# Patient Record
Sex: Female | Born: 1985 | Hispanic: No | Marital: Married | Smoking: Former smoker
Health system: Southern US, Community
[De-identification: ages and names within clinical notes are randomized; demographics above are authoritative.]

## PROBLEM LIST (undated history)

## (undated) ENCOUNTER — Inpatient Hospital Stay (HOSPITAL_COMMUNITY): Payer: Self-pay

## (undated) DIAGNOSIS — I7300 Raynaud's syndrome without gangrene: Secondary | ICD-10-CM

## (undated) DIAGNOSIS — E042 Nontoxic multinodular goiter: Secondary | ICD-10-CM

## (undated) DIAGNOSIS — G5603 Carpal tunnel syndrome, bilateral upper limbs: Secondary | ICD-10-CM

## (undated) DIAGNOSIS — O26613 Liver and biliary tract disorders in pregnancy, third trimester: Secondary | ICD-10-CM

## (undated) DIAGNOSIS — F418 Other specified anxiety disorders: Secondary | ICD-10-CM

## (undated) DIAGNOSIS — G43909 Migraine, unspecified, not intractable, without status migrainosus: Secondary | ICD-10-CM

## (undated) DIAGNOSIS — K831 Obstruction of bile duct: Secondary | ICD-10-CM

## (undated) DIAGNOSIS — K219 Gastro-esophageal reflux disease without esophagitis: Secondary | ICD-10-CM

## (undated) DIAGNOSIS — N946 Dysmenorrhea, unspecified: Secondary | ICD-10-CM

## (undated) DIAGNOSIS — K589 Irritable bowel syndrome without diarrhea: Secondary | ICD-10-CM

## (undated) DIAGNOSIS — O021 Missed abortion: Secondary | ICD-10-CM

## (undated) HISTORY — DX: Migraine, unspecified, not intractable, without status migrainosus: G43.909

## (undated) HISTORY — DX: Gastro-esophageal reflux disease without esophagitis: K21.9

## (undated) HISTORY — DX: Irritable bowel syndrome, unspecified: K58.9

## (undated) HISTORY — DX: Dysmenorrhea, unspecified: N94.6

## (undated) HISTORY — DX: Nontoxic multinodular goiter: E04.2

## (undated) HISTORY — DX: Obstruction of bile duct: K83.1

## (undated) HISTORY — DX: Liver and biliary tract disorders in pregnancy, third trimester: O26.613

## (undated) HISTORY — DX: Raynaud's syndrome without gangrene: I73.00

## (undated) HISTORY — PX: COLONOSCOPY: SHX174

## (undated) HISTORY — DX: Other specified anxiety disorders: F41.8

## (undated) HISTORY — DX: Missed abortion: O02.1

## (undated) HISTORY — PX: DG SELECTED HSG GDC ONLY: HXRAD357

## (undated) HISTORY — DX: Carpal tunnel syndrome, bilateral upper limbs: G56.03

## (undated) HISTORY — PX: WISDOM TOOTH EXTRACTION: SHX21

---
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
## 2007-06-16 ENCOUNTER — Emergency Department: Payer: Self-pay | Admitting: Emergency Medicine

## 2007-06-29 ENCOUNTER — Emergency Department: Payer: Self-pay | Admitting: Emergency Medicine

## 2011-09-21 ENCOUNTER — Encounter: Payer: Self-pay | Admitting: Gastroenterology

## 2011-09-21 ENCOUNTER — Ambulatory Visit (INDEPENDENT_AMBULATORY_CARE_PROVIDER_SITE_OTHER): Payer: Managed Care, Other (non HMO) | Admitting: Gastroenterology

## 2011-09-21 ENCOUNTER — Other Ambulatory Visit (INDEPENDENT_AMBULATORY_CARE_PROVIDER_SITE_OTHER): Payer: Managed Care, Other (non HMO)

## 2011-09-21 VITALS — BP 92/68 | HR 72 | Ht 62.0 in | Wt 124.0 lb

## 2011-09-21 DIAGNOSIS — K5900 Constipation, unspecified: Secondary | ICD-10-CM

## 2011-09-21 DIAGNOSIS — R197 Diarrhea, unspecified: Secondary | ICD-10-CM

## 2011-09-21 DIAGNOSIS — R1013 Epigastric pain: Secondary | ICD-10-CM | POA: Insufficient documentation

## 2011-09-21 LAB — CBC WITH DIFFERENTIAL/PLATELET
Basophils Absolute: 0 10*3/uL (ref 0.0–0.1)
Basophils Relative: 0.5 % (ref 0.0–3.0)
Eosinophils Absolute: 0 10*3/uL (ref 0.0–0.7)
Lymphocytes Relative: 41.7 % (ref 12.0–46.0)
MCHC: 32.7 g/dL (ref 30.0–36.0)
MCV: 83.1 fl (ref 78.0–100.0)
Monocytes Absolute: 0.3 10*3/uL (ref 0.1–1.0)
Neutro Abs: 2 10*3/uL (ref 1.4–7.7)
Neutrophils Relative %: 49.1 % (ref 43.0–77.0)
RBC: 4.95 Mil/uL (ref 3.87–5.11)
RDW: 13.3 % (ref 11.5–14.6)

## 2011-09-21 LAB — COMPREHENSIVE METABOLIC PANEL
AST: 17 U/L (ref 0–37)
Alkaline Phosphatase: 51 U/L (ref 39–117)
BUN: 14 mg/dL (ref 6–23)
Glucose, Bld: 83 mg/dL (ref 70–99)
Potassium: 4.1 mEq/L (ref 3.5–5.1)
Total Bilirubin: 0.8 mg/dL (ref 0.3–1.2)

## 2011-09-21 MED ORDER — NA SULFATE-K SULFATE-MG SULF 17.5-3.13-1.6 GM/177ML PO SOLN: 1.0000 | Freq: Once | ORAL | Status: DC

## 2011-09-21 MED ORDER — GLYCOPYRROLATE 2 MG PO TABS: 2.0000 mg | ORAL_TABLET | Freq: Two times a day (BID) | ORAL | Status: DC

## 2011-09-21 NOTE — Patient Instructions (Addendum)
You have been scheduled for a colonoscopy Separate instructions have been given You will go to the basement for labs today

## 2011-09-21 NOTE — Assessment & Plan Note (Signed)
The patient has a 4 month history of nausea, abdominal pain and intermittent constipation. Symptoms could be do to inflammatory bowel disease. It is noteworthy that she has no antecedent GI complaints. IBS and medication effect are other considerations.  Recommendations #1 check CBC, CRP and comprehensive metabolic profile #2 colonoscopy #3 begin Robinul Forte

## 2011-09-21 NOTE — Progress Notes (Signed)
History of Present Illness: Pleasant 26 year old white female referred at the request of Dr. Jarold Motto for evaluation of abdominal pain and diarrhea. For the past 4 months she has been complaining of intermittent diarrhea and diffuse abdominal pain in both her upper and lower abdomen. Diarrhea is often accompanied by pain although she may have pain in the absence of diarrhea. In between diarrheal episodes, which may occur weekly, she claims to be constipated. She has abdominal pain with constipation as well.  There is no history of melanoma or hematochezia. She complains of frequent nausea. She's been taking oral contraceptives and Celexa for the last year. She's noted stress at her job which began in March. She has no antecedent GI complaints.    Past Medical History  Diagnosis Date  . Anxiety and depression    Past Surgical History  Procedure Date  . Wisdom tooth extraction    family history includes Diabetes in her father; Heart disease in her father; and Rheum arthritis in her mother. Current Outpatient Prescriptions  Medication Sig Dispense Refill  . citalopram (CELEXA) 20 MG tablet Take 20 mg by mouth daily.      . norethindrone-ethinyl estradiol-iron (ESTROSTEP FE,TILIA FE,TRI-LEGEST FE) 1-20/1-30/1-35 MG-MCG tablet Take 1 tablet by mouth daily.       Allergies as of 09/21/2011  . (No Known Allergies)    reports that she has quit smoking. She has never used smokeless tobacco. She reports that she drinks alcohol. She reports that she does not use illicit drugs.     Review of Systems: Pertinent positive and negative review of systems were noted in the above HPI section. All other review of systems were otherwise negative.  Vital signs were reviewed in today's medical record Physical Exam: General: Well developed , well nourished, no acute distress Head: Normocephalic and atraumatic Eyes:  sclerae anicteric, EOMI Ears: Normal auditory acuity Mouth: No deformity or lesions Neck:  Supple, no masses or thyromegaly Lungs: Clear throughout to auscultation Heart: Regular rate and rhythm; no murmurs, rubs or bruits Abdomen: Soft, non tender and non distended. No masses, hepatosplenomegaly or hernias noted. Normal Bowel sounds Rectal:deferred Musculoskeletal: Symmetrical with no gross deformities  Skin: No lesions on visible extremities Pulses:  Normal pulses noted Extremities: No clubbing, cyanosis, edema or deformities noted Neurological: Alert oriented x 4, grossly nonfocal Cervical Nodes:  No significant cervical adenopathy Inguinal Nodes: No significant inguinal adenopathy Psychological:  Alert and cooperative. Normal mood and affect

## 2011-09-23 ENCOUNTER — Telehealth: Payer: Self-pay | Admitting: Gastroenterology

## 2011-09-23 NOTE — Telephone Encounter (Signed)
Spoke with pt and let her know her labs were normal.

## 2011-09-28 ENCOUNTER — Telehealth: Payer: Self-pay | Admitting: Gastroenterology

## 2011-09-28 DIAGNOSIS — R197 Diarrhea, unspecified: Secondary | ICD-10-CM

## 2011-09-28 NOTE — Telephone Encounter (Signed)
Labs in EPIC. Left a message for patient to call me. 

## 2011-09-28 NOTE — Telephone Encounter (Signed)
Let's get stool studies for Clostridium difficile by PCR, routine stool culture, ova, parasites, fecal leukocytes. She should be okay to take at least one or 2 Imodium a day as needed. She needs to call here symptoms worsen. We will get in touch with her when the stool tests are back.

## 2011-09-28 NOTE — Telephone Encounter (Signed)
Spoke with patient and gave her Dr. Christella Hartigan recommendations. She will pick up stool containers.

## 2011-09-28 NOTE — Telephone Encounter (Signed)
Patient saw Dr. [name] on 09/21/11 for 4 months episodes of diarrhea and lower abdominal pain. Patient given Robinul and colonoscopy scheduled on 11/03/11 ?IBS vis IBD. Last night, diarrhea x7, sharp knife like pain in lower abdomen. After having diarrhea, pain stopped. She states"I almost went to the ER because the pain was so bad." Patient concerned about waiting until 9/19 to be evaluated and wants to know if there is anything else she can take. Dr. [name] out of office. DOD- Dr. Christella Hartigan. Please, advise.

## 2011-10-04 ENCOUNTER — Telehealth: Payer: Self-pay | Admitting: Gastroenterology

## 2011-10-04 NOTE — Telephone Encounter (Signed)
Pt wanted to know what she could take for constipation. Pt instructed to try Miralax OTC up to 3 doses in one day to have a BM. Pt instructed to call us back if she had an further problems. Pt verbalized understanding.

## 2011-10-05 ENCOUNTER — Telehealth: Payer: Self-pay

## 2011-10-05 NOTE — Telephone Encounter (Signed)
[name] pt, last OV 09/21/11. Pt has been having problems with diarrhea and constipation. Spoke with pt yesterday and instructed her to take up to 3 doses of miralax in one day to have BM. Pt has called back and states she has not had a BM since Saturday. Pt states that her stomach is extremely bloated and she does not look normal. Dr. Marina Goodell as doc of the day please advise.

## 2011-10-05 NOTE — Telephone Encounter (Signed)
Pt aware.

## 2011-10-05 NOTE — Telephone Encounter (Signed)
She saw Dr. Ranee Gosselin 13. Reviewed. Colonoscopy planned. If she is having abdominal pain and vomiting, she needs to go to the ER. Otherwise, take one dose of MiraLax every 20 minutes until she has a bowel movement. For any further issues, contact the office.

## 2011-10-07 ENCOUNTER — Other Ambulatory Visit: Payer: Managed Care, Other (non HMO)

## 2011-10-07 ENCOUNTER — Other Ambulatory Visit: Payer: Self-pay | Admitting: *Deleted

## 2011-10-07 ENCOUNTER — Other Ambulatory Visit: Payer: Self-pay | Admitting: Gastroenterology

## 2011-10-07 DIAGNOSIS — R197 Diarrhea, unspecified: Secondary | ICD-10-CM

## 2011-10-07 DIAGNOSIS — R195 Other fecal abnormalities: Secondary | ICD-10-CM

## 2011-10-08 LAB — FECAL LACTOFERRIN, QUANT: Lactoferrin: NEGATIVE

## 2011-10-09 ENCOUNTER — Ambulatory Visit: Payer: Self-pay | Admitting: Medical

## 2011-10-09 LAB — CBC WITH DIFFERENTIAL/PLATELET
Basophil #: 0 10*3/uL (ref 0.0–0.1)
Basophil %: 0.6 %
HGB: 13 g/dL (ref 12.0–16.0)
MCH: 27.2 pg (ref 26.0–34.0)
MCV: 84 fL (ref 80–100)
Monocyte %: 10 %
Neutrophil #: 1.9 10*3/uL (ref 1.4–6.5)
Neutrophil %: 41.9 %
Platelet: 171 10*3/uL (ref 150–440)
RBC: 4.79 10*6/uL (ref 3.80–5.20)
WBC: 4.5 10*3/uL (ref 3.6–11.0)

## 2011-10-09 LAB — URINALYSIS, COMPLETE
Bilirubin,UR: NEGATIVE
Glucose,UR: NEGATIVE mg/dL (ref 0–75)
Ph: 7.5 (ref 4.5–8.0)
RBC,UR: >30 /HPF (ref 0–5)

## 2011-10-09 LAB — COMPREHENSIVE METABOLIC PANEL
Alkaline Phosphatase: 64 U/L (ref 50–136)
BUN: 12 mg/dL (ref 7–18)
Bilirubin,Total: 0.3 mg/dL (ref 0.2–1.0)
Calcium, Total: 8.4 mg/dL — ABNORMAL LOW (ref 8.5–10.1)
Chloride: 106 mmol/L (ref 98–107)
Co2: 29 mmol/L (ref 21–32)
Creatinine: 0.59 mg/dL — ABNORMAL LOW (ref 0.60–1.30)
EGFR (African American): >60
EGFR (Non-African Amer.): >60
Osmolality: 279 (ref 275–301)
Sodium: 140 mmol/L (ref 136–145)
Total Protein: 6.9 g/dL (ref 6.4–8.2)

## 2011-10-09 LAB — PREGNANCY, URINE: Pregnancy Test, Urine: NEGATIVE m[IU]/mL

## 2011-10-09 LAB — HCG, QUANTITATIVE, PREGNANCY: Beta Hcg, Quant.: <1 m[IU]/mL — ABNORMAL LOW

## 2011-10-09 LAB — LIPASE, BLOOD: Lipase: 103 U/L (ref 73–393)

## 2011-10-10 LAB — CLOSTRIDIUM DIFFICILE BY PCR: Toxigenic C. Difficile by PCR: NOT DETECTED

## 2011-10-10 LAB — STOOL CULTURE

## 2011-10-11 LAB — URINE CULTURE

## 2011-10-14 ENCOUNTER — Encounter: Payer: Managed Care, Other (non HMO) | Admitting: Gastroenterology

## 2011-11-03 ENCOUNTER — Encounter: Payer: Managed Care, Other (non HMO) | Admitting: Gastroenterology

## 2011-11-03 ENCOUNTER — Encounter: Payer: Self-pay | Admitting: Gastroenterology

## 2011-11-03 ENCOUNTER — Ambulatory Visit (AMBULATORY_SURGERY_CENTER): Payer: Managed Care, Other (non HMO) | Admitting: Gastroenterology

## 2011-11-03 VITALS — BP 104/53 | HR 84 | Temp 98.3°F | Resp 15 | Ht 62.0 in | Wt 124.0 lb

## 2011-11-03 DIAGNOSIS — K5900 Constipation, unspecified: Secondary | ICD-10-CM

## 2011-11-03 DIAGNOSIS — R1013 Epigastric pain: Secondary | ICD-10-CM

## 2011-11-03 DIAGNOSIS — R197 Diarrhea, unspecified: Secondary | ICD-10-CM

## 2011-11-03 HISTORY — PX: COLONOSCOPY: SHX174

## 2011-11-03 MED ORDER — HYOSCYAMINE SULFATE 0.125 MG SL SUBL: 0.2500 mg | SUBLINGUAL_TABLET | SUBLINGUAL | Status: DC | PRN

## 2011-11-03 MED ORDER — SODIUM CHLORIDE 0.9 % IV SOLN: 500.0000 mL | INTRAVENOUS | Status: DC

## 2011-11-03 NOTE — Op Note (Signed)
[facility] [address] [place], [address]   COLONOSCOPY PROCEDURE REPORT  PATIENT: Angela, [name]  MR#: [id number] BIRTHDATE: 1985/10/06 , 25  yrs. old GENDER: Female ENDOSCOPIST: Louis Meckel, MD REFERRED XB:JYNWGN Eloise Harman, M.D. PROCEDURE DATE:  11/03/2011 PROCEDURE:   Colonoscopy, diagnostic ASA CLASS:   Class I INDICATIONS: MEDICATIONS: MAC sedation, administered by CRNA, Fentanyl-Quick Pick, and Propofol (Diprivan) 300 mg IV  DESCRIPTION OF PROCEDURE:   After the risks benefits and alternatives of the procedure were thoroughly explained, informed consent was obtained.  A digital rectal exam revealed no abnormalities of the rectum.   The LB [id number]  endoscope was introduced through the anus and advanced to the terminal ileum which was intubated for a short distance. No adverse events experienced.   The quality of the prep was Suprep excellent  The instrument was then slowly withdrawn as the colon was fully examined.      COLON FINDINGS: The colonic mucosa appeared normal.  Retroflexed views revealed no abnormalities. The time to cecum=5 minutes 43 seconds.  Withdrawal time=5 minutes 36 seconds.  The scope was withdrawn and the procedure completed. COMPLICATIONS: There were no complications.  ENDOSCOPIC IMPRESSION: The colonic mucosa appeared normal  RECOMMENDATIONS: 1.  High fiber diet 2.  hyomax s.l.  as needed Office visit 1 month 3.  hyomax s.l.  as needed Office visit 1 month   eSigned:  Louis Meckel, MD 11/03/2011 1:58 PM   cc:

## 2011-11-03 NOTE — Progress Notes (Signed)
Patient did not experience any of the following events: a burn prior to discharge; a fall within the facility; wrong site/side/patient/procedure/implant event; or a hospital transfer or hospital admission upon discharge from the facility. (G8907) Patient did not have preoperative order for IV antibiotic SSI prophylaxis. (G8918)  

## 2011-11-03 NOTE — Patient Instructions (Addendum)
YOU HAD AN ENDOSCOPIC PROCEDURE TODAY AT THE [facility]: Refer to the procedure report that was given to you for any specific questions about what was found during the examination.  If the procedure report does not answer your questions, please call your gastroenterologist to clarify.  If you requested that your care partner not be given the details of your procedure findings, then the procedure report has been included in a sealed envelope for you to review at your convenience later.  YOU SHOULD EXPECT: Some feelings of bloating in the abdomen. Passage of more gas than usual.  Walking can help get rid of the air that was put into your GI tract during the procedure and reduce the bloating. If you had a lower endoscopy (such as a colonoscopy or flexible sigmoidoscopy) you may notice spotting of blood in your stool or on the toilet paper. If you underwent a bowel prep for your procedure, then you may not have a normal bowel movement for a few days.  DIET: Your first meal following the procedure should be a light meal and then it is ok to progress to your normal diet.  A half-sandwich or bowl of soup is an example of a good first meal.  Heavy or fried foods are harder to digest and may make you feel nauseous or bloated.  Likewise meals heavy in dairy and vegetables can cause extra gas to form and this can also increase the bloating.  Drink plenty of fluids but you should avoid alcoholic beverages for 24 hours.  ACTIVITY: Your care partner should take you home directly after the procedure.  You should plan to take it easy, moving slowly for the rest of the day.  You can resume normal activity the day after the procedure however you should NOT DRIVE or use heavy machinery for 24 hours (because of the sedation medicines used during the test).    SYMPTOMS TO REPORT IMMEDIATELY: A gastroenterologist can be reached at any hour.  During normal business hours, 8:30 AM to 5:00 PM Monday through Friday,  call [phone].  After hours and on weekends, please call the GI answering service at [phone] who will take a message and have the physician on call contact you.   Following lower endoscopy (colonoscopy or flexible sigmoidoscopy):  Excessive amounts of blood in the stool  Significant tenderness or worsening of abdominal pains  Swelling of the abdomen that is new, acute  Fever of 100F or higher  Following upper endoscopy (EGD)  Vomiting of blood or coffee ground material  New chest pain or pain under the shoulder blades  Painful or persistently difficult swallowing  New shortness of breath  Fever of 100F or higher  Black, tarry-looking stools  FOLLOW UP: If any biopsies were taken you will be contacted by phone or by letter within the next 1-3 weeks.  Call your gastroenterologist if you have not heard about the biopsies in 3 weeks.  Our staff will call the home number listed on your records the next business day following your procedure to check on you and address any questions or concerns that you may have at that time regarding the information given to you following your procedure. This is a courtesy call and so if there is no answer at the home number and we have not heard from you through the emergency physician on call, we will assume that you have returned to your regular daily activities without incident.  SIGNATURES/CONFIDENTIALITY: You and/or your care   partner have signed paperwork which will be entered into your electronic medical record.  These signatures attest to the fact that that the information above on your After Visit Summary has been reviewed and is understood.  Full responsibility of the confidentiality of this discharge information lies with you and/or your care-partner.  

## 2011-11-04 ENCOUNTER — Telehealth: Payer: Self-pay | Admitting: *Deleted

## 2011-11-04 NOTE — Telephone Encounter (Signed)
  Follow up Call-  Call back number 11/03/2011  Post procedure Call Back phone  # cell [phone]  Permission to leave phone message Yes     Patient questions:  Do you have a fever, pain , or abdominal swelling? no Pain Score  0 *  Have you tolerated food without any problems? yes  Have you been able to return to your normal activities? yes  Do you have any questions about your discharge instructions: Diet   no Medications  no Follow up visit  no  Do you have questions or concerns about your Care? no  Actions: * If pain score is 4 or above: No action needed, pain <4.

## 2011-12-05 ENCOUNTER — Ambulatory Visit: Payer: Managed Care, Other (non HMO) | Admitting: Gastroenterology

## 2012-01-04 ENCOUNTER — Ambulatory Visit: Payer: Managed Care, Other (non HMO) | Admitting: Gastroenterology

## 2012-04-27 ENCOUNTER — Ambulatory Visit: Payer: Self-pay | Admitting: Otolaryngology

## 2014-01-23 ENCOUNTER — Telehealth: Payer: Self-pay | Admitting: Gastroenterology

## 2014-01-23 ENCOUNTER — Ambulatory Visit: Payer: Managed Care, Other (non HMO) | Admitting: Gastroenterology

## 2014-01-23 ENCOUNTER — Encounter: Payer: Self-pay | Admitting: Gastroenterology

## 2014-01-23 NOTE — Telephone Encounter (Signed)
PATIENT WAS A NO SHOW 01/23/14 AND LETTER SENT

## 2014-02-26 ENCOUNTER — Encounter: Payer: Self-pay | Admitting: Adult Health

## 2014-02-26 ENCOUNTER — Ambulatory Visit (INDEPENDENT_AMBULATORY_CARE_PROVIDER_SITE_OTHER): Payer: BLUE CROSS/BLUE SHIELD | Admitting: Adult Health

## 2014-02-26 VITALS — BP 100/58 | Ht 62.0 in | Wt 121.5 lb

## 2014-02-26 DIAGNOSIS — Z3490 Encounter for supervision of normal pregnancy, unspecified, unspecified trimester: Secondary | ICD-10-CM

## 2014-02-26 DIAGNOSIS — Z3201 Encounter for pregnancy test, result positive: Secondary | ICD-10-CM

## 2014-02-26 LAB — POCT URINE PREGNANCY: PREG TEST UR: POSITIVE

## 2014-02-26 MED ORDER — PRENATAL PLUS 27-1 MG PO TABS: 1.0000 | ORAL_TABLET | Freq: Every day | ORAL | Status: DC

## 2014-02-26 NOTE — Progress Notes (Signed)
Subjective:     Patient ID: Angela [name], female   DOB: 08/08/1985, 29 y.o.   MRN: [id number]  HPI Angela [name] is a 29 year old white female, married in for UPT,spotted last week and has some cramps.  Review of Systems See HPI Reviewed past medical,surgical, social and family history. Reviewed medications and allergies.     Objective:   Physical Exam BP 100/58 mmHg  Ht 5\' 2"  (1.575 m)  Wt 121 lb 8 oz (55.112 kg)  BMI 22.22 kg/m2  LMP 01/23/2014   UPT+, about 4+6 weeks by LMP EDD 11/01/14, medicaid form given,probably won't apply for,she works at [employer] [place]Discussed early pregnancy symptoms.  Assessment:     Pregnant +UPT    Plan:     Rx prenatal plus #30 1 daily with 11 refills Return in 2 weeks for dating US Review handout on first trimester and given OB packet

## 2014-02-26 NOTE — Patient Instructions (Signed)
First Trimester of Pregnancy The first trimester of pregnancy is from week 1 until the end of week 12 (months 1 through 3). A week after a sperm fertilizes an egg, the egg will implant on the wall of the uterus. This embryo will begin to develop into a baby. Genes from you and your partner are forming the baby. The female genes determine whether the baby is a boy or a girl. At 6-8 weeks, the eyes and face are formed, and the heartbeat can be seen on ultrasound. At the end of 12 weeks, all the baby's organs are formed.  Now that you are pregnant, you will want to do everything you can to have a healthy baby. Two of the most important things are to get good prenatal care and to follow your health care provider's instructions. Prenatal care is all the medical care you receive before the baby's birth. This care will help prevent, find, and treat any problems during the pregnancy and childbirth. BODY CHANGES Your body goes through many changes during pregnancy. The changes vary from woman to woman.   You may gain or lose a couple of pounds at first.  You may feel sick to your stomach (nauseous) and throw up (vomit). If the vomiting is uncontrollable, call your health care provider.  You may tire easily.  You may develop headaches that can be relieved by medicines approved by your health care provider.  You may urinate more often. Painful urination may mean you have a bladder infection.  You may develop heartburn as a result of your pregnancy.  You may develop constipation because certain hormones are causing the muscles that push waste through your intestines to slow down.  You may develop hemorrhoids or swollen, bulging veins (varicose veins).  Your breasts may begin to grow larger and become tender. Your nipples may stick out more, and the tissue that surrounds them (areola) may become darker.  Your gums may bleed and may be sensitive to brushing and flossing.  Dark spots or blotches (chloasma,  mask of pregnancy) may develop on your face. This will likely fade after the baby is born.  Your menstrual periods will stop.  You may have a loss of appetite.  You may develop cravings for certain kinds of food.  You may have changes in your emotions from day to day, such as being excited to be pregnant or being concerned that something may go wrong with the pregnancy and baby.  You may have more vivid and strange dreams.  You may have changes in your hair. These can include thickening of your hair, rapid growth, and changes in texture. Some women also have hair loss during or after pregnancy, or hair that feels dry or thin. Your hair will most likely return to normal after your baby is born. WHAT TO EXPECT AT YOUR PRENATAL VISITS During a routine prenatal visit:  You will be weighed to make sure you and the baby are growing normally.  Your blood pressure will be taken.  Your abdomen will be measured to track your baby's growth.  The fetal heartbeat will be listened to starting around week 10 or 12 of your pregnancy.  Test results from any previous visits will be discussed. Your health care provider may ask you:  How you are feeling.  If you are feeling the baby move.  If you have had any abnormal symptoms, such as leaking fluid, bleeding, severe headaches, or abdominal cramping.  If you have any questions. Other tests   that may be performed during your first trimester include:  Blood tests to find your blood type and to check for the presence of any previous infections. They will also be used to check for low iron levels (anemia) and Rh antibodies. Later in the pregnancy, blood tests for diabetes will be done along with other tests if problems develop.  Urine tests to check for infections, diabetes, or protein in the urine.  An ultrasound to confirm the proper growth and development of the baby.  An amniocentesis to check for possible genetic problems.  Fetal screens for  spina bifida and Down syndrome.  You may need other tests to make sure you and the baby are doing well. HOME CARE INSTRUCTIONS  Medicines  Follow your health care provider's instructions regarding medicine use. Specific medicines may be either safe or unsafe to take during pregnancy.  Take your prenatal vitamins as directed.  If you develop constipation, try taking a stool softener if your health care provider approves. Diet  Eat regular, well-balanced meals. Choose a variety of foods, such as meat or vegetable-based protein, fish, milk and low-fat dairy products, vegetables, fruits, and whole grain breads and cereals. Your health care provider will help you determine the amount of weight gain that is right for you.  Avoid raw meat and uncooked cheese. These carry germs that can cause birth defects in the baby.  Eating four or five small meals rather than three large meals a day may help relieve nausea and vomiting. If you start to feel nauseous, eating a few soda crackers can be helpful. Drinking liquids between meals instead of during meals also seems to help nausea and vomiting.  If you develop constipation, eat more high-fiber foods, such as fresh vegetables or fruit and whole grains. Drink enough fluids to keep your urine clear or pale yellow. Activity and Exercise  Exercise only as directed by your health care provider. Exercising will help you:  Control your weight.  Stay in shape.  Be prepared for labor and delivery.  Experiencing pain or cramping in the lower abdomen or low back is a good sign that you should stop exercising. Check with your health care provider before continuing normal exercises.  Try to avoid standing for long periods of time. Move your legs often if you must stand in one place for a long time.  Avoid heavy lifting.  Wear low-heeled shoes, and practice good posture.  You may continue to have sex unless your health care provider directs you  otherwise. Relief of Pain or Discomfort  Wear a good support bra for breast tenderness.   Take warm sitz baths to soothe any pain or discomfort caused by hemorrhoids. Use hemorrhoid cream if your health care provider approves.   Rest with your legs elevated if you have leg cramps or low back pain.  If you develop varicose veins in your legs, wear support hose. Elevate your feet for 15 minutes, 3-4 times a day. Limit salt in your diet. Prenatal Care  Schedule your prenatal visits by the twelfth week of pregnancy. They are usually scheduled monthly at first, then more often in the last 2 months before delivery.  Write down your questions. Take them to your prenatal visits.  Keep all your prenatal visits as directed by your health care provider. Safety  Wear your seat belt at all times when driving.  Make a list of emergency phone numbers, including numbers for family, friends, the hospital, and police and fire departments. General Tips    Ask your health care provider for a referral to a local prenatal education class. Begin classes no later than at the beginning of month 6 of your pregnancy.  Ask for help if you have counseling or nutritional needs during pregnancy. Your health care provider can offer advice or refer you to specialists for help with various needs.  Do not use hot tubs, steam rooms, or saunas.  Do not douche or use tampons or scented sanitary pads.  Do not cross your legs for long periods of time.  Avoid cat litter boxes and soil used by cats. These carry germs that can cause birth defects in the baby and possibly loss of the fetus by miscarriage or stillbirth.  Avoid all smoking, herbs, alcohol, and medicines not prescribed by your health care provider. Chemicals in these affect the formation and growth of the baby.  Schedule a dentist appointment. At home, brush your teeth with a soft toothbrush and be gentle when you floss. SEEK MEDICAL CARE IF:   You have  dizziness.  You have mild pelvic cramps, pelvic pressure, or nagging pain in the abdominal area.  You have persistent nausea, vomiting, or diarrhea.  You have a bad smelling vaginal discharge.  You have pain with urination.  You notice increased swelling in your face, hands, legs, or ankles. SEEK IMMEDIATE MEDICAL CARE IF:   You have a fever.  You are leaking fluid from your vagina.  You have spotting or bleeding from your vagina.  You have severe abdominal cramping or pain.  You have rapid weight gain or loss.  You vomit blood or material that looks like coffee grounds.  You are exposed to [place] measles and have never had them.  You are exposed to fifth disease or chickenpox.  You develop a severe headache.  You have shortness of breath.  You have any kind of trauma, such as from a fall or a car accident. Document Released: 01/25/2001 Document Revised: 06/17/2013 Document Reviewed: 12/11/2012 [facility] Patient Information 2015 [employer] [place]. This information is not intended to replace advice given to you by your health care provider. Make sure you discuss any questions you have with your health care provider. Return in 2 weeks for Dating [place]

## 2014-03-10 ENCOUNTER — Other Ambulatory Visit: Payer: Self-pay | Admitting: Obstetrics & Gynecology

## 2014-03-10 DIAGNOSIS — O3680X0 Pregnancy with inconclusive fetal viability, not applicable or unspecified: Secondary | ICD-10-CM

## 2014-03-11 DIAGNOSIS — Z029 Encounter for administrative examinations, unspecified: Secondary | ICD-10-CM

## 2014-03-12 ENCOUNTER — Ambulatory Visit (INDEPENDENT_AMBULATORY_CARE_PROVIDER_SITE_OTHER): Payer: BLUE CROSS/BLUE SHIELD

## 2014-03-12 ENCOUNTER — Other Ambulatory Visit: Payer: Self-pay | Admitting: Obstetrics & Gynecology

## 2014-03-12 DIAGNOSIS — O3680X0 Pregnancy with inconclusive fetal viability, not applicable or unspecified: Secondary | ICD-10-CM

## 2014-03-12 NOTE — Progress Notes (Signed)
U/S-single IUP with +FCA noted, FHR- 77 & 68 bpm, CRL c/w 6+1wks EDD 11/04/2014, cx appears closed, bilateral adnexa appears WNL, +YS noted= 2.536mm, will reck FHR at new ob appt

## 2014-03-17 ENCOUNTER — Telehealth: Payer: Self-pay | Admitting: Women's Health

## 2014-03-17 NOTE — Telephone Encounter (Signed)
Pt states on Saturday she had some very light brown spotting x 2 but has not having since and no cramping. Pt informed can be normal to have brownish discharge early pregnancy continue to monitor if reoccurs call office back. Pt verbalized understanding.

## 2014-03-18 ENCOUNTER — Other Ambulatory Visit: Payer: Self-pay | Admitting: Obstetrics & Gynecology

## 2014-03-18 DIAGNOSIS — O3680X1 Pregnancy with inconclusive fetal viability, fetus 1: Secondary | ICD-10-CM

## 2014-03-20 ENCOUNTER — Other Ambulatory Visit: Payer: BLUE CROSS/BLUE SHIELD

## 2014-03-26 ENCOUNTER — Ambulatory Visit (INDEPENDENT_AMBULATORY_CARE_PROVIDER_SITE_OTHER): Payer: BLUE CROSS/BLUE SHIELD | Admitting: Women's Health

## 2014-03-26 ENCOUNTER — Encounter: Payer: Self-pay | Admitting: Women's Health

## 2014-03-26 ENCOUNTER — Ambulatory Visit (INDEPENDENT_AMBULATORY_CARE_PROVIDER_SITE_OTHER): Payer: BLUE CROSS/BLUE SHIELD

## 2014-03-26 ENCOUNTER — Telehealth: Payer: Self-pay | Admitting: Women's Health

## 2014-03-26 ENCOUNTER — Other Ambulatory Visit: Payer: Self-pay | Admitting: Obstetrics & Gynecology

## 2014-03-26 DIAGNOSIS — O3680X1 Pregnancy with inconclusive fetal viability, fetus 1: Secondary | ICD-10-CM

## 2014-03-26 DIAGNOSIS — O021 Missed abortion: Secondary | ICD-10-CM

## 2014-03-26 HISTORY — DX: Missed abortion: O02.1

## 2014-03-26 MED ORDER — HYDROCODONE-ACETAMINOPHEN 5-325 MG PO TABS: 1.0000 | ORAL_TABLET | ORAL | Status: DC | PRN

## 2014-03-26 MED ORDER — MISOPROSTOL 200 MCG PO TABS: 800.0000 ug | ORAL_TABLET | Freq: Once | ORAL | Status: DC

## 2014-03-26 NOTE — Telephone Encounter (Signed)
Pt called back, wants to go ahead and do cytotec. Cytotec 800mcg po x 1 w/ 1RF to repeat in 48hrs if needed. Rx vicodin 1-2 q 4hr prn pain. Switched to front to schedule f/u in 1wk. Reviewed reasons to seek care sooner.  Cheral MarkerKimberly R. Ryatt Corsino, CNM, WHNP-BC 03/26/2014 1:28 PM

## 2014-03-26 NOTE — Patient Instructions (Signed)
FACTS YOU SHOULD KNOW  About Early Pregnancy Loss  WHAT IS AN EARLY PREGNANCY LOSS? Once the egg is fertilized with the sperm and begins to develop, it attaches to the lining of the uterus. This early pregnancy tissue may not develop into an embryo (the beginning stage of a baby). Sometimes an embryo does develop but does not continue to grow. These problems can be seen on ultrasound.   MANAGEMNT OF EARLY PREGNANCY LOSS: About 4 out of 100 (0.25%) women will have a pregnancy loss in her lifetime.  One in five pregnancies is found to be an early pregnancy loss.  There are 3 ways to care for an early pregnancy loss:   (1) Surgery, (2) Medicine, (3) Waiting for you to pass the pregnancy on your own. The decision as to how to proceed after being diagnosed with and early pregnancy loss is an individual one.  The decision can be made only after appropriate counseling.  You need to weigh the pros and cons of the 3 choices. Then you can make the choice that works for you.  SURGERY (D&E) . Procedure over in 1 day . Requires being put to sleep . Bleeding may be light . Possible problems during surgery, including injury to womb(uterus) . Care provider has more control Medicine (CYTOTEC) . The complete procedure may take days to weeks . No Surgery . Bleeding may be heavy at times . There may be drug side effects . Patient has more control Waiting . You may choose to wait, in which case your own body may complete the passing of the abnormal early pregnancy on its own in about 2-4 weeks . Your bleeding may be heavy at times . There is a small possibility that you may need surgery if the bleeding is too much or not all of the pregnancy has passed.  CYTOTEC MANAGEMENT Prostaglandins (cytotec) are the most widely used drug for this purpose. They cause the uterus to cramp and contract. You will place the medicine yourself inside your vagina in the privacy of your home. Empting of the uterus should occur  within 3 days but the process may continue for several weeks. The bleeding may seem heavy at times.  INSTRUCTIONS: Take all 4 tablets of cytotec (800mcg total) at one time. This will cause a lot of cramping, you may have bleeding, and pass tissue, then the cramping and bleeding should get better. If you do not pass the tissue, then you can take 4 more tablets of cytotec (800mcg total) 48 hours after your first dose.  You will come back to have your blood drawn to make sure the pregnancy hormones are dropping in 1 week. Please call us if you have any questions.   POSSIBLE SIDE EFFECTS FROM CYTOTEC . Nausea  Vomiting . Diarrhea Fever . Chills  Hot Flashes Side effects  from the process of the early pregnancy loss include: . Cramping  Bleeding . Headaches  Dizziness RISKS: This is a low risk procedure. Less than 1 in 100 women has a complication. An incomplete passage of the early pregnancy may occur. Also, hemorrhage (heavy bleeding) could happen.  Rarely the pregnancy will not be passed completely. Excessively heavy bleeding may occur.  Your doctor may need to perform surgery to empty the uterus (D&E). Afterwards: Everybody will feel differently after the early pregnancy loss completion. You may have soreness or cramps for a day or two. You may have soreness or cramps for day or two.  You may have light   bleeding for up to 2 weeks. You may be as active as you feel like being. If you have any of the following problems you may call [facility] at [phone] or [facility]missions [facility] at [phone] if it is after hours. . If you have pain that does not get better with pain medication . Bleeding that soaks through 2 thick full-sized sanitary pads in an hour . Cramps that last longer than 2 days . Foul smelling discharge . Fever above 100.4 degrees F Even if you do not have any of these symptoms, you should have a follow-up exam to make sure you are healing properly. Your next normal period will  usually start again in 4-6 week after the loss. You can get pregnant soon after the loss, so use birth control right away. Finally: Make sure all your questions are answered before during and after any procedure. Follow up with medical care and family planning methods.      

## 2014-03-26 NOTE — Progress Notes (Signed)
U/S-single IUP NO FCA noted, CRL c/w 5+6 wks cx appears closed, bilateral adnexa appears WNL

## 2014-03-26 NOTE — Progress Notes (Signed)
   [facility] Visit  Patient name: Angela [name] Pro MRN [id number]  Date of birth: 12-21-1985  CC & HPI:  Angela [name] Maxon is a 29 y.o. G1P0 Caucasian female at 841w1d by early u/s presenting today for f/u u/s d/t FHR 77 & 68 @ 1566w1d. She was also to have new ob appt.  US revealed CRL 8956w6d and no FCA. Pt states she has felt very hormonal and nauseated during pregnancy and then everything stopped last week. Some slight cramping and had 1 spot of brownish blood last week, but nothing further. Thinks she may be O-.   Pertinent History Reviewed:  Medical & Surgical Hx:   Past Medical History  Diagnosis Date  . Anxiety and depression   . Allergy   . Anxiety   . Depression   . GERD (gastroesophageal reflux disease)   . Pregnant 02/26/2014   Past Surgical History  Procedure Laterality Date  . Wisdom tooth extraction    . Colonoscopy  11/03/11    [place]:colonic mucosa appeared normal   Medications: Reviewed & Updated - see associated section Social History: Reviewed -  reports that she has quit smoking. Her smoking use included Cigarettes. She smoked 0.25 packs per day. She has never used smokeless tobacco.  Objective Findings:  Vitals: LMP 01/23/2014  Physical Examination: General appearance - alert, crying   Today's u/s:  U/S-single IUP NO FCA noted, CRL [name]/w 5+6 wks cx appears closed, bilateral adnexa appears WNL  Assessment & Plan:  A:   3356w6d CRL Missed Ab  Unknown ABO, thinks she may be O- P:  Discussed options of expectant management vs. Cytotec, pt & partner want to think about it and will call back to let me know  BHCG, ABO today  Will discuss f/u when she calls back   Marge DuncansBooker, Tai Skelly Randall CNM, [facility]NP[facility]BC 03/26/2014 10:48 AM

## 2014-03-27 ENCOUNTER — Telehealth: Payer: Self-pay | Admitting: Adult Health

## 2014-03-27 LAB — ABO/RH: Rh Factor: POSITIVE

## 2014-03-27 LAB — HCG, QUANTITATIVE, PREGNANCY: hCG Quant: 16092 m[IU]/mL

## 2014-03-27 NOTE — Telephone Encounter (Signed)
Pt aware that she is O+ and [facility] and she took cytotec last night had bad cramps and diarrhea and is still bleeding. Rest, push fluids can take 1.5 norco if needed and alternate with advil, has appt next Wednesday for labs, call with any questions or concerns.

## 2014-04-02 ENCOUNTER — Encounter: Payer: Self-pay | Admitting: Women's Health

## 2014-04-02 ENCOUNTER — Ambulatory Visit (INDEPENDENT_AMBULATORY_CARE_PROVIDER_SITE_OTHER): Payer: BLUE CROSS/BLUE SHIELD | Admitting: Women's Health

## 2014-04-02 VITALS — BP 104/60 | Ht 62.0 in | Wt 123.0 lb

## 2014-04-02 DIAGNOSIS — O039 Complete or unspecified spontaneous abortion without complication: Secondary | ICD-10-CM

## 2014-04-02 NOTE — Patient Instructions (Signed)
No sex until bleeding stops Condoms x 3 months, then can try for pregnancy again Continue taking prenatal vitamins

## 2014-04-02 NOTE — Progress Notes (Signed)
Patient ID: Angela [name] Behney, female   DOB: 07-04-1985, 29 y.o.   MRN: [id number]   [facility]  Patient name: Angela [name] Munford MRN [id number]  Date of birth: 07-04-1985  CC & HPI:  Angela [name] Grewe is a 29 y.o. 521P0010 Caucasian female presenting today for f/u after being dx w/ missed ab w/ CRL 6613w1d last week. She took 800mcg cytotec on wed and had lots of cramps and few hours later bleeding started and lasted for a few days, on Sat she passed a large clot ~7cm, that she believes may have been the fetus/POC. Cramping has improved, bleeding is lessening.  HCG 2/10: 16,092 Does desire pregnancy in near future. Is still taking pnv. Does not smoke/drink. Last pap >3130yrs ago.   Pertinent History Reviewed:  Medical & Surgical Hx:   Past Medical History  Diagnosis Date  . Anxiety and depression   . Allergy   . Anxiety   . Depression   . GERD (gastroesophageal reflux disease)   . Pregnant 02/26/2014   Past Surgical History  Procedure Laterality Date  . Wisdom tooth extraction    . Colonoscopy  11/03/11    [place]:colonic mucosa appeared normal   Medications: Reviewed & Updated - see associated section Social History: Reviewed -  reports that she has quit smoking. Her smoking use included Cigarettes. She smoked 0.25 packs per day. She has never used smokeless tobacco.  Objective Findings:  Vitals: BP 104/60 mmHg  Ht 5\' 2"  (1.575 m)  Wt 123 lb (55.792 kg)  BMI 22.49 kg/m2  LMP 01/23/2014  Physical Examination: General appearance - alert, well appearing, and in no distress  No results found for this or any previous visit (from the past 24 hour(s)).   Assessment & Plan:  A:   Probable completed ab  O+  Desires future pregnancy  Needs pap smear P:  Check HCG today, if dropping appropriately, no further f/u needed  Continue pnv  No sex until bleeding completely stops, condoms x 3 months, then ok to start trying again for pregnancy   F/U 1 month for pap &  physical   Marge DuncansBooker, Brayam Boeke Randall CNM, [facility]NP[facility] 04/02/2014 9:55 AM

## 2014-04-03 ENCOUNTER — Telehealth: Payer: Self-pay | Admitting: Adult Health

## 2014-04-03 LAB — HCG, QUANTITATIVE, PREGNANCY: HCG QUANT: 485 m[IU]/mL

## 2014-04-03 NOTE — Telephone Encounter (Addendum)
[facility] 485 recheck in 2 weeks

## 2014-04-10 ENCOUNTER — Telehealth: Payer: Self-pay | Admitting: *Deleted

## 2014-04-10 NOTE — Telephone Encounter (Signed)
Per Cyril MourningJennifer Griffin, NP, pt will need an appt. Call transferred to front staff for an appt.

## 2014-04-14 ENCOUNTER — Telehealth: Payer: Self-pay | Admitting: *Deleted

## 2014-04-14 NOTE — Telephone Encounter (Signed)
Pt states had miscarriage 03/26/14 light bleeding x 2 weeks now having heavy bleeding with cramps, changing  pad every 3-4 hours. Per Cyril MourningJennifer Griffin, NP could be pt period, push fluids, OTC Motrin if not better tomorrow call our office back to be evaluated. Pt verbalized understanding.

## 2014-04-16 ENCOUNTER — Other Ambulatory Visit: Payer: Self-pay | Admitting: *Deleted

## 2014-04-16 ENCOUNTER — Ambulatory Visit: Payer: BLUE CROSS/BLUE SHIELD | Admitting: Adult Health

## 2014-04-16 DIAGNOSIS — O021 Missed abortion: Secondary | ICD-10-CM

## 2014-04-17 ENCOUNTER — Telehealth: Payer: Self-pay | Admitting: Adult Health

## 2014-04-17 LAB — SPECIMEN STATUS REPORT

## 2014-04-17 LAB — HCG, SERUM, QUALITATIVE: HCG, BETA SUBUNIT, QUAL, SERUM: POSITIVE m[IU]/mL — AB (ref <6)

## 2014-04-17 LAB — HCG, QUANTITATIVE, PREGNANCY: hCG Quant: 8 m[IU]/mL

## 2014-04-17 NOTE — Telephone Encounter (Signed)
Left message QHCG 8 recheck at F/U appt

## 2014-04-18 ENCOUNTER — Telehealth: Payer: Self-pay | Admitting: Adult Health

## 2014-04-18 NOTE — Telephone Encounter (Signed)
Pt states continues to have the vaginal bleeding from miscarriage 03/26/2014 changing pad q 3-4 hours, increase when up on her feet. Informed pt could be her period and offered her an appt to be seen 1 st of next week since she is concerned. Pt states will continue to monitor if no improvement will call our office back next week. Pt informed to take PNV and foods rich in iron. Pt verbalized understanding.

## 2014-04-22 LAB — BETA HCG QUANT (REF LAB): HCG QUANT: 8 m[IU]/mL

## 2014-04-22 LAB — SPECIMEN STATUS REPORT

## 2014-05-07 ENCOUNTER — Other Ambulatory Visit (HOSPITAL_COMMUNITY)
Admission: RE | Admit: 2014-05-07 | Discharge: 2014-05-07 | Disposition: A | Discharge status: Home / Self Care | Payer: BLUE CROSS/BLUE SHIELD | Source: Ambulatory Visit | Attending: Obstetrics & Gynecology | Admitting: Obstetrics & Gynecology

## 2014-05-07 ENCOUNTER — Encounter: Payer: Self-pay | Admitting: Women's Health

## 2014-05-07 ENCOUNTER — Ambulatory Visit (INDEPENDENT_AMBULATORY_CARE_PROVIDER_SITE_OTHER): Payer: BLUE CROSS/BLUE SHIELD | Admitting: Women's Health

## 2014-05-07 VITALS — BP 110/58 | HR 83 | Ht 62.0 in | Wt 117.0 lb

## 2014-05-07 DIAGNOSIS — K589 Irritable bowel syndrome without diarrhea: Secondary | ICD-10-CM | POA: Insufficient documentation

## 2014-05-07 DIAGNOSIS — Z01419 Encounter for gynecological examination (general) (routine) without abnormal findings: Secondary | ICD-10-CM | POA: Insufficient documentation

## 2014-05-07 DIAGNOSIS — F418 Other specified anxiety disorders: Secondary | ICD-10-CM | POA: Insufficient documentation

## 2014-05-07 DIAGNOSIS — O039 Complete or unspecified spontaneous abortion without complication: Secondary | ICD-10-CM

## 2014-05-07 DIAGNOSIS — I7300 Raynaud's syndrome without gangrene: Secondary | ICD-10-CM | POA: Insufficient documentation

## 2014-05-07 DIAGNOSIS — F17200 Nicotine dependence, unspecified, uncomplicated: Secondary | ICD-10-CM | POA: Insufficient documentation

## 2014-05-07 DIAGNOSIS — Z124 Encounter for screening for malignant neoplasm of cervix: Secondary | ICD-10-CM

## 2014-05-07 HISTORY — DX: Other specified anxiety disorders: F41.8

## 2014-05-07 NOTE — Progress Notes (Signed)
Patient ID: Angela [name], female   DOB: 08-08-1985, 29 y.o.   MRN: [id number] Subjective:   Angela [name] is a 29 y.o. G8P0010 Caucasian female here for a routine well-woman exam.  Patient's last menstrual period was 01/23/2014.  She just recently had a missed SAB in Feb, took cytotec, and stopped bleeding on 04/22/14.  Does want to try again for another pregnancy. Started having numbness in fingers and they were turning white- so initiated care w/ new PCP in [place], had labs, dx w/ Raynaud's. States her ana and rheumatoid factor were increased and vit d decreased. She has appt w/ rheumatologist on 4/27. Has been doing some research and thinks that may have been cause of SAB. Also has had some dep/anxiety- PCP started her on citalopram  daily, and is helping.  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  Past Medical History Past Medical History  Diagnosis Date  . Anxiety and depression   . Allergy   . Anxiety   . Depression   . GERD (gastroesophageal reflux disease)   . Pregnant 02/26/2014  . Raynaud disease     Past Surgical History Past Surgical History  Procedure Laterality Date  . Wisdom tooth extraction    . Colonoscopy  11/03/11    [place]:colonic mucosa appeared normal    Gynecologic History G1P0010  Patient's last menstrual period was 01/23/2014. Contraception: condoms Last Pap: 'few years'. Results were: normal Last  mammogram: never. Results were: n/a Last TCS: 2-30yrs ago, normal, dx w/ IBS  Obstetric History OB History  Gravida Para Term Preterm AB SAB TAB Ectopic Multiple Living  # Outcome Date GA Lbr Len/2nd Weight Sex Delivery Anes PTL Lv  1 SAB 03/26/14 [redacted]w[redacted]d             Current Medications Current Outpatient Prescriptions on File Prior to Visit  Medication Sig Dispense Refill  . ibuprofen (ADVIL,MOTRIN) 200 MG tablet Take 200 mg by mouth every 6 (six) hours as needed.    . prenatal vitamin w/FE, FA (PRENATAL 1 + 1) 27-1 MG TABS tablet Take 1 tablet by mouth daily at 12 noon. 30 each 11  . HYDROcodone-acetaminophen (NORCO/VICODIN) 5-325 MG per tablet Take 1-2 tablets by mouth every 4 (four) hours as needed for moderate pain or severe pain. (Patient not taking: Reported on 05/07/2014) 15 tablet 0  . misoprostol (CYTOTEC) 200 MCG tablet Take 4 tablets (800 mcg total) by mouth once. (Patient not taking: Reported on 04/02/2014) 4 tablet 1   No current facility-administered medications on file prior to visit.    Review of Systems Patient denies any headaches, blurred vision, shortness of breath, chest pain, abdominal pain, problems with bowel movements, urination, or intercourse.  Objective:  BP 110/58 mmHg  Pulse 83  Ht  (1.575 m)  Wt 117 lb (53.071 kg)  BMI 21.39 kg/m2  LMP 01/23/2014 Physical Exam  General:  Well developed, well nourished,  no acute distress. She is alert and oriented x3. Skin:  Warm and dry Neck:  Midline trachea, no thyromegaly or nodules Cardiovascular: Regular rate and rhythm, no murmur heard Lungs:  Effort normal, all lung fields clear to auscultation bilaterally Breasts:  No dominant palpable mass, retraction, or nipple discharge Abdomen:  Soft, non tender, no hepatosplenomegaly or masses Pelvic:  External genitalia is normal in appearance.  The vagina is normal in appearance. The cervix is bulbous, no CMT.  Thin prep pap is done w/ reflex HR  HPV cotesting. Uterus is felt to be normal size, shape, and contour.  No adnexal masses or tenderness noted. Extremities:  No swelling or varicosities noted Psych:  She has a normal mood and affect  Assessment:   Healthy well-woman exam Recent SAB Recently dx w/ Raynaud's H/O IBS Depression/anxiety managed by PCP Smoker  Plan:  Condoms for now, would wait until after sees rheumatologist to see if can regulate labs/sx before trying again for pregnancy Continue pnv Stop smoking HCG today to make sure levels are back to normal F/U [age]yr for physical, or sooner if needed Mammogram @29yo  or sooner if problems Colonoscopy @29yo  or sooner if problems  Angela [name], Angela [name] CNM, [facility]NP[facility]BC 05/07/2014 2:13 PM

## 2014-05-08 LAB — BETA HCG QUANT (REF LAB): hCG Quant: <1 m[IU]/mL

## 2014-05-12 LAB — CYTOLOGY - PAP

## 2014-05-13 ENCOUNTER — Telehealth: Payer: Self-pay | Admitting: *Deleted

## 2014-05-13 NOTE — Telephone Encounter (Signed)
Pt aware of results 

## 2014-05-26 DIAGNOSIS — G43909 Migraine, unspecified, not intractable, without status migrainosus: Secondary | ICD-10-CM | POA: Insufficient documentation

## 2014-11-12 ENCOUNTER — Telehealth: Payer: Self-pay | Admitting: Women's Health

## 2014-11-12 NOTE — Telephone Encounter (Signed)
Pt c/o irregular periods, cramping and light spotting 1 week prior to actual period. Pt states also has IBS had a BM and had bleeding from vaginal. Pt states these are just recent symptoms x 3 months. Pt states had MAB 9 months ago. Pt given an appt for evaluation on 11/13/2014.

## 2014-11-13 ENCOUNTER — Ambulatory Visit (INDEPENDENT_AMBULATORY_CARE_PROVIDER_SITE_OTHER): Payer: BLUE CROSS/BLUE SHIELD | Admitting: Adult Health

## 2014-11-13 ENCOUNTER — Encounter: Payer: Self-pay | Admitting: Adult Health

## 2014-11-13 VITALS — BP 100/60 | HR 72 | Ht 62.0 in | Wt 116.5 lb

## 2014-11-13 DIAGNOSIS — N940 Mittelschmerz: Secondary | ICD-10-CM

## 2014-11-13 DIAGNOSIS — Z3202 Encounter for pregnancy test, result negative: Secondary | ICD-10-CM

## 2014-11-13 DIAGNOSIS — N946 Dysmenorrhea, unspecified: Secondary | ICD-10-CM | POA: Diagnosis not present

## 2014-11-13 HISTORY — DX: Dysmenorrhea, unspecified: N94.6

## 2014-11-13 LAB — POCT URINE PREGNANCY: Preg Test, Ur: NEGATIVE

## 2014-11-13 NOTE — Patient Instructions (Signed)
Keep period calendar Try different positions Try motrin  Follow up prn

## 2014-11-13 NOTE — Progress Notes (Signed)
Subjective:     Patient ID: Angela [name], female   DOB: 1985-07-04, 29 y.o.   MRN: [id number]  HPI Angela [name] is a 29 year old white female, G1PO, in complaining of cramping before period and some spotting before period for last 3 months and in August had IBS/D and had vaginal bleeding twice then started period later that day.She says she has painful ovulation and it hurts to pee and have BM and sex can hurt too, and usually it is more to the right.She wonders if she has endometriosis.Periods are more irregular by 2-3 days of starting time.She is on celexa for anxiety and depression.  Review of Systems Patient denies any headaches, hearing loss, fatigue, blurred vision, shortness of breath, chest pain, problems with bowel movements, urination, or intercourse. No joint pain or mood swings.See HPI for positives.  Reviewed past medical,surgical, social and family history. Reviewed medications and allergies.     Objective:   Physical Exam BP 100/60 mmHg  Pulse 72  Ht  (1.575 m)  Wt 116 lb 8 oz (52.844 kg)  BMI 21.30 kg/m2  LMP 11/12/2014  Breastfeeding? No UPT negative, Skin warm and dry.Pelvic: external genitalia is normal in appearance no lesions, vagina: period blood,urethra has no lesions or masses noted, cervix:smooth, negative CMT, uterus: normal size, shape and contour, non tender, no masses felt, adnexa: no masses or tenderness noted. Bladder is non tender and no masses felt.Discussed that only way to diagnosis endometriosis is with laparoscopic surgery, but could try OCs, to see if helps.  She declines OCs, and I discussed with Dr Despina Hidden and he said could try Toradol and she declines that too.She is Ok if gets pregnant, she is trying to stop smoking.    Assessment:    Ovulation pain  Painful periods    Plan:    Keep period calendar of spotting, pain, period Use motrin prn pain Change positions with sex Follow up prn

## 2015-01-27 ENCOUNTER — Ambulatory Visit (INDEPENDENT_AMBULATORY_CARE_PROVIDER_SITE_OTHER): Payer: BLUE CROSS/BLUE SHIELD | Admitting: Obstetrics and Gynecology

## 2015-01-27 ENCOUNTER — Encounter: Payer: Self-pay | Admitting: Obstetrics and Gynecology

## 2015-01-27 VITALS — BP 120/74 | Ht 62.0 in | Wt 118.0 lb

## 2015-01-27 DIAGNOSIS — L292 Pruritus vulvae: Secondary | ICD-10-CM | POA: Insufficient documentation

## 2015-01-27 MED ORDER — FLUCONAZOLE 150 MG PO TABS: 150.0000 mg | ORAL_TABLET | Freq: Once | ORAL | Status: DC

## 2015-01-27 MED ORDER — TERCONAZOLE 0.4 % VA CREA: 1.0000 | TOPICAL_CREAM | Freq: Every day | VAGINAL | Status: DC

## 2015-01-27 NOTE — Progress Notes (Signed)
Patient ID: Angela [name], female   DOB: [birth year], 29 y.o.   MRN: [id number] Pt here today for possible yeast infection. Pt states that she has had burning and itching for the past couple days. Pt states that she has used monistat but wanted to make sure everything was ok.

## 2015-01-27 NOTE — Progress Notes (Signed)
Patient ID: Angela [name] Desilets, female   DOB: 08/22/1985, 29 y.o.   MRN: [id number]   [facility]  Patient name: Angela [name] MRN [id number]  Date of birth: 08/22/1985  CC & HPI:  Angela [name] [name] is [name] 29 y.o. female presenting today for vulvar itching, and   ROS:  Husband also having red dots on penis after recent sex,   Pertinent History Reviewed:   Reviewed: Significant for vaginal d/c preceding period, now on menses day 3 Medical         Past Medical History  Diagnosis Date  . Anxiety and depression   . Allergy   . GERD (gastroesophageal reflux disease)   . Pregnant 02/26/2014  . Raynaud disease   . IBS (irritable bowel syndrome)   . Miscarriage within last 12 months 03/2014  . Migraines   . Depression   . Anxiety   . Multiple thyroid nodules   . Painful menstrual periods 11/13/2014                              Surgical Hx:    Past Surgical History  Procedure Laterality Date  . Wisdom tooth extraction    . Colonoscopy  11/03/11    [place]:colonic mucosa appeared normal   Medications: Reviewed & Updated - see associated section                       Current outpatient prescriptions:  .  citalopram (CELEXA) 20 MG tablet, Take 20 mg by mouth daily., Disp: , Rfl:  .  Prenatal Vit-Fe Fumarate-FA (PRENATAL MULTIVITAMIN) TABS tablet, Take 1 tablet by mouth daily at 12 noon., Disp: , Rfl:    Social History: Reviewed -  reports that she has been smoking Cigarettes.  She has [name] 2.5 pack-year smoking history. She has never used smokeless tobacco.  Objective Findings:  Vitals: Blood pressure 120/74, height 5\' 2"  (1.575 m), weight 118 lb (53.524 kg), last menstrual period 01/24/2015.  Physical Examination: General appearance - alert, well appearing, and in no distress, oriented to person, place, and time and normal appearing weight Abdomen - soft, nontender, nondistended, no masses or organomegaly no rebound tenderness noted Pelvic - normal external  genitalia, vulva, vagina, cervix, uterus and adnexa, VULVA: normal appearing vulva with no masses, tenderness or lesions, VAGINA: normal appearing vagina with normal color and discharge, no lesions, on MENSES , CERVIX: normal appearing cervix without discharge or lesions, UTERUS: uterus is normal size, shape, consistency and nontender, ADNEXA: normal adnexa in size, nontender and no masses, RECTAL: rectal exam not indicated, no palpable internal organs, WET MOUNT done - results: RBC preclude adeq exam. No definite yeast seen   Assessment & Plan:   [name]:  1. Yeast by hx 2.  On menses, suboptimal eval  P:  1. 1. Empiric tx with Monistat 7  Pt and partner

## 2015-02-23 ENCOUNTER — Encounter: Payer: Self-pay | Admitting: Adult Health

## 2015-02-23 ENCOUNTER — Ambulatory Visit (INDEPENDENT_AMBULATORY_CARE_PROVIDER_SITE_OTHER): Payer: BLUE CROSS/BLUE SHIELD | Admitting: Adult Health

## 2015-02-23 VITALS — BP 110/72 | HR 78 | Ht 62.0 in | Wt 119.0 lb

## 2015-02-23 DIAGNOSIS — N949 Unspecified condition associated with female genital organs and menstrual cycle: Secondary | ICD-10-CM | POA: Insufficient documentation

## 2015-02-23 LAB — POCT WET PREP (WET MOUNT)

## 2015-02-23 NOTE — Progress Notes (Signed)
Subjective:     Patient ID: Angela [name], female   DOB: 11-28-1985, 30 y.o.   MRN: [id number]  HPI Angela [name] is a 30 year old white female,married, in complaining of vaginal burning during and after sex and may swell, was treated in December for yeast. They have used different soaps, and husband has changed jobs, he is sheet rocking now.   Review of Systems Patient denies any headaches, hearing loss, fatigue, blurred vision, shortness of breath, chest pain, abdominal pain, problems with bowel movements, urination. No joint pain or mood swings.See HPI for positives.  Reviewed past medical,surgical, social and family history. Reviewed medications and allergies.     Objective:   Physical Exam BP 110/72 mmHg  Pulse 78  Ht 5\' 2"  (1.575 m)  Wt 119 lb (53.978 kg)  BMI 21.76 kg/m2  LMP 02/15/2015 Skin warm and dry.Pelvic: external genitalia is normal in appearance no lesions, vagina: white discharge without odor,urethra has no lesions or masses noted, cervix:smooth and bulbous, uterus: normal size, shape and contour, non tender, no masses felt, adnexa: no masses or tenderness noted. Bladder is non tender and no masses felt. Wet prep: +WBCs.   Face time 15 minutes with 50% counseling on using same soap and detergent and have husband shower before sex.No sex for 1 week and try luvena or rephresh to get PH balanced.   Assessment:     Vaginal burning    Plan:   Try luvena Get husband to use same soap and detergent as you and to shower before sex Follow up prn

## 2015-02-23 NOTE — Patient Instructions (Signed)
Get husband to use same soap and detergent as you and to shower before sex, try luvena  Follow up prn

## 2015-03-16 ENCOUNTER — Ambulatory Visit: Payer: BLUE CROSS/BLUE SHIELD | Admitting: Adult Health

## 2015-05-08 ENCOUNTER — Telehealth: Payer: Self-pay

## 2015-05-08 NOTE — Telephone Encounter (Signed)
Got a refill request for patient's citalopram but patient has not been seen since May 2016. Tried to call and schedule patient an appointment with Dr. Laural BenesJohnson before we send in a refill. I left a voicemail asking for the patient to please return my call. I will route to Dr. Laural BenesJohnson so she knows what is going on.

## 2015-05-11 NOTE — Telephone Encounter (Signed)
Called and left patient a voicemail asking for her to please return my call to schedule a f/u with Dr. Laural BenesJohnson.

## 2015-05-12 NOTE — Telephone Encounter (Signed)
Called and left patient a voicemail asking for her to please return my call. This is the 3rd attempt at reaching the patient so I will send a letter.  

## 2015-05-14 ENCOUNTER — Ambulatory Visit (INDEPENDENT_AMBULATORY_CARE_PROVIDER_SITE_OTHER): Payer: BLUE CROSS/BLUE SHIELD | Admitting: Family Medicine

## 2015-05-14 ENCOUNTER — Encounter: Payer: Self-pay | Admitting: Family Medicine

## 2015-05-14 VITALS — BP 106/72 | HR 79 | Temp 98.2°F | Ht 62.3 in | Wt 121.0 lb

## 2015-05-14 DIAGNOSIS — F418 Other specified anxiety disorders: Secondary | ICD-10-CM | POA: Diagnosis not present

## 2015-05-14 DIAGNOSIS — R42 Dizziness and giddiness: Secondary | ICD-10-CM

## 2015-05-14 MED ORDER — CITALOPRAM HYDROBROMIDE 20 MG PO TABS: 20.0000 mg | ORAL_TABLET | Freq: Every day | ORAL | Status: DC

## 2015-05-14 NOTE — Progress Notes (Signed)
BP 106/72 mmHg  Pulse 79  Temp(Src) 98.2 F (36.8 C)  Ht 5' 2.3" (1.582 m)  Wt 121 lb (54.885 kg)  BMI 21.93 kg/m2  SpO2 99%  LMP 05/01/2015 (Exact Date)   Subjective:    Patient ID: Angela [name], female    DOB: 1985/08/22, 30 y.o.   MRN: [id number]  HPI: Angela [name] is a 30 y.o. female  Chief Complaint  Patient presents with  . Depression  . Dizziness    Patient states that she will be so dizzy that she will become naseus.    DEPRESSION- missed 2 doses of her citalopram for 2 days and felt really dizzy and nauseous Mood status: stable Satisfied with current treatment?: yes Symptom severity: mild  Duration of current treatment : chronic Side effects: no Medication compliance: good compliance Psychotherapy/counseling: no  Depressed mood: yes Anxious mood: no Anhedonia: no Significant weight loss or gain: no Insomnia: no  Fatigue: yes Feelings of worthlessness or guilt: no Impaired concentration/indecisiveness: no Suicidal ideations: no Hopelessness: no Crying spells: no Depression screen PHQ 2/9 05/14/2015  Decreased Interest 0  Down, Depressed, Hopeless 2  PHQ - 2 Score 2  Altered sleeping 1  Tired, decreased energy 1  Change in appetite 0  Feeling bad or failure about yourself  0  Trouble concentrating 1  Moving slowly or fidgety/restless 0  Suicidal thoughts 0  PHQ-9 Score 5  Difficult doing work/chores Not difficult at all   GAD 7 : Generalized Anxiety Score 05/14/2015  Nervous, Anxious, on Edge 0  Control/stop worrying 0  Worry too much - different things 0  Trouble relaxing 0  Restless 0  Easily annoyed or irritable 1  Afraid - awful might happen 0  Total GAD 7 Score 1  Anxiety Difficulty Not difficult at all   DIZZINESS Duration: 2 days, but off and on Description of symptoms: room spinning Duration of episode: hours Dizziness frequency: recurrent Provoking factors: head movements Aggravating factors:  Head movements Triggered by  rolling over in bed: yes Triggered by bending over: yes Aggravated by head movement: yes Aggravated by exertion, coughing, loud noises: no Recent head injury: no Recent or current viral symptoms: no History of vasovagal episodes: no Nausea: yes Vomiting: no Tinnitus: no Hearing loss: no Aural fullness: no Headache: no Photophobia/phonophobia: no Unsteady gait: no Postural instability: no Diplopia, dysarthria, dysphagia or weakness: no Related to exertion: no Pallor: no Diaphoresis: no Dyspnea: no Chest pain: no  Relevant past medical, surgical, family and social history reviewed and updated as indicated. Interim medical history since our last visit reviewed. Allergies and medications reviewed and updated.  Review of Systems  Constitutional: Negative.   HENT: Negative.   Respiratory: Negative.   Cardiovascular: Negative.   Neurological: Positive for dizziness. Negative for tremors, seizures, syncope, facial asymmetry, speech difficulty, weakness, light-headedness, numbness and headaches.    Per HPI unless specifically indicated above     Objective:    BP 106/72 mmHg  Pulse 79  Temp(Src) 98.2 F (36.8 C)  Ht 5' 2.3" (1.582 m)  Wt 121 lb (54.885 kg)  BMI 21.93 kg/m2  SpO2 99%  LMP 05/01/2015 (Exact Date)  Wt Readings from Last 3 Encounters:  05/14/15 121 lb (54.885 kg)  02/23/15 119 lb (53.978 kg)  01/27/15 118 lb (53.524 kg)    Physical Exam  Constitutional: She is oriented to person, place, and time. She appears well-developed and well-nourished. No distress.  HENT:  Head: Normocephalic and atraumatic.  Right Ear: Hearing  and external ear normal.  Left Ear: Hearing and external ear normal.  Nose: Nose normal.  Mouth/Throat: Oropharynx is clear and moist. No oropharyngeal exudate.  Eyes: Conjunctivae, EOM and lids are normal. Pupils are equal, round, and reactive to light. Right eye exhibits no discharge. Left eye exhibits no discharge. No scleral icterus.   Neck: Normal range of motion. Neck supple. No JVD present. No tracheal deviation present. No thyromegaly present.  Cardiovascular: Normal rate, regular rhythm, normal heart sounds and intact distal pulses.  Exam reveals no gallop and no friction rub.   No murmur heard. Pulmonary/Chest: Effort normal and breath sounds normal. No stridor. No respiratory distress. She has no wheezes. She has no rales. She exhibits no tenderness.  Musculoskeletal: Normal range of motion.  Lymphadenopathy:    She has cervical adenopathy.  Neurological: She is alert and oriented to person, place, and time. She has normal reflexes. She displays normal reflexes. No cranial nerve deficit. She exhibits normal muscle tone. Coordination normal.  Skin: Skin is warm, dry and intact. No rash noted. She is not diaphoretic. No erythema. No pallor.  Psychiatric: She has a normal mood and affect. Her speech is normal and behavior is normal. Judgment and thought content normal. Cognition and memory are normal.  Nursing note and vitals reviewed.      Assessment & Plan:   Problem List Items Addressed This Visit      Other   Depression with anxiety - Primary    Under good control. Continue current regimen. Continue to monitor. Refill given today.       Other Visit Diagnoses    Vertigo        No nystagumus. Has resolved now. Conitnue to monitor. Call if getting worse. Epley's manuver given to patient today.        Follow up plan: Return in about 6 months (around 11/14/2015) for physical.

## 2015-05-14 NOTE — Assessment & Plan Note (Signed)
Under good control. Continue current regimen. Continue to monitor. Refill given today. 

## 2015-06-05 DIAGNOSIS — N979 Female infertility, unspecified: Secondary | ICD-10-CM | POA: Diagnosis not present

## 2015-06-05 DIAGNOSIS — Z01419 Encounter for gynecological examination (general) (routine) without abnormal findings: Secondary | ICD-10-CM | POA: Diagnosis not present

## 2015-06-05 DIAGNOSIS — Z720 Tobacco use: Secondary | ICD-10-CM | POA: Diagnosis not present

## 2015-06-10 ENCOUNTER — Telehealth: Payer: Self-pay | Admitting: Family Medicine

## 2015-06-10 NOTE — Telephone Encounter (Signed)
Pt called is leaving to go on a cruise soon. Would like to know if something can be sent to the pharmacy for her for motion sickness. Pharm is [facility] in [place], [place]. Thanks.

## 2015-06-11 MED ORDER — SCOPOLAMINE 1 MG/3DAYS TD PT72: 1.0000 | MEDICATED_PATCH | TRANSDERMAL | Status: DC

## 2015-06-11 NOTE — Telephone Encounter (Signed)
Rx sent to her pharmacy 

## 2015-07-05 DIAGNOSIS — R21 Rash and other nonspecific skin eruption: Secondary | ICD-10-CM | POA: Diagnosis not present

## 2015-09-07 DIAGNOSIS — N979 Female infertility, unspecified: Secondary | ICD-10-CM | POA: Diagnosis not present

## 2015-10-02 DIAGNOSIS — N979 Female infertility, unspecified: Secondary | ICD-10-CM | POA: Diagnosis not present

## 2015-10-26 DIAGNOSIS — N979 Female infertility, unspecified: Secondary | ICD-10-CM | POA: Diagnosis not present

## 2015-11-09 ENCOUNTER — Encounter (INDEPENDENT_AMBULATORY_CARE_PROVIDER_SITE_OTHER): Payer: Self-pay

## 2015-11-18 ENCOUNTER — Other Ambulatory Visit: Payer: Self-pay | Admitting: Family Medicine

## 2015-11-18 ENCOUNTER — Ambulatory Visit (INDEPENDENT_AMBULATORY_CARE_PROVIDER_SITE_OTHER): Payer: BLUE CROSS/BLUE SHIELD | Admitting: Family Medicine

## 2015-11-18 ENCOUNTER — Encounter: Payer: Self-pay | Admitting: Family Medicine

## 2015-11-18 VITALS — BP 106/67 | HR 68 | Temp 99.2°F | Ht 63.0 in | Wt 126.4 lb

## 2015-11-18 DIAGNOSIS — Z0000 Encounter for general adult medical examination without abnormal findings: Secondary | ICD-10-CM

## 2015-11-18 DIAGNOSIS — Z13220 Encounter for screening for lipoid disorders: Secondary | ICD-10-CM | POA: Diagnosis not present

## 2015-11-18 DIAGNOSIS — Z23 Encounter for immunization: Secondary | ICD-10-CM | POA: Diagnosis not present

## 2015-11-18 DIAGNOSIS — G5603 Carpal tunnel syndrome, bilateral upper limbs: Secondary | ICD-10-CM

## 2015-11-18 DIAGNOSIS — F418 Other specified anxiety disorders: Secondary | ICD-10-CM | POA: Diagnosis not present

## 2015-11-18 HISTORY — DX: Carpal tunnel syndrome, bilateral upper limbs: G56.03

## 2015-11-18 LAB — UA/M W/RFLX CULTURE, ROUTINE
BILIRUBIN UA: NEGATIVE
GLUCOSE, UA: NEGATIVE
KETONES UA: NEGATIVE
Leukocytes, UA: NEGATIVE
Nitrite, UA: NEGATIVE
Protein, UA: NEGATIVE
RBC UA: NEGATIVE
SPEC GRAV UA: 1.02 (ref 1.005–1.030)
UUROB: 0.2 mg/dL (ref 0.2–1.0)
pH, UA: 6.5 (ref 5.0–7.5)

## 2015-11-18 MED ORDER — CITALOPRAM HYDROBROMIDE 20 MG PO TABS: 20.0000 mg | ORAL_TABLET | Freq: Every day | ORAL | 1 refills | Status: DC

## 2015-11-18 NOTE — Progress Notes (Signed)
BP 106/67 (BP Location: Left Arm, Patient Position: Sitting, Cuff Size: Normal)   Pulse 68   Temp 99.2 F (37.3 C)   Ht 5\' 3"  (1.6 m)   Wt 126 lb 6.4 oz (57.3 kg)   LMP 10/30/2015 (Exact Date)   SpO2 99%   BMI 22.39 kg/m    Subjective:    Patient ID: Angela [name], female    DOB: [birth year], 30 y.o.   MRN: [id number]  HPI: Angela [name] is a 30 y.o. female presenting on 11/18/2015 for comprehensive medical examination. Current medical complaints include:  DEPRESSION Mood status: better Satisfied with current treatment?: yes Symptom severity: mild  Duration of current treatment : months Side effects: yes- sweats Medication compliance: excellent compliance Psychotherapy/counseling: no  Previous psychiatric medications: celexa Depressed mood: yes Anxious mood: no Anhedonia: no Significant weight loss or gain: no Insomnia: no  Fatigue: yes Feelings of worthlessness or guilt: no Impaired concentration/indecisiveness: yes Suicidal ideations: no Hopelessness: no Crying spells: no Depression screen [facility] 2/9 11/18/2015 05/14/2015  Decreased Interest 1 0  Down, Depressed, Hopeless 0 2  PHQ - 2 Score 1 2  Altered sleeping 0 1  Tired, decreased energy 1 1  Change in appetite 0 0  Feeling bad or failure about yourself  0 0  Trouble concentrating 1 1  Moving slowly or fidgety/restless 0 0  Suicidal thoughts 0 0  PHQ-9 Score 3 5  Difficult doing work/chores - Not difficult at all   She currently lives with: husband Menopausal Symptoms: no  Past Medical History:  Past Medical History:  Diagnosis Date  . Allergy   . Anxiety   . Anxiety and depression   . Depression   . GERD (gastroesophageal reflux disease)   . IBS (irritable bowel syndrome)   . Migraines   . Miscarriage within last 12 months 03/2014  . Multiple thyroid nodules   . Painful menstrual periods 11/13/2014  . Pregnant 02/26/2014  . Raynaud disease   . Vaginal burning 02/23/2015    Surgical History:    Past Surgical History:  Procedure Laterality Date  . COLONOSCOPY  11/03/11   [place]:colonic mucosa appeared normal  . WISDOM TOOTH EXTRACTION      Medications:  Current Outpatient Prescriptions on File Prior to Visit  Medication Sig  . Prenatal Vit-Fe Fumarate-FA (PRENATAL MULTIVITAMIN) TABS tablet Take 1 tablet by mouth daily at 12 noon.   No current facility-administered medications on file prior to visit.     Allergies:  No Known Allergies  Social History:  Social History   Social History  . Marital status: Single    Spouse name: N/A  . Number of children: N/A  . Years of education: N/A   Occupational History  . Social Worker    Social History Main Topics  . Smoking status: Current Every Day Smoker    Packs/day: 0.25    Years: 10.00    Types: Cigarettes  . Smokeless tobacco: Never Used  . Alcohol use Yes     Comment: occ wine  . Drug use: No  . Sexual activity: Yes    Birth control/ protection: None   Other Topics Concern  . Not on file   Social History Narrative  . No narrative on file   History  Smoking Status  . Current Every Day Smoker  . Packs/day: 0.25  . Years: 10.00  . Types: Cigarettes  Smokeless Tobacco  . Never Used   History  Alcohol Use  . Yes  Comment: occ wine    Family History:  Family History  Problem Relation Age of Onset  . Diabetes Father   . Heart disease Father   . Alcohol abuse Father   . Rheum arthritis Mother   . Arthritis Mother     rheumatoid  . Diabetes Paternal Grandfather   . Congestive Heart Failure Paternal Grandfather   . Alzheimer's disease Paternal Grandmother   . Other Maternal Grandmother     obstruction in stomach  . Stroke Maternal Grandfather   . Diabetes Maternal Grandfather   . Diabetes Sister   . Hypertension Sister   . Hypertension Brother   . Colon cancer Neg Hx   . Esophageal cancer Neg Hx   . Rectal cancer Neg Hx   . Stomach cancer Neg Hx     Past medical history, surgical  history, medications, allergies, family history and social history reviewed with patient today and changes made to appropriate areas of the chart.   Review of Systems  Constitutional: Negative.   HENT: Negative for congestion, ear discharge, ear pain, hearing loss, nosebleeds, sore throat and tinnitus.   Eyes: Negative.   Respiratory: Negative.  Negative for stridor.   Cardiovascular: Negative.   Gastrointestinal: Positive for heartburn (couple times recently with food choices). Negative for abdominal pain, blood in stool, constipation, diarrhea, melena, nausea and vomiting.  Genitourinary: Negative.   Musculoskeletal: Negative.   Skin: Negative.        Sweats a lot Mole has been darker- showed up in the last few months   Neurological: Positive for dizziness, tingling (first 3 fingers bilaterally at night) and headaches (For the past weeks). Negative for tremors, sensory change, speech change, focal weakness, seizures and loss of consciousness.  Endo/Heme/Allergies: Negative for environmental allergies and polydipsia. Bruises/bleeds easily.  Psychiatric/Behavioral: Negative.     All other ROS negative except what is listed above and in the HPI.      Objective:    BP 106/67 (BP Location: Left Arm, Patient Position: Sitting, Cuff Size: Normal)   Pulse 68   Temp 99.2 F (37.3 C)   Ht 5\' 3"  (1.6 m)   Wt 126 lb 6.4 oz (57.3 kg)   LMP 10/30/2015 (Exact Date)   SpO2 99%   BMI 22.39 kg/m   Wt Readings from Last 3 Encounters:  11/18/15 126 lb 6.4 oz (57.3 kg)  05/14/15 121 lb (54.9 kg)  02/23/15 119 lb (54 kg)    Physical Exam  Constitutional: She is oriented to person, place, and time. She appears well-developed and well-nourished. No distress.  HENT:  Head: Normocephalic and atraumatic.  Right Ear: Hearing, tympanic membrane, external ear and ear canal normal.  Left Ear: Hearing, tympanic membrane, external ear and ear canal normal.  Nose: Mucosal edema and rhinorrhea present.  Right sinus exhibits no maxillary sinus tenderness and no frontal sinus tenderness. Left sinus exhibits no maxillary sinus tenderness and no frontal sinus tenderness.  Mouth/Throat: Uvula is midline, oropharynx is clear and moist and mucous membranes are normal. No oropharyngeal exudate.  Eyes: Conjunctivae, EOM and lids are normal. Pupils are equal, round, and reactive to light. Right eye exhibits no discharge. Left eye exhibits no discharge. No scleral icterus.  Neck: Normal range of motion. Neck supple. No JVD present. No tracheal deviation present. No thyromegaly present.  Cardiovascular: Normal rate, regular rhythm, normal heart sounds and intact distal pulses.  Exam reveals no gallop and no friction rub.   No murmur heard. Pulmonary/Chest: Effort normal and breath  sounds normal. No stridor. No respiratory distress. She has no wheezes. She has no rales. She exhibits no tenderness.  Abdominal: Soft. Bowel sounds are normal. She exhibits no distension and no mass. There is no tenderness. There is no rebound and no guarding.  Genitourinary:  Genitourinary Comments: Breast and GYN exams deferred, patient sees GYN  Musculoskeletal: Normal range of motion. She exhibits no edema, tenderness or deformity.  Lymphadenopathy:    She has no cervical adenopathy.  Neurological: She is alert and oriented to person, place, and time. She has normal reflexes. She displays normal reflexes. No cranial nerve deficit. She exhibits normal muscle tone. Coordination normal.  + Phalen's bilaterally, negative Tinel's bilaterally  Skin: Skin is warm, dry and intact. No rash noted. She is not diaphoretic. No erythema. No pallor.  Psychiatric: She has a normal mood and affect. Her speech is normal and behavior is normal. Judgment and thought content normal. Cognition and memory are normal.  Nursing note and vitals reviewed.      Assessment & Plan:   Problem List Items Addressed This Visit      Nervous and Auditory     Bilateral carpal tunnel syndrome    Exercises and braces given today. Call with any problems.       Relevant Medications   citalopram (CELEXA) 20 MG tablet     Other   Depression with anxiety    Under good control. Continue current regimen. Continue to monitor. Recheck 6 months.        Other Visit Diagnoses    Routine general medical examination at a health care facility    -  Primary   Vaccines updated. Screening labs checked today. Pap up to date. Continue diet and exercise.    Screening for cholesterol level       Labs checked today. Await results.    Immunization due       Tdap given today       Follow up plan: Return in about 6 months (around 05/18/2016) for Follow up mood.   LABORATORY TESTING:  - Pap smear: up to date  IMMUNIZATIONS:   - Tdap: Tetanus vaccination status reviewed: Tdap vaccination indicated and given today. - Influenza: Refused - Pneumovax: Refused  PATIENT COUNSELING:   Advised to take 1 mg of folate supplement per day if capable of pregnancy.   Sexuality: Discussed sexually transmitted diseases, partner selection, use of condoms, avoidance of unintended pregnancy  and contraceptive alternatives.   Advised to avoid cigarette smoking.  I discussed with the patient that most people either abstain from alcohol or drink within safe limits (<=14/week and <=4 drinks/occasion for males, <=7/weeks and <= 3 drinks/occasion for females) and that the risk for alcohol disorders and other health effects rises proportionally with the number of drinks per week and how often a drinker exceeds daily limits.  Discussed cessation/primary prevention of drug use and availability of treatment for abuse.   Diet: Encouraged to adjust caloric intake to maintain  or achieve ideal body weight, to reduce intake of dietary saturated fat and total fat, to limit sodium intake by avoiding high sodium foods and not adding table salt, and to maintain adequate dietary potassium  and calcium preferably from fresh fruits, vegetables, and low-fat dairy products.    stressed the importance of regular exercise  Injury prevention: Discussed safety belts, safety helmets, smoke detector, smoking near bedding or upholstery.   Dental health: Discussed importance of regular tooth brushing, flossing, and dental visits.  NEXT PREVENTATIVE PHYSICAL DUE IN 1 YEAR. Return in about 6 months (around 05/18/2016) for Follow up mood.

## 2015-11-18 NOTE — Assessment & Plan Note (Signed)
Exercises and braces given today. Call with any problems.

## 2015-11-18 NOTE — Assessment & Plan Note (Signed)
Under good control. Continue current regimen. Continue to monitor. Recheck 6 months.  

## 2015-11-18 NOTE — Patient Instructions (Addendum)
Tdap Vaccine (Tetanus, Diphtheria and Pertussis): What You Need to Know 1. Why get vaccinated? Tetanus, diphtheria and pertussis are very serious diseases. Tdap vaccine can protect us from these diseases. And, Tdap vaccine given to pregnant women can protect newborn babies against pertussis. TETANUS (Lockjaw) is rare in the [place] today. It causes painful muscle tightening and stiffness, usually all over the body.  It can lead to tightening of muscles in the head and neck so you can't open your mouth, swallow, or sometimes even breathe. Tetanus kills about 1 out of 10 people who are infected even after receiving the best medical care. DIPHTHERIA is also rare in the [place] today. It can cause a thick coating to form in the back of the throat.  It can lead to breathing problems, heart failure, paralysis, and death. PERTUSSIS (Whooping Cough) causes severe coughing spells, which can cause difficulty breathing, vomiting and disturbed sleep.  It can also lead to weight loss, incontinence, and rib fractures. Up to 2 in 100 adolescents and 5 in 100 adults with pertussis are hospitalized or have complications, which could include pneumonia or death. These diseases are caused by bacteria. Diphtheria and pertussis are spread from person to person through secretions from coughing or sneezing. Tetanus enters the body through cuts, scratches, or wounds. Before vaccines, as many as 200,000 cases of diphtheria, 200,000 cases of pertussis, and hundreds of cases of tetanus, were reported in the [place] each year. Since vaccination began, reports of cases for tetanus and diphtheria have dropped by about 99% and for pertussis by about 80%. 2. Tdap vaccine Tdap vaccine can protect adolescents and adults from tetanus, diphtheria, and pertussis. One dose of Tdap is routinely given at age 11 or 12. People who did not get Tdap at that age should get it as soon as possible. Tdap is especially important  for healthcare professionals and anyone having close contact with a baby younger than 12 months. Pregnant women should get a dose of Tdap during every pregnancy, to protect the newborn from pertussis. Infants are most at risk for severe, life-threatening complications from pertussis. Another vaccine, called Td, protects against tetanus and diphtheria, but not pertussis. A Td booster should be given every 10 years. Tdap may be given as one of these boosters if you have never gotten Tdap before. Tdap may also be given after a severe cut or burn to prevent tetanus infection. Your doctor or the person giving you the vaccine can give you more information. Tdap may safely be given at the same time as other vaccines. 3. Some people should not get this vaccine  A person who has ever had a life-threatening allergic reaction after a previous dose of any diphtheria, tetanus or pertussis containing vaccine, OR has a severe allergy to any part of this vaccine, should not get Tdap vaccine. Tell the person giving the vaccine about any severe allergies.  Anyone who had coma or long repeated seizures within 7 days after a childhood dose of DTP or DTaP, or a previous dose of Tdap, should not get Tdap, unless a cause other than the vaccine was found. They can still get Td.  Talk to your doctor if you:  have seizures or another nervous system problem,  had severe pain or swelling after any vaccine containing diphtheria, tetanus or pertussis,  ever had a condition called Guillain-Barr Syndrome (GBS),  aren't feeling well on the day the shot is scheduled. 4. Risks With any medicine, including vaccines, there is   a chance of side effects. These are usually mild and go away on their own. Serious reactions are also possible but are rare. Most people who get Tdap vaccine do not have any problems with it. Mild problems following Tdap (Did not interfere with activities)  Pain where the shot was given (about 3 in 4  adolescents or 2 in 3 adults)  Redness or swelling where the shot was given (about 1 person in 5)  Mild fever of at least 100.4F (up to about 1 in 25 adolescents or 1 in 100 adults)  Headache (about 3 or 4 people in 10)  Tiredness (about 1 person in 3 or 4)  Nausea, vomiting, diarrhea, stomach ache (up to 1 in 4 adolescents or 1 in 10 adults)  Chills, sore joints (about 1 person in 10)  Body aches (about 1 person in 3 or 4)  Rash, swollen glands (uncommon) Moderate problems following Tdap (Interfered with activities, but did not require medical attention)  Pain where the shot was given (up to 1 in 5 or 6)  Redness or swelling where the shot was given (up to about 1 in 16 adolescents or 1 in 12 adults)  Fever over 102F (about 1 in 100 adolescents or 1 in 250 adults)  Headache (about 1 in 7 adolescents or 1 in 10 adults)  Nausea, vomiting, diarrhea, stomach ache (up to 1 or 3 people in 100)  Swelling of the entire arm where the shot was given (up to about 1 in 500). Severe problems following Tdap (Unable to perform usual activities; required medical attention)  Swelling, severe pain, bleeding and redness in the arm where the shot was given (rare). Problems that could happen after any vaccine:  People sometimes faint after a medical procedure, including vaccination. Sitting or lying down for about 15 minutes can help prevent fainting, and injuries caused by a fall. Tell your doctor if you feel dizzy, or have vision changes or ringing in the ears.  Some people get severe pain in the shoulder and have difficulty moving the arm where a shot was given. This happens very rarely.  Any medication can cause a severe allergic reaction. Such reactions from a vaccine are very rare, estimated at fewer than 1 in a million doses, and would happen within a few minutes to a few hours after the vaccination. As with any medicine, there is a very remote chance of a vaccine causing a serious  injury or death. The safety of vaccines is always being monitored. For more information, visit: www.cdc.gov/vaccinesafety/ 5. What if there is a serious problem? What should I look for?  Look for anything that concerns you, such as signs of a severe allergic reaction, very high fever, or unusual behavior.  Signs of a severe allergic reaction can include hives, swelling of the face and throat, difficulty breathing, a fast heartbeat, dizziness, and weakness. These would usually start a few minutes to a few hours after the vaccination. What should I do?  If you think it is a severe allergic reaction or other emergency that can't wait, call 9-1-1 or get the person to the nearest hospital. Otherwise, call your doctor.  Afterward, the reaction should be reported to the Vaccine Adverse Event Reporting System (VAERS). Your doctor might file this report, or you can do it yourself through the VAERS web site at www.vaers.hhs.gov, or by calling 1-800-822-7967. VAERS does not give medical advice.  6. The National Vaccine Injury Compensation Program The National Vaccine Injury Compensation Program (  VICP) is a federal program that was created to compensate people who may have been injured by certain vaccines. Persons who believe they may have been injured by a vaccine can learn about the program and about filing a claim by calling 704 373 6095 or visiting the [place] website at GoldCloset.com.ee. There is a time limit to file a claim for compensation. 7. How can I learn more?  Ask your doctor. He or she can give you the vaccine package insert or suggest other sources of information.  Call your local or state health department.  Contact the Centers for Disease Control and Prevention (CDC):  Call (410)145-6878 (1-800-CDC-INFO) or  Visit CDC's website at http://hunter.com/ CDC Tdap Vaccine VIS (04/09/13)   This information is not intended to replace advice given to you by your health care  provider. Make sure you discuss any questions you have with your health care provider.   Document Released: 08/02/2011 Document Revised: 02/21/2014 Document Reviewed: 05/15/2013 Elsevier Interactive Patient Education 2016 [place] Maintenance, Female Adopting a healthy lifestyle and getting preventive care can go a long way to promote health and wellness. Talk with your health care provider about what schedule of regular examinations is right for you. This is a good chance for you to check in with your provider about disease prevention and staying healthy. In between checkups, there are plenty of things you can do on your own. Experts have done a lot of research about which lifestyle changes and preventive measures are most likely to keep you healthy. Ask your health care provider for more information. WEIGHT AND DIET  Eat a healthy diet  Be sure to include plenty of vegetables, fruits, low-fat dairy products, and lean protein.  Do not eat a lot of foods high in solid fats, added sugars, or salt.  Get regular exercise. This is one of the most important things you can do for your health.  Most adults should exercise for at least 150 minutes each week. The exercise should increase your heart rate and make you sweat (moderate-intensity exercise).  Most adults should also do strengthening exercises at least twice a week. This is in addition to the moderate-intensity exercise.  Maintain a healthy weight  Body mass index (BMI) is a measurement that can be used to identify possible weight problems. It estimates body fat based on height and weight. Your health care provider can help determine your BMI and help you achieve or maintain a healthy weight.  For females 90 years of age and older:   A BMI below 18.5 is considered underweight.  A BMI of 18.5 to 24.9 is normal.  A BMI of 25 to 29.9 is considered overweight.  A BMI of 30 and above is considered obese.  Watch levels of  cholesterol and blood lipids  You should start having your blood tested for lipids and cholesterol at 29 years of age, then have this test every 5 years.  You may need to have your cholesterol levels checked more often if:  Your lipid or cholesterol levels are high.  You are older than 29 years of age.  You are at high risk for heart disease.  CANCER SCREENING   Lung Cancer  Lung cancer screening is recommended for adults 68-65 years old who are at high risk for lung cancer because of a history of smoking.  A yearly low-dose CT scan of the lungs is recommended for people who:  Currently smoke.  Have quit within the past 15 years.  Have  at least a 30-pack-year history of smoking. A pack year is smoking an average of one pack of cigarettes a day for 1 year.  Yearly screening should continue until it has been 15 years since you quit.  Yearly screening should stop if you develop a health problem that would prevent you from having lung cancer treatment.  Breast Cancer  Practice breast self-awareness. This means understanding how your breasts normally appear and feel.  It also means doing regular breast self-exams. Let your health care provider know about any changes, no matter how small.  If you are in your 20s or 30s, you should have a clinical breast exam (CBE) by a health care provider every 1-3 years as part of a regular health exam.  If you are 51 or older, have a CBE every year. Also consider having a breast X-ray (mammogram) every year.  If you have a family history of breast cancer, talk to your health care provider about genetic screening.  If you are at high risk for breast cancer, talk to your health care provider about having an MRI and a mammogram every year.  Breast cancer gene (BRCA) assessment is recommended for women who have family members with BRCA-related cancers. BRCA-related cancers include:  Breast.  Ovarian.  Tubal.  Peritoneal  cancers.  Results of the assessment will determine the need for genetic counseling and BRCA1 and BRCA2 testing. Cervical Cancer Your health care provider may recommend that you be screened regularly for cancer of the pelvic organs (ovaries, uterus, and vagina). This screening involves a pelvic examination, including checking for microscopic changes to the surface of your cervix (Pap test). You may be encouraged to have this screening done every 3 years, beginning at age 68.  For women ages 37-65, health care providers may recommend pelvic exams and Pap testing every 3 years, or they may recommend the Pap and pelvic exam, combined with testing for human papilloma virus (HPV), every 5 years. Some types of HPV increase your risk of cervical cancer. Testing for HPV may also be done on women of any age with unclear Pap test results.  Other health care providers may not recommend any screening for nonpregnant women who are considered low risk for pelvic cancer and who do not have symptoms. Ask your health care provider if a screening pelvic exam is right for you.  If you have had past treatment for cervical cancer or a condition that could lead to cancer, you need Pap tests and screening for cancer for at least 20 years after your treatment. If Pap tests have been discontinued, your risk factors (such as having a new sexual partner) need to be reassessed to determine if screening should resume. Some women have medical problems that increase the chance of getting cervical cancer. In these cases, your health care provider may recommend more frequent screening and Pap tests. Colorectal Cancer  This type of cancer can be detected and often prevented.  Routine colorectal cancer screening usually begins at 29 years of age and continues through 29 years of age.  Your health care provider may recommend screening at an earlier age if you have risk factors for colon cancer.  Your health care provider may also  recommend using home test kits to check for hidden blood in the stool.  A small camera at the end of a tube can be used to examine your colon directly (sigmoidoscopy or colonoscopy). This is done to check for the earliest forms of colorectal cancer.  Routine  screening usually begins at age 50.  Direct examination of the colon should be repeated every 5-10 years through 29 years of age. However, you may need to be screened more often if early forms of precancerous polyps or small growths are found. Skin Cancer  Check your skin from head to toe regularly.  Tell your health care provider about any new moles or changes in moles, especially if there is a change in a mole's shape or color.  Also tell your health care provider if you have a mole that is larger than the size of a pencil eraser.  Always use sunscreen. Apply sunscreen liberally and repeatedly throughout the day.  Protect yourself by wearing long sleeves, pants, a wide-brimmed hat, and sunglasses whenever you are outside. HEART DISEASE, DIABETES, AND HIGH BLOOD PRESSURE   High blood pressure causes heart disease and increases the risk of stroke. High blood pressure is more likely to develop in:  People who have blood pressure in the high end of the normal range (130-139/85-89 mm Hg).  People who are overweight or obese.  People who are African American.  If you are 12-61 years of age, have your blood pressure checked every 3-5 years. If you are 69 years of age or older, have your blood pressure checked every year. You should have your blood pressure measured twice--once when you are at a hospital or clinic, and once when you are not at a hospital or clinic. Record the average of the two measurements. To check your blood pressure when you are not at a hospital or clinic, you can use:  An automated blood pressure machine at a pharmacy.  A home blood pressure monitor.  If you are between 66 years and 67 years old, ask your health  care provider if you should take aspirin to prevent strokes.  Have regular diabetes screenings. This involves taking a blood sample to check your fasting blood sugar level.  If you are at a normal weight and have a low risk for diabetes, have this test once every three years after 29 years of age.  If you are overweight and have a high risk for diabetes, consider being tested at a younger age or more often. PREVENTING INFECTION  Hepatitis B  If you have a higher risk for hepatitis B, you should be screened for this virus. You are considered at high risk for hepatitis B if:  You were born in a country where hepatitis B is common. Ask your health care provider which countries are considered high risk.  Your parents were born in a high-risk country, and you have not been immunized against hepatitis B (hepatitis B vaccine).  You have HIV or AIDS.  You use needles to inject street drugs.  You live with someone who has hepatitis B.  You have had sex with someone who has hepatitis B.  You get hemodialysis treatment.  You take certain medicines for conditions, including cancer, organ transplantation, and autoimmune conditions. Hepatitis C  Blood testing is recommended for:  Everyone born from [birth year] through 1964.  Anyone with known risk factors for hepatitis C. Sexually transmitted infections (STIs)  You should be screened for sexually transmitted infections (STIs) including gonorrhea and chlamydia if:  You are sexually active and are younger than 29 years of age.  You are older than 29 years of age and your health care provider tells you that you are at risk for this type of infection.  Your sexual activity has changed since you  were last screened and you are at an increased risk for chlamydia or gonorrhea. Ask your health care provider if you are at risk.  If you do not have HIV, but are at risk, it may be recommended that you take a prescription medicine daily to prevent HIV  infection. This is called pre-exposure prophylaxis (PrEP). You are considered at risk if:  You are sexually active and do not regularly use condoms or know the HIV status of your partner(s).  You take drugs by injection.  You are sexually active with a partner who has HIV. Talk with your health care provider about whether you are at high risk of being infected with HIV. If you choose to begin PrEP, you should first be tested for HIV. You should then be tested every 3 months for as long as you are taking PrEP.  PREGNANCY   If you are premenopausal and you may become pregnant, ask your health care provider about preconception counseling.  If you may become pregnant, take 400 to 800 micrograms (mcg) of folic acid every day.  If you want to prevent pregnancy, talk to your health care provider about birth control (contraception). OSTEOPOROSIS AND MENOPAUSE   Osteoporosis is a disease in which the bones lose minerals and strength with aging. This can result in serious bone fractures. Your risk for osteoporosis can be identified using a bone density scan.  If you are 72 years of age or older, or if you are at risk for osteoporosis and fractures, ask your health care provider if you should be screened.  Ask your health care provider whether you should take a calcium or vitamin D supplement to lower your risk for osteoporosis.  Menopause may have certain physical symptoms and risks.  Hormone replacement therapy may reduce some of these symptoms and risks. Talk to your health care provider about whether hormone replacement therapy is right for you.  HOME CARE INSTRUCTIONS   Schedule regular health, dental, and eye exams.  Stay current with your immunizations.   Do not use any tobacco products including cigarettes, chewing tobacco, or electronic cigarettes.  If you are pregnant, do not drink alcohol.  If you are breastfeeding, limit how much and how often you drink alcohol.  Limit  alcohol intake to no more than 1 drink per day for nonpregnant women. One drink equals 12 ounces of beer, 5 ounces of wine, or 1 ounces of hard liquor.  Do not use street drugs.  Do not share needles.  Ask your health care provider for help if you need support or information about quitting drugs.  Tell your health care provider if you often feel depressed.  Tell your health care provider if you have ever been abused or do not feel safe at home.   This information is not intended to replace advice given to you by your health care provider. Make sure you discuss any questions you have with your health care provider.   Document Released: 08/16/2010 Document Revised: 02/21/2014 Document Reviewed: 01/02/2013 Elsevier Interactive Patient Education 2016 [place] Syndrome Carpal tunnel syndrome is a condition that causes pain in your hand and arm. The carpal tunnel is a narrow area located on the palm side of your wrist. Repeated wrist motion or certain diseases may cause swelling within the tunnel. This swelling pinches the main nerve in the wrist (median nerve). CAUSES  This condition may be caused by:   Repeated wrist motions.  Wrist injuries.  Arthritis.  A  cyst or tumor in the carpal tunnel.  Fluid buildup during pregnancy. Sometimes the cause of this condition is not known.  RISK FACTORS This condition is more likely to develop in:   People who have jobs that cause them to repeatedly move their wrists in the same motion, such as butchers and cashiers.  Women.  People with certain conditions, such as:  Diabetes.  Obesity.  An underactive thyroid (hypothyroidism).  Kidney failure. SYMPTOMS  Symptoms of this condition include:   A tingling feeling in your fingers, especially in your thumb, index, and middle fingers.  Tingling or numbness in your hand.  An aching feeling in your entire arm, especially when your wrist and elbow are bent for long  periods of time.  Wrist pain that goes up your arm to your shoulder.  Pain that goes down into your palm or fingers.  A weak feeling in your hands. You may have trouble grabbing and holding items. Your symptoms may feel worse during the night.  DIAGNOSIS  This condition is diagnosed with a medical history and physical exam. You may also have tests, including:   An electromyogram (EMG). This test measures electrical signals sent by your nerves into the muscles.  X-rays. TREATMENT  Treatment for this condition includes:  Lifestyle changes. It is important to stop doing or modify the activity that caused your condition.  Physical or occupational therapy.  Medicines for pain and inflammation. This may include medicine that is injected into your wrist.  A wrist splint.  Surgery. HOME CARE INSTRUCTIONS  If You Have a Splint:  Wear it as told by your health care provider. Remove it only as told by your health care provider.  Loosen the splint if your fingers become numb and tingle, or if they turn cold and blue.  Keep the splint clean and dry. General Instructions  Take over-the-counter and prescription medicines only as told by your health care provider.  Rest your wrist from any activity that may be causing your pain. If your condition is work related, talk to your employer about changes that can be made, such as getting a wrist pad to use while typing.  If directed, apply ice to the painful area:  Put ice in a plastic bag.  Place a towel between your skin and the bag.  Leave the ice on for 20 minutes, 2-3 times per day.  Keep all follow-up visits as told by your health care provider. This is important.  Do any exercises as told by your health care provider, physical therapist, or occupational therapist. Coke IF:   You have new symptoms.  Your pain is not controlled with medicines.  Your symptoms get worse.   This information is not intended to  replace advice given to you by your health care provider. Make sure you discuss any questions you have with your health care provider.   Document Released: 01/29/2000 Document Revised: 10/22/2014 Document Reviewed: 06/18/2014 Elsevier Interactive Patient Education [employer].

## 2015-11-19 ENCOUNTER — Other Ambulatory Visit: Payer: Self-pay | Admitting: Family Medicine

## 2015-11-19 ENCOUNTER — Encounter: Payer: Self-pay | Admitting: Family Medicine

## 2015-11-19 DIAGNOSIS — N979 Female infertility, unspecified: Secondary | ICD-10-CM | POA: Diagnosis not present

## 2015-11-19 DIAGNOSIS — D72819 Decreased white blood cell count, unspecified: Secondary | ICD-10-CM

## 2015-11-19 LAB — CBC WITH DIFFERENTIAL/PLATELET
Basophils Absolute: 0 10*3/uL (ref 0.0–0.2)
Basos: 0 %
EOS (ABSOLUTE): 0 10*3/uL (ref 0.0–0.4)
EOS: 1 %
HEMATOCRIT: 38 % (ref 34.0–46.6)
HEMOGLOBIN: 12.5 g/dL (ref 11.1–15.9)
Immature Grans (Abs): 0 10*3/uL (ref 0.0–0.1)
Immature Granulocytes: 0 %
LYMPHS ABS: 1 10*3/uL (ref 0.7–3.1)
Lymphs: 39 %
MCH: 26.9 pg (ref 26.6–33.0)
MCHC: 32.9 g/dL (ref 31.5–35.7)
MCV: 82 fL (ref 79–97)
MONOS ABS: 0.3 10*3/uL (ref 0.1–0.9)
Monocytes: 10 %
NEUTROS ABS: 1.3 10*3/uL — AB (ref 1.4–7.0)
Neutrophils: 50 %
Platelets: 151 10*3/uL (ref 150–379)
RBC: 4.64 x10E6/uL (ref 3.77–5.28)
RDW: 13.8 % (ref 12.3–15.4)
WBC: 2.6 10*3/uL — AB (ref 3.4–10.8)

## 2015-11-19 LAB — LIPID PANEL W/O CHOL/HDL RATIO
CHOLESTEROL TOTAL: 146 mg/dL (ref 100–199)
HDL: 39 mg/dL — ABNORMAL LOW (ref >39)
LDL CALC: 87 mg/dL (ref 0–99)
TRIGLYCERIDES: 102 mg/dL (ref 0–149)
VLDL Cholesterol Cal: 20 mg/dL (ref 5–40)

## 2015-11-19 LAB — COMPREHENSIVE METABOLIC PANEL
A/G RATIO: 2.1 (ref 1.2–2.2)
ALBUMIN: 4.7 g/dL (ref 3.5–5.5)
ALK PHOS: 43 IU/L (ref 39–117)
ALT: 9 IU/L (ref 0–32)
AST: 16 IU/L (ref 0–40)
BILIRUBIN TOTAL: 0.2 mg/dL (ref 0.0–1.2)
BUN / CREAT RATIO: 17 (ref 9–23)
BUN: 11 mg/dL (ref 6–20)
CO2: 26 mmol/L (ref 18–29)
Calcium: 9.4 mg/dL (ref 8.7–10.2)
Chloride: 102 mmol/L (ref 96–106)
Creatinine, Ser: 0.66 mg/dL (ref 0.57–1.00)
GFR calc Af Amer: 139 mL/min/{1.73_m2} (ref >59)
GFR calc non Af Amer: 121 mL/min/{1.73_m2} (ref >59)
GLOBULIN, TOTAL: 2.2 g/dL (ref 1.5–4.5)
Glucose: 68 mg/dL (ref 65–99)
POTASSIUM: 4.2 mmol/L (ref 3.5–5.2)
SODIUM: 140 mmol/L (ref 134–144)
Total Protein: 6.9 g/dL (ref 6.0–8.5)

## 2015-11-19 LAB — TSH: TSH: 1.87 u[IU]/mL (ref 0.450–4.500)

## 2015-11-20 ENCOUNTER — Encounter: Payer: Self-pay | Admitting: Family Medicine

## 2015-11-20 NOTE — Telephone Encounter (Signed)
Routing to provider  

## 2015-11-26 ENCOUNTER — Encounter: Payer: Self-pay | Admitting: Family Medicine

## 2015-11-26 NOTE — Telephone Encounter (Signed)
Routing to provider  

## 2015-12-01 ENCOUNTER — Encounter: Payer: Self-pay | Admitting: Family Medicine

## 2015-12-01 ENCOUNTER — Ambulatory Visit (INDEPENDENT_AMBULATORY_CARE_PROVIDER_SITE_OTHER): Payer: BLUE CROSS/BLUE SHIELD | Admitting: Family Medicine

## 2015-12-01 VITALS — BP 128/84 | HR 82 | Temp 98.5°F | Wt 128.0 lb

## 2015-12-01 DIAGNOSIS — R7989 Other specified abnormal findings of blood chemistry: Secondary | ICD-10-CM

## 2015-12-01 DIAGNOSIS — J0100 Acute maxillary sinusitis, unspecified: Secondary | ICD-10-CM | POA: Diagnosis not present

## 2015-12-01 MED ORDER — TRIAMCINOLONE ACETONIDE 40 MG/ML IJ SUSP
40.0000 mg | Freq: Once | INTRAMUSCULAR | Status: AC
  Administered 2015-12-01: 40 mg via INTRAMUSCULAR

## 2015-12-01 MED ORDER — AMOXICILLIN-POT CLAVULANATE 875-125 MG PO TABS: 1.0000 | ORAL_TABLET | Freq: Two times a day (BID) | ORAL | 0 refills | Status: DC

## 2015-12-01 MED ORDER — BENZONATATE 100 MG PO CAPS: 200.0000 mg | ORAL_CAPSULE | Freq: Two times a day (BID) | ORAL | 0 refills | Status: DC | PRN

## 2015-12-01 NOTE — Patient Instructions (Signed)
Follow up as needed

## 2015-12-01 NOTE — Progress Notes (Signed)
BP 128/84 (BP Location: Right Arm, Patient Position: Sitting, Cuff Size: Normal)   Pulse 82   Temp 98.5 F (36.9 C)   Wt 128 lb (58.1 kg)   LMP 11/24/2015   SpO2 100%   BMI 22.67 kg/m    Subjective:    Patient ID: Angela [name], female    DOB: 1985/03/13, 30 y.o.   MRN: [id number]  HPI: Angela A [name] is a 30 y.o. female  Chief Complaint  Patient presents with  . Sore Throat    Pt states that she came in two weeks with sinus inflammtion and headaches. A week ago from today pateint states her throat got sore and has stayed sore and over the weekend she has started to cough. Pt states she coughed up dark green mucus this morning. Pt states that she has been checking her temperature and has no fever.    Patient presents for follow up of sinus symptoms that she first started noticing 2-3 weeks ago. Symptoms have progressively worsened, and she is now having productive cough, sinus pain and pressure, HAs, malaise, and sore throat. Has been taking mucinex DM, dayquil, and other OTC sinus medications for weeks. Also using neti pot. Denies fever, chills, or aches.   Also wanted to recheck CBC as her numbers were slightly off last appt and she is very concerned by it.   Relevant past medical, surgical, family and social history reviewed and updated as indicated. Interim medical history since our last visit reviewed. Allergies and medications reviewed and updated.  Review of Systems  Constitutional: Positive for fatigue.  HENT: Positive for congestion, postnasal drip, rhinorrhea, sinus pressure and sore throat.   Respiratory: Positive for cough.   Cardiovascular: Negative.   Gastrointestinal: Negative.   Genitourinary: Negative.   Musculoskeletal: Negative.   Skin: Negative.   Neurological: Negative.   Psychiatric/Behavioral: Negative.     Per HPI unless specifically indicated above     Objective:    BP 128/84 (BP Location: Right Arm, Patient Position: Sitting, Cuff Size:  Normal)   Pulse 82   Temp 98.5 F (36.9 C)   Wt 128 lb (58.1 kg)   LMP 11/24/2015   SpO2 100%   BMI 22.67 kg/m   Wt Readings from Last 3 Encounters:  12/01/15 128 lb (58.1 kg)  11/18/15 126 lb 6.4 oz (57.3 kg)  05/14/15 121 lb (54.9 kg)    Physical Exam  Constitutional: She is oriented to person, place, and time. She appears well-developed and well-nourished. No distress.  HENT:  Head: Atraumatic.  Right Ear: External ear normal.  Left Ear: External ear normal.  Nose: Nose normal.  Left maxillary sinus TTP Oropharynx erythematous, with no exudates or tonsillar edema  Eyes: Conjunctivae are normal. Pupils are equal, round, and reactive to light. No scleral icterus.  Neck: Normal range of motion. Neck supple.  Cardiovascular: Normal rate.   Pulmonary/Chest: Effort normal and breath sounds normal. No respiratory distress.  Musculoskeletal: Normal range of motion.  Neurological: She is alert and oriented to person, place, and time.  Skin: Skin is warm and dry.  Psychiatric: She has a normal mood and affect. Her behavior is normal.    Results for orders placed or performed in visit on 11/18/15  CBC with Differential/Platelet  Result Value Ref Range   WBC 2.6 (L) 3.4 - 10.8 x10E3/uL   RBC 4.64 3.77 - 5.28 x10E6/uL   Hemoglobin 12.5 11.1 - 15.9 g/dL   Hematocrit 40.938.0 81.134.0 - 46.6 %  MCV 82 79 - 97 fL   MCH 26.9 26.6 - 33.0 pg   MCHC 32.9 31.5 - 35.7 g/dL   RDW 16.1 09.6 - 04.5 %   Platelets 151 150 - 379 x10E3/uL   Neutrophils 50 Not Estab. %   Lymphs 39 Not Estab. %   Monocytes 10 Not Estab. %   Eos 1 Not Estab. %   Basos 0 Not Estab. %   Neutrophils Absolute 1.3 (L) 1.4 - 7.0 x10E3/uL   Lymphocytes Absolute 1.0 0.7 - 3.1 x10E3/uL   Monocytes Absolute 0.3 0.1 - 0.9 x10E3/uL   EOS (ABSOLUTE) 0.0 0.0 - 0.4 x10E3/uL   Basophils Absolute 0.0 0.0 - 0.2 x10E3/uL   Immature Granulocytes 0 Not Estab. %   Immature Grans (Abs) 0.0 0.0 - 0.1 x10E3/uL  Comprehensive metabolic  panel  Result Value Ref Range   Glucose 68 65 - 99 mg/dL   BUN 11 6 - 20 mg/dL   Creatinine, Ser 4.09 0.57 - 1.00 mg/dL   GFR calc non Af Amer 121 >59 mL/min/1.73   GFR calc Af Amer 139 >59 mL/min/1.73   BUN/Creatinine Ratio 17 9 - 23   Sodium 140 134 - 144 mmol/L   Potassium 4.2 3.5 - 5.2 mmol/L   Chloride 102 96 - 106 mmol/L   CO2 26 18 - 29 mmol/L   Calcium 9.4 8.7 - 10.2 mg/dL   Total Protein 6.9 6.0 - 8.5 g/dL   Albumin 4.7 3.5 - 5.5 g/dL   Globulin, Total 2.2 1.5 - 4.5 g/dL   Albumin/Globulin Ratio 2.1 1.2 - 2.2   Bilirubin Total 0.2 0.0 - 1.2 mg/dL   Alkaline Phosphatase 43 39 - 117 IU/L   AST 16 0 - 40 IU/L   ALT 9 0 - 32 IU/L  Lipid Panel w/o Chol/HDL Ratio  Result Value Ref Range   Cholesterol, Total 146 100 - 199 mg/dL   Triglycerides 811 0 - 149 mg/dL   HDL 39 (L) >91 mg/dL   VLDL Cholesterol Cal 20 5 - 40 mg/dL   LDL Calculated 87 0 - 99 mg/dL  TSH  Result Value Ref Range   TSH 1.870 0.450 - 4.500 uIU/mL  UA/M w/rflx Culture, Routine  Result Value Ref Range   Specific Gravity, UA 1.020 1.005 - 1.030   pH, UA 6.5 5.0 - 7.5   Color, UA Yellow Yellow   Appearance Ur Clear Clear   Leukocytes, UA Negative Negative   Protein, UA Negative Negative/Trace   Glucose, UA Negative Negative   Ketones, UA Negative Negative   RBC, UA Negative Negative   Bilirubin, UA Negative Negative   Urobilinogen, Ur 0.2 0.2 - 1.0 mg/dL   Nitrite, UA Negative Negative      Assessment & Plan:   Problem List Items Addressed This Visit    None    Visit Diagnoses    Acute non-recurrent maxillary sinusitis    -  Primary   Augmentin and tessalon perles sent, IM kenalog today in office. Continue mucinex and delsym OTC. Humidifier, neti pot, rest. Work note given   Relevant Medications   amoxicillin-clavulanate (AUGMENTIN) 875-125 MG tablet   benzonatate (TESSALON) 100 MG capsule   triamcinolone acetonide (KENALOG-40) injection 40 mg (Completed)   Abnormal CBC       Await recheck  results   Relevant Medications   triamcinolone acetonide (KENALOG-40) injection 40 mg (Completed)   Other Relevant Orders   CBC with Differential/Platelet       Follow up  plan: Return if symptoms worsen or fail to improve.

## 2015-12-02 ENCOUNTER — Encounter: Payer: Self-pay | Admitting: Family Medicine

## 2015-12-02 LAB — CBC WITH DIFFERENTIAL/PLATELET
BASOS ABS: 0 10*3/uL (ref 0.0–0.2)
Basos: 0 %
EOS (ABSOLUTE): 0 10*3/uL (ref 0.0–0.4)
Eos: 0 %
Hematocrit: 37.1 % (ref 34.0–46.6)
Hemoglobin: 12.2 g/dL (ref 11.1–15.9)
IMMATURE GRANS (ABS): 0 10*3/uL (ref 0.0–0.1)
Immature Granulocytes: 0 %
LYMPHS ABS: 1.3 10*3/uL (ref 0.7–3.1)
Lymphs: 17 %
MCH: 27.1 pg (ref 26.6–33.0)
MCHC: 32.9 g/dL (ref 31.5–35.7)
MCV: 82 fL (ref 79–97)
MONOS ABS: 0.4 10*3/uL (ref 0.1–0.9)
Monocytes: 5 %
NEUTROS ABS: 5.7 10*3/uL (ref 1.4–7.0)
Neutrophils: 78 %
PLATELETS: 194 10*3/uL (ref 150–379)
RBC: 4.51 x10E6/uL (ref 3.77–5.28)
RDW: 13.8 % (ref 12.3–15.4)
WBC: 7.4 10*3/uL (ref 3.4–10.8)

## 2015-12-11 ENCOUNTER — Ambulatory Visit (INDEPENDENT_AMBULATORY_CARE_PROVIDER_SITE_OTHER): Payer: BLUE CROSS/BLUE SHIELD | Admitting: Family Medicine

## 2015-12-11 ENCOUNTER — Encounter: Payer: Self-pay | Admitting: Family Medicine

## 2015-12-11 VITALS — BP 121/76 | HR 84 | Temp 98.4°F | Wt 124.6 lb

## 2015-12-11 DIAGNOSIS — J0101 Acute recurrent maxillary sinusitis: Secondary | ICD-10-CM | POA: Diagnosis not present

## 2015-12-11 MED ORDER — LEVOFLOXACIN 750 MG PO TABS: 750.0000 mg | ORAL_TABLET | Freq: Every day | ORAL | 0 refills | Status: DC

## 2015-12-11 NOTE — Progress Notes (Signed)
BP 121/76 (BP Location: Left Arm, Patient Position: Sitting, Cuff Size: Normal)   Pulse 84   Temp 98.4 F (36.9 C)   Wt 124 lb 9.6 oz (56.5 kg)   LMP 11/24/2015   SpO2 98%   BMI 22.07 kg/m    Subjective:    Patient ID: Angela [name], female    DOB: 08-21-1985, 30 y.o.   MRN: [id number]  HPI: Angela [name] is a 30 y.o. female  Chief Complaint  Patient presents with  . URI    Patient states that she has not gotten any better over the last month   UPPER RESPIRATORY TRACT INFECTION Duration: almost a month- has taken a course of augmentin without much benefit. Feeling pretty terrible.  Worst symptom: congestion Fever: no Cough: yes Shortness of breath: no Wheezing: no Chest pain: no Chest tightness: no Chest congestion: no Nasal congestion: yes Runny nose: yes Post nasal drip: yes Sneezing: yes Sore throat: no Swollen glands: no Sinus pressure: yes Headache: yes Face pain: yes Toothache: yes Ear pain: no  Ear pressure: no Eyes red/itching:no Eye drainage/crusting: no  Vomiting: no Rash: no Fatigue: yes Sick contacts: yes Strep contacts: no  Context: stable Recurrent sinusitis: no Relief with OTC cold/cough medications: no  Treatments attempted: cold/sinus, mucinex, anti-histamine, pseudoephedrine, cough syrup and antibiotics    Relevant past medical, surgical, family and social history reviewed and updated as indicated. Interim medical history since our last visit reviewed. Allergies and medications reviewed and updated.  Review of Systems  Constitutional: Positive for fatigue. Negative for activity change, appetite change, chills, diaphoresis, fever and unexpected weight change.  HENT: Positive for congestion, postnasal drip, rhinorrhea, sinus pressure and sneezing. Negative for dental problem, drooling, ear discharge, ear pain, facial swelling, hearing loss, mouth sores, nosebleeds, sore throat, tinnitus, trouble swallowing and voice change.     Respiratory: Positive for cough, shortness of breath and wheezing. Negative for apnea, choking, chest tightness and stridor.   Cardiovascular: Negative.   Psychiatric/Behavioral: Negative.     Per HPI unless specifically indicated above     Objective:    BP 121/76 (BP Location: Left Arm, Patient Position: Sitting, Cuff Size: Normal)   Pulse 84   Temp 98.4 F (36.9 C)   Wt 124 lb 9.6 oz (56.5 kg)   LMP 11/24/2015   SpO2 98%   BMI 22.07 kg/m   Wt Readings from Last 3 Encounters:  12/11/15 124 lb 9.6 oz (56.5 kg)  12/01/15 128 lb (58.1 kg)  11/18/15 126 lb 6.4 oz (57.3 kg)    Physical Exam  Constitutional: She is oriented to person, place, and time. She appears well-developed and well-nourished. No distress.  HENT:  Head: Normocephalic and atraumatic.  Right Ear: Hearing, tympanic membrane, external ear and ear canal normal.  Left Ear: Hearing, tympanic membrane, external ear and ear canal normal.  Nose: Mucosal edema and rhinorrhea present. Right sinus exhibits no maxillary sinus tenderness and no frontal sinus tenderness. Left sinus exhibits maxillary sinus tenderness. Left sinus exhibits no frontal sinus tenderness.  Mouth/Throat: Uvula is midline, oropharynx is clear and moist and mucous membranes are normal. No oropharyngeal exudate.  Eyes: Conjunctivae, EOM and lids are normal. Pupils are equal, round, and reactive to light. Right eye exhibits no discharge. Left eye exhibits no discharge. No scleral icterus.  Neck: Normal range of motion. Neck supple. No JVD present. No tracheal deviation present. No thyromegaly present.  Cardiovascular: Normal rate, regular rhythm, normal heart sounds and intact distal pulses.  Exam reveals no gallop and no friction rub.   No murmur heard. Pulmonary/Chest: Effort normal. No stridor. No respiratory distress. She has wheezes. She has no rales. She exhibits no tenderness.  Musculoskeletal: Normal range of motion.  Lymphadenopathy:    She has  no cervical adenopathy.  Neurological: She is alert and oriented to person, place, and time.  Skin: Skin is warm, dry and intact. No rash noted. She is not diaphoretic. No erythema. No pallor.  Psychiatric: She has a normal mood and affect. Her speech is normal and behavior is normal. Judgment and thought content normal. Cognition and memory are normal.  Nursing note and vitals reviewed.   Results for orders placed or performed in visit on 12/01/15  CBC with Differential/Platelet  Result Value Ref Range   WBC 7.4 3.4 - 10.8 x10E3/uL   RBC 4.51 3.77 - 5.28 x10E6/uL   Hemoglobin 12.2 11.1 - 15.9 g/dL   Hematocrit 69.637.1 29.534.0 - 46.6 %   MCV 82 79 - 97 fL   MCH 27.1 26.6 - 33.0 pg   MCHC 32.9 31.5 - 35.7 g/dL   RDW 28.413.8 13.212.3 - 44.015.4 %   Platelets 194 150 - 379 x10E3/uL   Neutrophils 78 Not Estab. %   Lymphs 17 Not Estab. %   Monocytes 5 Not Estab. %   Eos 0 Not Estab. %   Basos 0 Not Estab. %   Neutrophils Absolute 5.7 1.4 - 7.0 x10E3/uL   Lymphocytes Absolute 1.3 0.7 - 3.1 x10E3/uL   Monocytes Absolute 0.4 0.1 - 0.9 x10E3/uL   EOS (ABSOLUTE) 0.0 0.0 - 0.4 x10E3/uL   Basophils Absolute 0.0 0.0 - 0.2 x10E3/uL   Immature Granulocytes 0 Not Estab. %   Immature Grans (Abs) 0.0 0.0 - 0.1 x10E3/uL      Assessment & Plan:   Problem List Items Addressed This Visit    None    Visit Diagnoses    Acute recurrent maxillary sinusitis    -  Primary   Will treat with levaquin. Make sure to take probiotic with it. Call with any concerns. Recheck 2 weeks to confirm resolution.    Relevant Medications   Pseudoephedrine-Guaifenesin (MUCUS D PO)   levofloxacin (LEVAQUIN) 750 MG tablet       Follow up plan: Return in about 2 weeks (around 12/25/2015) for Follow up sinusitis/bronchitis.

## 2015-12-28 ENCOUNTER — Ambulatory Visit: Payer: BLUE CROSS/BLUE SHIELD | Admitting: Family Medicine

## 2016-01-04 ENCOUNTER — Encounter: Payer: Self-pay | Admitting: Family Medicine

## 2016-01-04 ENCOUNTER — Ambulatory Visit: Payer: BLUE CROSS/BLUE SHIELD | Admitting: Family Medicine

## 2016-01-04 ENCOUNTER — Ambulatory Visit (INDEPENDENT_AMBULATORY_CARE_PROVIDER_SITE_OTHER): Payer: BLUE CROSS/BLUE SHIELD | Admitting: Family Medicine

## 2016-01-04 VITALS — BP 111/71 | HR 81 | Temp 98.6°F | Wt 126.0 lb

## 2016-01-04 DIAGNOSIS — N926 Irregular menstruation, unspecified: Secondary | ICD-10-CM | POA: Diagnosis not present

## 2016-01-04 DIAGNOSIS — J0100 Acute maxillary sinusitis, unspecified: Secondary | ICD-10-CM

## 2016-01-04 NOTE — Progress Notes (Signed)
BP 111/71 (BP Location: Right Arm, Patient Position: Sitting, Cuff Size: Normal)   Pulse 81   Temp 98.6 F (37 C)   Wt 126 lb (57.2 kg)   LMP 12/30/2015   SpO2 99%   BMI 22.32 kg/m    Subjective:    Patient ID: Angela [name], female    DOB: [birth year], 30 y.o.   MRN: [id number]  HPI: Angela A [name] is a 30 y.o. female  Chief Complaint  Patient presents with  . Bronchitis    Pt states that she is hear to make sure that the bronchitis and sinus infection are gone. She thinks she still has some flem and gets occasional sinus head aches but she states that she thinks she is fine and that it is the weather.   . Sinusitis   Patient presents for 2 week f/u for persistent sinusitis. States she is finally feeling better. Still some occasional sinus congestion and HAs but the cough has subsided. Taking mucinex prn for congestion with good relief. Denies fever, chills, CP, SOB.  Also having some menstrual irregularity. States she is currently trying to conceive, and had a normal cycle Nov. 4th followed by 5 days of heavy bleeding, clots, and cramping almost 2 weeks later. Bleeding stopped yesterday. Does have a hx of miscarriage and is very concerned about the cause of the bleeding. Called GYN, who told her it was likely ovulatory bleeding.   Past Medical History:  Diagnosis Date  . Allergy   . Anxiety   . Anxiety and depression   . Depression   . GERD (gastroesophageal reflux disease)   . IBS (irritable bowel syndrome)   . Migraines   . Miscarriage within last 12 months 03/2014  . Multiple thyroid nodules   . Painful menstrual periods 11/13/2014  . Pregnant 02/26/2014  . Raynaud disease   . Vaginal burning 02/23/2015   Social History   Social History  . Marital status: Single    Spouse name: Angela [name]  . Number of children: Angela [name]  . Years of education: Angela [name]   Occupational History  . Social Worker    Social History Main Topics  . Smoking status: Current Every Day Smoker   Packs/day: 0.25    Years: 10.00    Types: Cigarettes  . Smokeless tobacco: Never Used  . Alcohol use Yes     Comment: occ wine  . Drug use: No  . Sexual activity: Yes    Birth control/ protection: None   Other Topics Concern  . Not on file   Social History Narrative  . No narrative on file    Relevant past medical, surgical, family and social history reviewed and updated as indicated. Interim medical history since our last visit reviewed. Allergies and medications reviewed and updated.  Review of Systems  Constitutional: Negative.   HENT: Positive for sinus pressure.   Eyes: Negative.   Respiratory: Negative.   Gastrointestinal: Negative.   Genitourinary: Positive for menstrual problem.  Musculoskeletal: Negative.   Skin: Negative.   Neurological: Positive for headaches.  Psychiatric/Behavioral: Negative.     Per HPI unless specifically indicated above     Objective:    BP 111/71 (BP Location: Right Arm, Patient Position: Sitting, Cuff Size: Normal)   Pulse 81   Temp 98.6 F (37 C)   Wt 126 lb (57.2 kg)   LMP 12/30/2015   SpO2 99%   BMI 22.32 kg/m   Wt Readings from Last 3 Encounters:  01/04/16 126 lb (57.2  kg)  12/11/15 124 lb 9.6 oz (56.5 kg)  12/01/15 128 lb (58.1 kg)    Physical Exam  Constitutional: She is oriented to person, place, and time. She appears well-developed and well-nourished. No distress.  HENT:  Head: Atraumatic.  Right Ear: External ear normal.  Left Ear: External ear normal.  Nose: Nose normal.  Mouth/Throat: Oropharynx is clear and moist. No oropharyngeal exudate.  Eyes: Conjunctivae are normal. No scleral icterus.  Neck: Normal range of motion. Neck supple.  Cardiovascular: Normal rate, regular rhythm and normal heart sounds.   Pulmonary/Chest: Breath sounds normal. No respiratory distress.  Abdominal: Soft. Bowel sounds are normal.  Musculoskeletal: Normal range of motion.  Neurological: She is alert and oriented to person,  place, and time.  Skin: Skin is warm and dry.  Psychiatric: She has a normal mood and affect. Her behavior is normal.  Nursing note and vitals reviewed.     Assessment & Plan:   Problem List Items Addressed This Visit    None    Visit Diagnoses    Irregular bleeding    -  Primary   Will check HCG quantitative today as patient would like reassurance. Continue to monitor   Relevant Orders   HCG, Tumor Marker   Acute maxillary sinusitis, recurrence not specified       Resolving. Continue mucinex and sinus rinses prn. Start taking zyrtec daily, and use flonase prn       Follow up plan: Return if symptoms worsen or fail to improve.

## 2016-01-04 NOTE — Patient Instructions (Signed)
Follow up as needed

## 2016-01-05 LAB — BETA HCG QUANT (REF LAB): hCG Quant: <1 m[IU]/mL

## 2016-02-15 NOTE — L&D Delivery Note (Signed)
Patient is 31 y.o. G2P0010 [id number] admitted for IOL for cholestasis of pregnancy.   Delivery Note At 5:15 PM a viable female was delivered via Vaginal, Spontaneous (Presentation: cephalic;LOA  ).  APGAR: 8, 9; weight pending .   Placenta status: spontaneous, intact .  Cord: 3 vessel   Anesthesia:  epidural Episiotomy:  none Lacerations: left Perirurethral with bleeding- hemostatic after repair Suture Repair: 2.0 vicryl Est. Blood Loss (mL):  350  Had lower uterine atony following delivery. Patient was given methergine IM x1. Fundus firm and bleeding stopped.   Mom to postpartum.  Baby to Couplet care / Skin to Skin.  [employer] Kaisha Wachob 01/12/2017, 5:49 PM

## 2016-02-18 ENCOUNTER — Other Ambulatory Visit: Payer: Self-pay | Admitting: Family Medicine

## 2016-02-23 ENCOUNTER — Encounter: Payer: Self-pay | Admitting: Family Medicine

## 2016-02-23 ENCOUNTER — Ambulatory Visit (INDEPENDENT_AMBULATORY_CARE_PROVIDER_SITE_OTHER): Payer: BLUE CROSS/BLUE SHIELD | Admitting: Family Medicine

## 2016-02-23 VITALS — BP 112/70 | HR 76 | Temp 98.7°F | Wt 126.8 lb

## 2016-02-23 DIAGNOSIS — Z3169 Encounter for other general counseling and advice on procreation: Secondary | ICD-10-CM

## 2016-02-23 DIAGNOSIS — M62838 Other muscle spasm: Secondary | ICD-10-CM | POA: Diagnosis not present

## 2016-02-23 NOTE — Progress Notes (Signed)
BP 112/70 (BP Location: Left Arm, Patient Position: Sitting, Cuff Size: Normal)   Pulse 76   Temp 98.7 F (37.1 C)   Wt 126 lb 12.8 oz (57.5 kg)   LMP 02/16/2016 (Approximate)   SpO2 100%   BMI 22.46 kg/m    Subjective:    Patient ID: Angela [name], female    DOB: Mar 26, 1985, 31 y.o.   MRN: [id number]  HPI: Angela [name] is a 31 y.o. female  Chief Complaint  Patient presents with  . conception concerns    Patient would like to see a reproductive specialist, she states that he does not like her OB and they he is not nice nor understanding, she states that everytime that she calls the office they down play her symptoms and push them off on ovaluation. She states that she will have heavy bleeding in the middle of the month for no reason.   . Other    Patient states that she went for a massage and he mentioned Thoracic Outlet Syndome   Went to have a massage done and they told her that they were concerned about her having thoracic outlet syndrome. She was very concerned about this as her Raynauld's has been happening about daily. Neck has been tight. Works on the computer a lot. Tight, aching, no numbness or tingling.   Still trying to get pregnant. Very concerned. Not happy with the attention she has been getting about this from GYN. Would like to see a infertility specialist.   No other concerns or complaints at this time.   Relevant past medical, surgical, family and social history reviewed and updated as indicated. Interim medical history since our last visit reviewed. Allergies and medications reviewed and updated.  Review of Systems  Constitutional: Negative.   Respiratory: Negative.   Musculoskeletal: Positive for myalgias, neck pain and neck stiffness. Negative for arthralgias, back pain, gait problem and joint swelling.  Psychiatric/Behavioral: Negative for agitation, behavioral problems, confusion, decreased concentration, dysphoric mood, hallucinations,  self-injury, sleep disturbance and suicidal ideas. The patient is nervous/anxious. The patient is not hyperactive.     Per HPI unless specifically indicated above     Objective:    BP 112/70 (BP Location: Left Arm, Patient Position: Sitting, Cuff Size: Normal)   Pulse 76   Temp 98.7 F (37.1 C)   Wt 126 lb 12.8 oz (57.5 kg)   LMP 02/16/2016 (Approximate)   SpO2 100%   BMI 22.46 kg/m   Wt Readings from Last 3 Encounters:  02/23/16 126 lb 12.8 oz (57.5 kg)  01/04/16 126 lb (57.2 kg)  12/11/15 124 lb 9.6 oz (56.5 kg)    Physical Exam  Constitutional: She is oriented to person, place, and time. She appears well-developed and well-nourished. No distress.  HENT:  Head: Normocephalic and atraumatic.  Right Ear: Hearing normal.  Left Ear: Hearing normal.  Nose: Nose normal.  Eyes: Conjunctivae and lids are normal. Right eye exhibits no discharge. Left eye exhibits no discharge. No scleral icterus.  Cardiovascular: Normal rate, regular rhythm, normal heart sounds and intact distal pulses.  Exam reveals no gallop and no friction rub.   No murmur heard. Pulmonary/Chest: Effort normal and breath sounds normal. No respiratory distress. She has no wheezes. She has no rales. She exhibits no tenderness.  Musculoskeletal: Normal range of motion.  Trap and SCM spasms bilaterally R>L Negative Addson's  Neurological: She is alert and oriented to person, place, and time.  Skin: Skin is warm, dry and intact.  No rash noted. No erythema. No pallor.  Psychiatric: She has a normal mood and affect. Her speech is normal and behavior is normal. Judgment and thought content normal. Cognition and memory are normal.  Nursing note and vitals reviewed.   Results for orders placed or performed in visit on 01/04/16  HCG, Tumor Marker  Result Value Ref Range   hCG Quant <1 mIU/mL      Assessment & Plan:   Problem List Items Addressed This Visit    None    Visit Diagnoses    Infertility counseling     -  Primary   Unhappy with her GYN. Would like to see infertility. Referral made today.   Relevant Orders   Ambulatory referral to Infertility   Trapezius muscle spasm       Reassured patient. Exercises given today. Call with any concerns. Continue to see massage as needed.        Follow up plan: Return if symptoms worsen or fail to improve.

## 2016-02-29 ENCOUNTER — Ambulatory Visit: Payer: BLUE CROSS/BLUE SHIELD | Admitting: Family Medicine

## 2016-03-14 ENCOUNTER — Telehealth: Payer: Self-pay | Admitting: Family Medicine

## 2016-03-15 NOTE — Telephone Encounter (Signed)
[facility], the Doctors to review then they will contact patient.  Called patient, explained if she hasn't heard anything by Friday 03/18/2016 to give me a call.

## 2016-03-15 NOTE — Telephone Encounter (Signed)
[facility] and explained that the patient would have to contact her OBGYN and get a referral made to an infertility clinic. [facility]  Patient notified. Dr. Laural BenesJohnson notified.

## 2016-03-17 ENCOUNTER — Telehealth: Payer: Self-pay | Admitting: Radiology

## 2016-03-17 NOTE — Telephone Encounter (Signed)
Left message on patient's cell phone voicemail, to call office back to schedule appointment with Dr Shawnie PonsPratt or Dr Macon LargeAnyanwu for Infertility consulting

## 2016-03-21 ENCOUNTER — Encounter: Payer: Self-pay | Admitting: *Deleted

## 2016-03-23 ENCOUNTER — Ambulatory Visit (INDEPENDENT_AMBULATORY_CARE_PROVIDER_SITE_OTHER): Payer: BLUE CROSS/BLUE SHIELD | Admitting: Family Medicine

## 2016-03-23 ENCOUNTER — Encounter: Payer: Self-pay | Admitting: Family Medicine

## 2016-03-23 VITALS — BP 100/68 | HR 73 | Resp 18 | Ht 62.0 in | Wt 124.0 lb

## 2016-03-23 DIAGNOSIS — N979 Female infertility, unspecified: Secondary | ICD-10-CM

## 2016-03-23 NOTE — Patient Instructions (Signed)
Infertility Infertility is when you are unable to get pregnant (conceive) after a year of having sex regularly without using birth control. Infertility can also mean that a woman is not able to carry a pregnancy to full term. Both women and men can have fertility problems. What causes infertility? What Causes Infertility in Women?  There are many possible causes of infertility in women. For some women, the cause of infertility is not known (unexplained infertility). Infertility can also be linked to more than one cause. Infertility problems in women can be caused by problems with the menstrual cycle or reproductive organs, certain medical conditions, and factors related to lifestyle and age.  Problems with your menstrual cycle can interfere with your ovaries producing eggs (ovulation). This can make it difficult to get pregnant. This includes having a menstrual cycle that is very long, very short, or irregular.  Problems with reproductive organs can include:  An abnormally narrow cervix or a cervix that does not remain closed during a pregnancy.  A blockage in your fallopian tubes.  An abnormally shaped uterus.  Uterine fibroids. This is a tissue mass (tumor) that can develop on your uterus.  Medical conditions that can affect a woman's fertility include:  Polycystic ovarian syndrome (PCOS). This is a hormonal disorder that can cause small cysts to grow on your ovaries. This is the most common cause of infertility in women.  Endometriosis. This is a condition in which the tissue that lines your uterus (endometrium) grows outside of its normal location.  Primary ovary insufficiency. This is when your ovaries stop producing eggs and hormones before the age of 40.  Sexually transmitted diseases, such as chlamydia or gonorrhea. These infections can cause scarring in your fallopian tubes. This makes it difficult for eggs to reach your uterus.  Autoimmune disorders. These are disorders in  which your immune system attacks normal, healthy cells.  Hormone imbalances.  Other factors include:  Age. A woman's fertility declines with age, especially after her mid-30s.  Being under- or overweight.  Drinking too much alcohol.  Using drugs.  Exercising excessively.  Being exposed to environmental toxins, such as radiation, pesticides, and certain chemicals. What Causes Infertility in Men?  There are many causes of infertility in men. Infertility can be linked to more than one cause. Infertility problems in men can be caused by problems with sperm or the reproductive organs, certain medical conditions, and factors related to lifestyle and age. Some men have unexplained infertility.  Problems with sperm. Infertility can result if there is a problem producing:  Enough sperm (low sperm count).  Enough normally-shaped sperm (sperm morphology).  Sperm that are able to reach the egg (poor motility).  Infertility can also be caused by:  A problem with hormones.  Enlarged veins (varicoceles), cysts (spermatoceles), or tumors of the testicles.  Sexual dysfunction.  Injury to the testicles.  A birth defect, such as not having the tubes that carry sperm (vas deferens).  Medical conditions that can affect a man's fertility include:  Diabetes.  Cancer treatments, such as chemotherapy or radiation.  Klinefelter syndrome. This is an inherited genetic disorder.  Thyroid problems, such as an under- or overactive thyroid.  Cystic fibrosis.  Sexually transmitted diseases.  Other factors include:  Age. A man's fertility declines with age.  Drinking too much alcohol.  Using drugs.  Being exposed to environmental toxins, such as pesticides and lead. What are the symptoms of infertility? Being unable to get pregnant after one year of having regular   sex without using birth control is the only sign of infertility. How is infertility diagnosed? In order to be diagnosed  with infertility, both partners will have a physical exam. Both partners will also have an extensive medical and sexual history taken. If there is no obvious reason for infertility, additional tests may be done. What Tests Will Women Have?  Women may first have tests to check whether they are ovulating each month. The tests may include:  Blood tests to check hormone levels.  An ultrasound of the ovaries. This looks for possible problems on or in the ovaries.  Taking a small sample of the tissue that lines the uterus for examination under a microscope (endometrial biopsy). Women who are ovulating may have additional tests. These may include:  Hysterosalpingography.  This is an X-ray of the fallopian tubes and uterus taken after a specific type of dye is injected.  This test can show the shape of the uterus and whether the fallopian tubes are open.  Laparoscopy.  In this test, a lighted tube (laparoscope) is used to look for problems in the fallopian tubes and other female organs.  Transvaginal ultrasound.  This is an imaging test to check for abnormalities of the uterus and ovaries.  A health care provider can use this test to count the number of follicles on the ovaries.  Hysteroscopy.  This test involves using a lighted tube to examine the cervix and inside the uterus.  It is done to find any abnormalities inside the uterus. What Tests Will Men Have?  Tests for men's infertility includes:  Semen tests to check sperm count, morphology, and motility.  Blood tests to check for hormone levels.  Taking a small sample of tissue from inside a testicle (biopsy). This is examined under a microscope.  Blood tests to check for genetic abnormalities (genetic testing). How are women treated for infertility? Treatment depends on the cause of infertility. Most cases of infertility in women are treated with medicine or surgery.  Women may take medicine to:  Correct ovulation  problems.  Treat other health conditions, such as PCOS.  Surgery may be done to:  Repair damage to the ovaries, fallopian tubes, cervix, or uterus.  Remove growths from the uterus.  Remove scar tissue from the uterus, pelvis, or other female organs. How are men treated for infertility? Treatment depends on the cause of infertility. Most cases of infertility in men are treated with medicine or surgery.  Men may take medicine to:  Correct hormone problems.  Treat other health conditions.  Treat sexual dysfunction.  Surgery may be done to:  Remove blockages in the reproductive tract.  Correct other structural problems of the reproductive tract. What is assisted reproductive technology? Assisted reproductive technology (ART) refers to all treatments and procedures that combine eggs and sperm outside the body to try to help a couple conceive. ART is often combined with fertility drugs to stimulate ovulation. Sometimes ART is done using eggs retrieved from another woman's body (donor eggs) or from previously frozen fertilized eggs (embryos). There are different types of ART. These include:  Intrauterine insemination (IUI).  In this procedure, sperm is placed directly into a woman's uterus with a long, thin tube.  This may be most effective for infertility caused by sperm problems, including low sperm count and low motility.  Can be used in combination with fertility drugs.  In vitro fertilization (IVF).  This is often done when a woman's fallopian tubes are blocked or when a man has   low sperm counts.  Fertility drugs stimulate the ovaries to produce multiple eggs. Once mature, these eggs are removed from the body and combined with the sperm to be fertilized.  These fertilized eggs are then placed in the woman's uterus. This information is not intended to replace advice given to you by your health care provider. Make sure you discuss any questions you have with your health care  provider. Document Released: 02/03/2003 Document Revised: 07/03/2015 Document Reviewed: 10/16/2013 Elsevier Interactive Patient Education  2017 [employer] Inc.  

## 2016-03-23 NOTE — Progress Notes (Signed)
   Subjective:    Patient ID: Angela [name] is a 31 y.o. female presenting with Infertility  on 03/23/2016  HPI: Married in 12/2013. Pregnancy in 12/15. SAB in 02/16.  Had oral cytotec. No h/o prior uterine surgery. Trying to get pregnant and no pregnancy since. [facility] and states normal progesterone. Husband had semen analysis and the count was normal with only 50% motility, normal is > 60%. Cycles are q 27-28 days. They last 5 days. Some cramping which has improved over time. Does feel ovulation. Has had ovulation prediction kits and is ovulating regularly. No uterine/abdominal surgical history. No STD or PID history.  Records from previous w/u reviewed and patient has a h/o Raynaud's with previous + ANA and was seen by Rheumatology and had negative RF, negative ANA, negative lupus anticoagulant and negative anti-cardiolipin antibodies.  Review of Systems  Constitutional: Negative for chills and fever.  Respiratory: Negative for shortness of breath.   Cardiovascular: Negative for chest pain.  Gastrointestinal: Negative for abdominal pain, nausea and vomiting.  Genitourinary: Negative for dysuria.  Skin: Negative for rash.      Objective:    BP 100/68 (BP Location: Left Arm, Patient Position: Sitting, Cuff Size: Normal)   Pulse 73   Resp 18   Ht 5\' 2"  (1.575 m)   Wt 124 lb (56.2 kg)   LMP 03/11/2016   BMI 22.68 kg/m  Physical Exam  Constitutional: She is oriented to person, place, and time. She appears well-developed and well-nourished. No distress.  HENT:  Head: Normocephalic and atraumatic.  Eyes: No scleral icterus.  Neck: Neck supple.  Cardiovascular: Normal rate.   Pulmonary/Chest: Effort normal.  Abdominal: Soft.  Neurological: She is alert and oriented to person, place, and time.  Skin: Skin is warm and dry.  Psychiatric: She has a normal mood and affect.        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 Will perform HSG, which might allow pregnancy. If not, will refer to REI.      Relevant Orders   DG Hysterogram (HSG)     Call with onset of menses to schedule HSG. Total face-to-face time with patient: 30 minutes. Over 50% of encounter was spent on counseling and coordination of care. Return in about 2 months (around 05/21/2016).  Reva Boresanya S Tykia Mellone 03/23/2016 4:46 PM

## 2016-03-24 DIAGNOSIS — N979 Female infertility, unspecified: Secondary | ICD-10-CM | POA: Insufficient documentation

## 2016-03-24 NOTE — Assessment & Plan Note (Addendum)
Partial w/u completed. Will perform HSG, which might allow pregnancy. If not, will refer to REI.

## 2016-04-14 ENCOUNTER — Ambulatory Visit (HOSPITAL_COMMUNITY): Payer: BLUE CROSS/BLUE SHIELD

## 2016-05-04 ENCOUNTER — Ambulatory Visit (HOSPITAL_COMMUNITY)
Admission: RE | Admit: 2016-05-04 | Discharge: 2016-05-04 | Disposition: A | Discharge status: Home / Self Care | Payer: BLUE CROSS/BLUE SHIELD | Source: Ambulatory Visit | Attending: Family Medicine | Admitting: Family Medicine

## 2016-05-04 DIAGNOSIS — N979 Female infertility, unspecified: Secondary | ICD-10-CM

## 2016-05-04 MED ORDER — IOPAMIDOL (ISOVUE-300) INJECTION 61%
30.0000 mL | Freq: Once | INTRAVENOUS | Status: AC | PRN
  Administered 2016-05-04: 30 mL

## 2016-05-19 ENCOUNTER — Ambulatory Visit: Payer: BLUE CROSS/BLUE SHIELD | Admitting: Family Medicine

## 2016-05-19 NOTE — Progress Notes (Signed)
BP 106/70 (BP Location: Left Arm, Patient Position: Sitting, Cuff Size: Normal)   Pulse 76   Temp 99 F (37.2 C) (Oral)   Resp 17   Ht  (1.575 m)   Wt 123 lb (55.8 kg)   LMP 04/27/2016   SpO2 100%   BMI 22.50 kg/m    Subjective:    Patient ID: Angela [name], female    DOB: [birth year], 31 y.o.   MRN: [id number]  HPI: Angela [name] is a 31 y.o. female  Chief Complaint  Patient presents with  . Depression   DEPRESSION Mood status: controlled Satisfied with current treatment?: yes Symptom severity: mild  Duration of current treatment : chronic Side effects: no Medication compliance: excellent compliance Psychotherapy/counseling: no  Previous psychiatric medications: celexa Depressed mood: no Anxious mood: no Anhedonia: no Significant weight loss or gain: no Insomnia: no  Fatigue: no Feelings of worthlessness or guilt: no Impaired concentration/indecisiveness: no Suicidal ideations: no Hopelessness: no Crying spells: no Depression screen [facility] 2/9 05/20/2016 11/18/2015 05/14/2015  Decreased Interest 0 1 0  Down, Depressed, Hopeless 0 0 2  PHQ - 2 Score 0 1 2  Altered sleeping - 0 1  Tired, decreased energy - 1 1  Change in appetite - 0 0  Feeling bad or failure about yourself  - 0 0  Trouble concentrating - 1 1  Moving slowly or fidgety/restless - 0 0  Suicidal thoughts - 0 0  PHQ-9 Score - 3 5  Difficult doing work/chores - - Not difficult at all    Relevant past medical, surgical, family and social history reviewed and updated as indicated. Interim medical history since our last visit reviewed. Allergies and medications reviewed and updated.  Review of Systems  Constitutional: Negative.   Respiratory: Negative.   Cardiovascular: Negative.   Psychiatric/Behavioral: Negative.     Per HPI unless specifically indicated above     Objective:    BP 106/70 (BP Location: Left Arm, Patient Position: Sitting, Cuff Size: Normal)   Pulse 76   Temp 99  F (37.2 C) (Oral)   Resp 17   Ht  (1.575 m)   Wt 123 lb (55.8 kg)   LMP 04/27/2016   SpO2 100%   BMI 22.50 kg/m   Wt Readings from Last 3 Encounters:  05/20/16 123 lb (55.8 kg)  03/23/16 124 lb (56.2 kg)  02/23/16 126 lb 12.8 oz (57.5 kg)    Physical Exam  Constitutional: She is oriented to person, place, and time. She appears well-developed and well-nourished. No distress.  HENT:  Head: Normocephalic and atraumatic.  Right Ear: Hearing normal.  Left Ear: Hearing normal.  Nose: Nose normal.  Eyes: Conjunctivae and lids are normal. Right eye exhibits no discharge. Left eye exhibits no discharge. No scleral icterus.  Cardiovascular: Normal rate, regular rhythm, normal heart sounds and intact distal pulses.  Exam reveals no gallop and no friction rub.   No murmur heard. Pulmonary/Chest: Effort normal and breath sounds normal. No respiratory distress. She has no wheezes. She has no rales. She exhibits no tenderness.  Musculoskeletal: Normal range of motion.  Neurological: She is alert and oriented to person, place, and time.  Skin: Skin is warm, dry and intact. No rash noted. No erythema. No pallor.  Psychiatric: She has a normal mood and affect. Her speech is normal and behavior is normal. Judgment and thought content normal. Cognition and memory are normal.  Nursing note and vitals reviewed.   Results for orders  placed or performed in visit on 01/04/16  HCG, Tumor Marker  Result Value Ref Range   hCG Quant <1 mIU/mL      Assessment & Plan:   Problem List Items Addressed This Visit      Other   Depression with anxiety    Under good control. Continue current regimen. Will change to zoloft if she gets pregnant. Call with any concerns. Refills given.       Depression - Primary   Relevant Medications   citalopram (CELEXA) 20 MG tablet   Anxiety    Under good control. Continue current regimen. Will change to zoloft if she gets pregnant. Call with any concerns. Refills  given.       Relevant Medications   citalopram (CELEXA) 20 MG tablet       Follow up plan: Return in about 6 months (around 11/19/2016) for Physical.

## 2016-05-20 ENCOUNTER — Ambulatory Visit (INDEPENDENT_AMBULATORY_CARE_PROVIDER_SITE_OTHER): Payer: BLUE CROSS/BLUE SHIELD | Admitting: Family Medicine

## 2016-05-20 ENCOUNTER — Encounter: Payer: Self-pay | Admitting: Family Medicine

## 2016-05-20 VITALS — BP 106/70 | HR 76 | Temp 99.0°F | Resp 17 | Ht 62.0 in | Wt 123.0 lb

## 2016-05-20 DIAGNOSIS — F419 Anxiety disorder, unspecified: Secondary | ICD-10-CM | POA: Diagnosis not present

## 2016-05-20 DIAGNOSIS — F418 Other specified anxiety disorders: Secondary | ICD-10-CM

## 2016-05-20 DIAGNOSIS — F3341 Major depressive disorder, recurrent, in partial remission: Secondary | ICD-10-CM | POA: Diagnosis not present

## 2016-05-20 MED ORDER — CITALOPRAM HYDROBROMIDE 20 MG PO TABS: 20.0000 mg | ORAL_TABLET | Freq: Every day | ORAL | 1 refills | Status: DC

## 2016-05-20 NOTE — Assessment & Plan Note (Signed)
Under good control. Continue current regimen. Will change to zoloft if she gets pregnant. Call with any concerns. Refills given.

## 2016-05-20 NOTE — Assessment & Plan Note (Signed)
Under good control. Continue current regimen. Will change to zoloft if she gets pregnant. Call with any concerns. Refills given.  

## 2016-05-24 ENCOUNTER — Ambulatory Visit: Payer: BLUE CROSS/BLUE SHIELD | Admitting: Family Medicine

## 2016-05-24 ENCOUNTER — Encounter: Payer: Self-pay | Admitting: Family Medicine

## 2016-05-24 ENCOUNTER — Other Ambulatory Visit: Payer: Self-pay | Admitting: Family Medicine

## 2016-05-24 MED ORDER — SERTRALINE HCL 50 MG PO TABS: 50.0000 mg | ORAL_TABLET | Freq: Every day | ORAL | 3 refills | Status: DC

## 2016-05-24 NOTE — Telephone Encounter (Signed)
Spoke to pt via phone, states LMP was 04-26-16.  Scheduled New OB appt for 06-21-16 and instructed to take PNV and to call the office if she experiences any vaginal bleeding or severe abdominal pain.  Pt acknowledged instructions.

## 2016-06-07 ENCOUNTER — Ambulatory Visit: Payer: BLUE CROSS/BLUE SHIELD | Admitting: Family Medicine

## 2016-06-14 ENCOUNTER — Ambulatory Visit (INDEPENDENT_AMBULATORY_CARE_PROVIDER_SITE_OTHER): Payer: BLUE CROSS/BLUE SHIELD | Admitting: Obstetrics & Gynecology

## 2016-06-14 ENCOUNTER — Encounter: Payer: Self-pay | Admitting: Obstetrics & Gynecology

## 2016-06-14 VITALS — BP 110/72 | HR 74 | Wt 128.0 lb

## 2016-06-14 DIAGNOSIS — Z3480 Encounter for supervision of other normal pregnancy, unspecified trimester: Secondary | ICD-10-CM | POA: Diagnosis not present

## 2016-06-14 DIAGNOSIS — Z3481 Encounter for supervision of other normal pregnancy, first trimester: Secondary | ICD-10-CM

## 2016-06-14 DIAGNOSIS — Z3689 Encounter for other specified antenatal screening: Secondary | ICD-10-CM

## 2016-06-14 NOTE — Progress Notes (Signed)
Neg pap 05/07/14

## 2016-06-14 NOTE — Progress Notes (Signed)
Subjective:   Angela [name] is a 31 y.o. G2P0010 at [redacted]w[redacted]d by LMP being seen today for her first obstetrical visit.  Her obstetrical history is significant for recent SAB and infertility; however, she conceived after HSG. Also has history of Raynaud's with negative antibody evaluation.  Taking Zoloft for history of depression and anxiety. Patient does intend to breast feed. Pregnancy history fully reviewed.  Patient reports no complaints.  HISTORY: Obstetric History   G2   P0   T0   P0   A1   L0    SAB1   TAB0   Ectopic0   Multiple0   Live Births0     # Outcome Date GA Lbr Len/2nd Weight Sex Delivery Anes PTL Lv  2 Current           1 SAB 03/26/14 [redacted]w[redacted]d            Past Medical History:  Diagnosis Date  . Bilateral carpal tunnel syndrome 11/18/2015  . Depression with anxiety 05/07/2014  . GERD (gastroesophageal reflux disease)   . IBS (irritable bowel syndrome)   . Migraines   . Missed abortion 03/26/2014  . Multiple thyroid nodules   . Painful menstrual periods 11/13/2014  . Raynaud disease    Past Surgical History:  Procedure Laterality Date  . COLONOSCOPY  11/03/11   [place]:colonic mucosa appeared normal  . DG SELECTED HSG [facility] ONLY    . WISDOM TOOTH EXTRACTION     Family History  Problem Relation Age of Onset  . Diabetes Father   . Heart disease Father   . Alcohol abuse Father   . Hypertension Father   . Rheum arthritis Mother   . Arthritis Mother     rheumatoid  . Diabetes Paternal Grandfather   . Congestive Heart Failure Paternal Grandfather   . Alzheimer's disease Paternal Grandmother   . Other Maternal Grandmother     obstruction in stomach  . Stroke Maternal Grandfather   . Diabetes Maternal Grandfather   . Diabetes Sister   . Hypertension Sister   . Hypertension Brother   . Colon cancer Neg Hx   . Esophageal cancer Neg Hx   . Rectal cancer Neg Hx   . Stomach cancer Neg Hx    Social History  Substance Use Topics  . Smoking status: Current Every  Day Smoker    Packs/day: 0.25    Years: 10.00    Types: Cigarettes  . Smokeless tobacco: Never Used     Comment: 5 cigs per day  . Alcohol use Yes     Comment: occ wine   No Known Allergies Current Outpatient Prescriptions on File Prior to Visit  Medication Sig Dispense Refill  . aspirin EC 81 MG tablet Take 81 mg by mouth daily.    . Prenatal Vit-Fe Fumarate-FA (PRENATAL MULTIVITAMIN) TABS tablet Take 1 tablet by mouth daily at 12 noon.    . sertraline (ZOLOFT) 50 MG tablet Take 1 tablet (50 mg total) by mouth daily. 30 tablet 3   No current facility-administered medications on file prior to visit.      Exam   Vitals:   06/14/16 1027  BP: 110/72  Pulse: 74  Weight: 128 lb (58.1 kg)   Fetal Heart Rate (bpm): 150 on bedside scan; CRL ~[redacted]w[redacted]d  Uterus:     Pelvic Exam: Perineum: no hemorrhoids, normal perineum   Vulva: normal external genitalia, no lesions   Vagina:  normal mucosa, normal discharge  System: General: well-developed,  well-nourished female in no acute distress   Breast:  normal appearance, no masses or tenderness   Skin: normal coloration and turgor, no rashes   Neurologic: oriented, normal, negative, normal mood   Extremities: normal strength, tone, and muscle mass, ROM of all joints is normal   HEENT PERRLA, extraocular movement intact and sclera clear, anicteric   Mouth/Teeth mucous membranes moist, pharynx normal without lesions and dental hygiene good   Neck supple and no masses   Cardiovascular: regular rate and rhythm   Respiratory:  no respiratory distress, normal breath sounds   Abdomen: soft, non-tender; bowel sounds normal; no masses,  no organomegaly     Assessment:   Pregnancy: G2P0010 Patient Active Problem List   Diagnosis Date Noted  . Supervision of other normal pregnancy, antepartum 06/14/2016  . Bilateral carpal tunnel syndrome 11/18/2015  . IBS (irritable bowel syndrome) 05/07/2014  . Raynaud disease 05/07/2014  . Depression with  anxiety 05/07/2014  . Smoker 05/07/2014     Plan:  1. Supervision of other normal pregnancy, antepartum - Obstetric Panel, Including HIV - Culture, OB Urine - GC/Chlamydia probe amp ([place])not at [facility] bedside Initial labs drawn. Continue prenatal vitamins. Genetic Screening discussed, First trimester screen, Quad screen and NIPS: declined. Ultrasound discussed; fetal anatomic survey: to be ordered later. Problem list reviewed and updated. The nature of [facility] with multiple MDs and other Advanced Practice Providers was explained to patient; also emphasized that residents, students are part of our team. Routine obstetric precautions reviewed. Signed up for Babyscripts program. Return in about 5 weeks (around 07/19/2016) for OB 12 week visit (Babyscripts).     Jaynie Collins, MD, FACOG Attending Obstetrician & Gynecologist, [facility] for [employer], [facility]

## 2016-06-14 NOTE — Progress Notes (Signed)
Bedside US shows SLIUP measuring [redacted]w[redacted]d CRL and FHR 150bpm

## 2016-06-14 NOTE — Patient Instructions (Signed)
Return to clinic for any scheduled appointments or obstetric concerns, or go to [facility] for evaluation   First Trimester of Pregnancy The first trimester of pregnancy is from week 1 until the end of week 13 (months 1 through 3). A week after a sperm fertilizes an egg, the egg will implant on the wall of the uterus. This embryo will begin to develop into a baby. Genes from you and your partner will form the baby. The female genes will determine whether the baby will be a boy or a girl. At 6-8 weeks, the eyes and face will be formed, and the heartbeat can be seen on ultrasound. At the end of 12 weeks, all the baby's organs will be formed. Now that you are pregnant, you will want to do everything you can to have a healthy baby. Two of the most important things are to get good prenatal care and to follow your health care provider's instructions. Prenatal care is all the medical care you receive before the baby's birth. This care will help prevent, find, and treat any problems during the pregnancy and childbirth. Body changes during your first trimester Your body goes through many changes during pregnancy. The changes vary from woman to woman.  You may gain or lose a couple of pounds at first.  You may feel sick to your stomach (nauseous) and you may throw up (vomit). If the vomiting is uncontrollable, call your health care provider.  You may tire easily.  You may develop headaches that can be relieved by medicines. All medicines should be approved by your health care provider.  You may urinate more often. Painful urination may mean you have a bladder infection.  You may develop heartburn as a result of your pregnancy.  You may develop constipation because certain hormones are causing the muscles that push stool through your intestines to slow down.  You may develop hemorrhoids or swollen veins (varicose veins).  Your breasts may begin to grow larger and become tender. Your nipples may stick out more,  and the tissue that surrounds them (areola) may become darker.  Your gums may bleed and may be sensitive to brushing and flossing.  Dark spots or blotches (chloasma, mask of pregnancy) may develop on your face. This will likely fade after the baby is born.  Your menstrual periods will stop.  You may have a loss of appetite.  You may develop cravings for certain kinds of food.  You may have changes in your emotions from day to day, such as being excited to be pregnant or being concerned that something may go wrong with the pregnancy and baby.  You may have more vivid and strange dreams.  You may have changes in your hair. These can include thickening of your hair, rapid growth, and changes in texture. Some women also have hair loss during or after pregnancy, or hair that feels dry or thin. Your hair will most likely return to normal after your baby is born. What to expect at prenatal visits During a routine prenatal visit:  You will be weighed to make sure you and the baby are growing normally.  Your blood pressure will be taken.  Your abdomen will be measured to track your baby's growth.  The fetal heartbeat will be listened to between weeks 10 and 14 of your pregnancy.  Test results from any previous visits will be discussed. Your health care provider may ask you:  How you are feeling.  If you are feeling the baby  move.  If you have had any abnormal symptoms, such as leaking fluid, bleeding, severe headaches, or abdominal cramping.  If you are using any tobacco products, including cigarettes, chewing tobacco, and electronic cigarettes.  If you have any questions. Other tests that may be performed during your first trimester include:  Blood tests to find your blood type and to check for the presence of any previous infections. The tests will also be used to check for low iron levels (anemia) and protein on red blood cells (Rh antibodies). Depending on your risk factors, or  if you previously had diabetes during pregnancy, you may have tests to check for high blood sugar that affects pregnant women (gestational diabetes).  Urine tests to check for infections, diabetes, or protein in the urine.  An ultrasound to confirm the proper growth and development of the baby.  Fetal screens for spinal cord problems (spina bifida) and Down syndrome.  HIV (human immunodeficiency virus) testing. Routine prenatal testing includes screening for HIV, unless you choose not to have this test.  You may need other tests to make sure you and the baby are doing well. Follow these instructions at home: Medicines   Follow your health care provider's instructions regarding medicine use. Specific medicines may be either safe or unsafe to take during pregnancy.  Take a prenatal vitamin that contains at least 600 micrograms (mcg) of folic acid.  If you develop constipation, try taking a stool softener if your health care provider approves. Eating and drinking   Eat a balanced diet that includes fresh fruits and vegetables, whole grains, good sources of protein such as meat, eggs, or tofu, and low-fat dairy. Your health care provider will help you determine the amount of weight gain that is right for you.  Avoid raw meat and uncooked cheese. These carry germs that can cause birth defects in the baby.  Eating four or five small meals rather than three large meals a day may help relieve nausea and vomiting. If you start to feel nauseous, eating a few soda crackers can be helpful. Drinking liquids between meals, instead of during meals, also seems to help ease nausea and vomiting.  Limit foods that are high in fat and processed sugars, such as fried and sweet foods.  To prevent constipation:  Eat foods that are high in fiber, such as fresh fruits and vegetables, whole grains, and beans.  Drink enough fluid to keep your urine clear or pale yellow. Activity   Exercise only as directed  by your health care provider. Most women can continue their usual exercise routine during pregnancy. Try to exercise for 30 minutes at least 5 days a week. Exercising will help you:  Control your weight.  Stay in shape.  Be prepared for labor and delivery.  Experiencing pain or cramping in the lower abdomen or lower back is a good sign that you should stop exercising. Check with your health care provider before continuing with normal exercises.  Try to avoid standing for long periods of time. Move your legs often if you must stand in one place for a long time.  Avoid heavy lifting.  Wear low-heeled shoes and practice good posture.  You may continue to have sex unless your health care provider tells you not to. Relieving pain and discomfort   Wear a good support bra to relieve breast tenderness.  Take warm sitz baths to soothe any pain or discomfort caused by hemorrhoids. Use hemorrhoid cream if your health care provider approves.  Rest with your legs elevated if you have leg cramps or low back pain.  If you develop varicose veins in your legs, wear support hose. Elevate your feet for 15 minutes, 3-4 times a day. Limit salt in your diet. Prenatal care   Schedule your prenatal visits by the twelfth week of pregnancy. They are usually scheduled monthly at first, then more often in the last 2 months before delivery.  Write down your questions. Take them to your prenatal visits.  Keep all your prenatal visits as told by your health care provider. This is important. Safety   Wear your seat belt at all times when driving.  Make a list of emergency phone numbers, including numbers for family, friends, the hospital, and police and fire departments. General instructions   Ask your health care provider for a referral to a local prenatal education class. Begin classes no later than the beginning of month 6 of your pregnancy.  Ask for help if you have counseling or nutritional needs  during pregnancy. Your health care provider can offer advice or refer you to specialists for help with various needs.  Do not use hot tubs, steam rooms, or saunas.  Do not douche or use tampons or scented sanitary pads.  Do not cross your legs for long periods of time.  Avoid cat litter boxes and soil used by cats. These carry germs that can cause birth defects in the baby and possibly loss of the fetus by miscarriage or stillbirth.  Avoid all smoking, herbs, alcohol, and medicines not prescribed by your health care provider. Chemicals in these products affect the formation and growth of the baby.  Do not use any products that contain nicotine or tobacco, such as cigarettes and e-cigarettes. If you need help quitting, ask your health care provider. You may receive counseling support and other resources to help you quit.  Schedule a dentist appointment. At home, brush your teeth with a soft toothbrush and be gentle when you floss. Contact a health care provider if:  You have dizziness.  You have mild pelvic cramps, pelvic pressure, or nagging pain in the abdominal area.  You have persistent nausea, vomiting, or diarrhea.  You have a bad smelling vaginal discharge.  You have pain when you urinate.  You notice increased swelling in your face, hands, legs, or ankles.  You are exposed to fifth disease or chickenpox.  You are exposed to [place] measles (rubella) and have never had it. Get help right away if:  You have a fever.  You are leaking fluid from your vagina.  You have spotting or bleeding from your vagina.  You have severe abdominal cramping or pain.  You have rapid weight gain or loss.  You vomit blood or material that looks like coffee grounds.  You develop a severe headache.  You have shortness of breath.  You have any kind of trauma, such as from a fall or a car accident. Summary  The first trimester of pregnancy is from week 1 until the end of week 13  (months 1 through 3).  Your body goes through many changes during pregnancy. The changes vary from woman to woman.  You will have routine prenatal visits. During those visits, your health care provider will examine you, discuss any test results you may have, and talk with you about how you are feeling. This information is not intended to replace advice given to you by your health care provider. Make sure you discuss any questions you have  with your health care provider. Document Released: 01/25/2001 Document Revised: 01/13/2016 Document Reviewed: 01/13/2016 Elsevier Interactive Patient Education  2017 [employer].

## 2016-06-16 LAB — OBSTETRIC PANEL, INCLUDING HIV
ANTIBODY SCREEN: NEGATIVE
BASOS: 0 %
Basophils Absolute: 0 10*3/uL (ref 0.0–0.2)
EOS (ABSOLUTE): 0 10*3/uL (ref 0.0–0.4)
Eos: 0 %
HEMATOCRIT: 36.5 % (ref 34.0–46.6)
HIV SCREEN 4TH GENERATION: NONREACTIVE
Hemoglobin: 12 g/dL (ref 11.1–15.9)
Hepatitis B Surface Ag: NEGATIVE
Immature Grans (Abs): 0 10*3/uL (ref 0.0–0.1)
Immature Granulocytes: 0 %
Lymphocytes Absolute: 1.1 10*3/uL (ref 0.7–3.1)
Lymphs: 31 %
MCH: 26.7 pg (ref 26.6–33.0)
MCHC: 32.9 g/dL (ref 31.5–35.7)
MCV: 81 fL (ref 79–97)
MONOCYTES: 9 %
Monocytes Absolute: 0.3 10*3/uL (ref 0.1–0.9)
Neutrophils Absolute: 2 10*3/uL (ref 1.4–7.0)
Neutrophils: 60 %
Platelets: 176 10*3/uL (ref 150–379)
RBC: 4.49 x10E6/uL (ref 3.77–5.28)
RDW: 14.5 % (ref 12.3–15.4)
RPR: NONREACTIVE
RUBELLA: 2.01 {index} (ref >0.99)
Rh Factor: POSITIVE
WBC: 3.4 10*3/uL (ref 3.4–10.8)

## 2016-06-16 LAB — CULTURE, OB URINE

## 2016-06-16 LAB — URINE CULTURE, OB REFLEX

## 2016-06-17 LAB — GC/CHLAMYDIA PROBE AMP (~~LOC~~) NOT AT ARMC
CHLAMYDIA, DNA PROBE: NEGATIVE
NEISSERIA GONORRHEA: NEGATIVE

## 2016-06-21 ENCOUNTER — Encounter: Payer: BLUE CROSS/BLUE SHIELD | Admitting: Family Medicine

## 2016-07-20 ENCOUNTER — Encounter: Payer: BLUE CROSS/BLUE SHIELD | Admitting: Obstetrics and Gynecology

## 2016-07-21 ENCOUNTER — Ambulatory Visit (INDEPENDENT_AMBULATORY_CARE_PROVIDER_SITE_OTHER): Payer: BLUE CROSS/BLUE SHIELD | Admitting: Obstetrics and Gynecology

## 2016-07-21 VITALS — BP 125/77 | HR 86 | Wt 133.0 lb

## 2016-07-21 DIAGNOSIS — R040 Epistaxis: Secondary | ICD-10-CM | POA: Insufficient documentation

## 2016-07-21 DIAGNOSIS — Z3481 Encounter for supervision of other normal pregnancy, first trimester: Secondary | ICD-10-CM

## 2016-07-21 DIAGNOSIS — Z3480 Encounter for supervision of other normal pregnancy, unspecified trimester: Secondary | ICD-10-CM

## 2016-07-21 NOTE — Progress Notes (Signed)
Pt c/o sciatic nerve pain.  Has tried stretches and heating pads to help with the discomfort.

## 2016-07-21 NOTE — Progress Notes (Addendum)
Prenatal Visit Note Date: 07/21/2016 Clinic: [facility]  Subjective:  Angela [name] is a 31 y.o. G2P0010 at 2473w2d being seen today for ongoing prenatal care.  She is currently monitored for the following issues for this high-risk pregnancy and has IBS (irritable bowel syndrome); Raynaud disease; Depression with anxiety; Smoker; Bilateral carpal tunnel syndrome; and Supervision of other normal pregnancy, antepartum on her problem list.  Patient reports occasional sciatic pain (see RN note).  Contractions: Not present. Vag. Bleeding: None.  Movement: Absent. Denies leaking of fluid.   The following portions of the patient's history were reviewed and updated as appropriate: allergies, current medications, past family history, past medical history, past social history, past surgical history and problem list. Problem list updated.  Objective:   Vitals:   07/21/16 0858  BP: 125/77  Pulse: 86  Weight: 133 lb (60.3 kg)    Fetal Status: Fetal Heart Rate (bpm): 154   Movement: Absent     General:  Alert, oriented and cooperative. Patient is in no acute distress.  Skin: Skin is warm and dry. No rash noted.   Cardiovascular: Normal heart rate noted  Respiratory: Normal respiratory effort, no problems with respiration noted  Abdomen: Soft, gravid, appropriate for gestational age. Pain/Pressure: Present     Pelvic:  Cervical exam deferred        Extremities: Normal range of motion.  Edema: None  Mental Status: Normal mood and affect. Normal behavior. Normal judgment and thought content.   Urinalysis:      Assessment and Plan:  Pregnancy: G2P0010 at 6573w2d  1. Supervision of other normal pregnancy, antepartum Routine care. D/w them re: genetic screening options and they elect for 1st trimester screen. Pt does sit a lot. Told about stretches and binder that can help and if it worsens later in pregnancy can refer to PT. afp and schedule anatomy u/s nv.  - US MFM Fetal  Nuchal Translucency; Future  2. Nosebleeds D/w her re: humidifier and saline mist and to d/c the ASA for 10d and see if that helps. Pt told to let us know if that helps or not. If so, pt told that we may keep holding it.   Preterm labor symptoms and general obstetric precautions including but not limited to vaginal bleeding, contractions, leaking of fluid and fetal movement were reviewed in detail with the patient. Please refer to After Visit Summary for other counseling recommendations.  No Follow-up on file.per baby scripts   [place] BingPickens, Allie Ousley, MD

## 2016-07-22 ENCOUNTER — Encounter (HOSPITAL_COMMUNITY): Payer: Self-pay | Admitting: Obstetrics and Gynecology

## 2016-07-25 ENCOUNTER — Encounter: Payer: Self-pay | Admitting: Radiology

## 2016-08-01 ENCOUNTER — Ambulatory Visit (HOSPITAL_COMMUNITY)
Admission: RE | Admit: 2016-08-01 | Discharge: 2016-08-01 | Disposition: A | Discharge status: Home / Self Care | Payer: BLUE CROSS/BLUE SHIELD | Source: Ambulatory Visit | Attending: Obstetrics and Gynecology | Admitting: Obstetrics and Gynecology

## 2016-08-01 ENCOUNTER — Encounter (HOSPITAL_COMMUNITY): Payer: Self-pay

## 2016-08-01 DIAGNOSIS — Z3682 Encounter for antenatal screening for nuchal translucency: Secondary | ICD-10-CM | POA: Insufficient documentation

## 2016-08-01 DIAGNOSIS — Z3480 Encounter for supervision of other normal pregnancy, unspecified trimester: Secondary | ICD-10-CM | POA: Insufficient documentation

## 2016-08-01 DIAGNOSIS — Z3A13 13 weeks gestation of pregnancy: Secondary | ICD-10-CM | POA: Diagnosis not present

## 2016-08-18 ENCOUNTER — Ambulatory Visit: Payer: BLUE CROSS/BLUE SHIELD | Admitting: *Deleted

## 2016-08-18 DIAGNOSIS — Z3402 Encounter for supervision of normal first pregnancy, second trimester: Secondary | ICD-10-CM

## 2016-08-18 NOTE — Progress Notes (Signed)
Pt is currently on Baby Scripts, requested to come in at 16 wks for a FHR check.  FHR = 143.  No problems at this time.  Scheduled anatomy US.

## 2016-08-22 ENCOUNTER — Telehealth: Payer: Self-pay | Admitting: *Deleted

## 2016-08-22 NOTE — Telephone Encounter (Signed)
Pt states she had an episode of dizziness and lightheadedness yesterday while she was standing at church.  Pt states she had OJ and waffles on her way to church.  Informed pt that at times the baby may put pressure on a main vessel which can cause a decrease in blood flow resulting in a vasovagal reaction and usually lying down on her left side will help blood flow.  Pt states she did that and it did get better at that time.  Also encouraged to push fluids which she did after the episode.  Instructed to call back for evaluation if she starts to experience the same symptoms again and it continues.  Pt acknowledged instructions.

## 2016-08-26 ENCOUNTER — Other Ambulatory Visit: Payer: Self-pay | Admitting: Family Medicine

## 2016-09-06 ENCOUNTER — Other Ambulatory Visit: Payer: Self-pay | Admitting: Obstetrics & Gynecology

## 2016-09-06 ENCOUNTER — Ambulatory Visit (HOSPITAL_COMMUNITY)
Admission: RE | Admit: 2016-09-06 | Discharge: 2016-09-06 | Disposition: A | Discharge status: Home / Self Care | Payer: BLUE CROSS/BLUE SHIELD | Source: Ambulatory Visit | Attending: Obstetrics & Gynecology | Admitting: Obstetrics & Gynecology

## 2016-09-06 DIAGNOSIS — Z3A19 19 weeks gestation of pregnancy: Secondary | ICD-10-CM | POA: Insufficient documentation

## 2016-09-06 DIAGNOSIS — Z363 Encounter for antenatal screening for malformations: Secondary | ICD-10-CM | POA: Insufficient documentation

## 2016-09-06 DIAGNOSIS — Z3402 Encounter for supervision of normal first pregnancy, second trimester: Secondary | ICD-10-CM

## 2016-09-12 ENCOUNTER — Ambulatory Visit (INDEPENDENT_AMBULATORY_CARE_PROVIDER_SITE_OTHER): Payer: BLUE CROSS/BLUE SHIELD | Admitting: Obstetrics and Gynecology

## 2016-09-12 VITALS — BP 104/67 | HR 82 | Wt 142.0 lb

## 2016-09-12 DIAGNOSIS — Z3480 Encounter for supervision of other normal pregnancy, unspecified trimester: Secondary | ICD-10-CM

## 2016-09-12 DIAGNOSIS — Z3482 Encounter for supervision of other normal pregnancy, second trimester: Secondary | ICD-10-CM

## 2016-09-12 NOTE — Progress Notes (Signed)
Prenatal Visit Note Date: 09/12/2016 Clinic: [facility]  Subjective:  Angela [name] is a 31 y.o. G2P0010 at 5529w6d being seen today for ongoing prenatal care.  She is currently monitored for the following issues for this low-risk pregnancy and has IBS (irritable bowel syndrome); Raynaud disease; Depression with anxiety; Smoker; Bilateral carpal tunnel syndrome; Supervision of other normal pregnancy, antepartum; and Epistaxis on her problem list.  Patient reports no complaints.   Contractions: Not present. Vag. Bleeding: None.  Movement: Present. Denies leaking of fluid.   The following portions of the patient's history were reviewed and updated as appropriate: allergies, current medications, past family history, past medical history, past social history, past surgical history and problem list. Problem list updated.  Objective:   Vitals:   09/12/16 1622  BP: 104/67  Pulse: 82  Weight: 142 lb (64.4 kg)    Fetal Status: Fetal Heart Rate (bpm): 130s   Movement: Present     General:  Alert, oriented and cooperative. Patient is in no acute distress.  Skin: Skin is warm and dry. No rash noted.   Cardiovascular: Normal heart rate noted  Respiratory: Normal respiratory effort, no problems with respiration noted  Abdomen: Soft, gravid, appropriate for gestational age. Pain/Pressure: Absent     Pelvic:  Cervical exam deferred        Extremities: Normal range of motion.  Edema: None  Mental Status: Normal mood and affect. Normal behavior. Normal judgment and thought content.   Urinalysis:      Assessment and Plan:  Pregnancy: G2P0010 at 4029w6d  1. Supervision of other normal pregnancy, antepartum Routine care. Declines genetics. Completion anatomy u/s in one month - US MFM OB FOLLOW UP; Future  Preterm labor symptoms and general obstetric precautions including but not limited to vaginal bleeding, contractions, leaking of fluid and fetal movement were reviewed in  detail with the patient. Please refer to After Visit Summary for other counseling recommendations.  No Follow-up on file.   [place] BingPickens, Angela Roberti, MD

## 2016-10-03 ENCOUNTER — Other Ambulatory Visit: Payer: Self-pay | Admitting: Family Medicine

## 2016-10-07 ENCOUNTER — Inpatient Hospital Stay (HOSPITAL_COMMUNITY)
Admission: AD | Admit: 2016-10-07 | Discharge: 2016-10-07 | Disposition: A | Discharge status: Home / Self Care | Payer: BLUE CROSS/BLUE SHIELD | Source: Ambulatory Visit | Attending: Obstetrics and Gynecology | Admitting: Obstetrics and Gynecology

## 2016-10-07 DIAGNOSIS — Z7982 Long term (current) use of aspirin: Secondary | ICD-10-CM | POA: Diagnosis not present

## 2016-10-07 DIAGNOSIS — F418 Other specified anxiety disorders: Secondary | ICD-10-CM | POA: Insufficient documentation

## 2016-10-07 DIAGNOSIS — O99412 Diseases of the circulatory system complicating pregnancy, second trimester: Secondary | ICD-10-CM | POA: Insufficient documentation

## 2016-10-07 DIAGNOSIS — G5603 Carpal tunnel syndrome, bilateral upper limbs: Secondary | ICD-10-CM | POA: Diagnosis not present

## 2016-10-07 DIAGNOSIS — O99342 Other mental disorders complicating pregnancy, second trimester: Secondary | ICD-10-CM | POA: Diagnosis not present

## 2016-10-07 DIAGNOSIS — O9989 Other specified diseases and conditions complicating pregnancy, childbirth and the puerperium: Secondary | ICD-10-CM | POA: Diagnosis not present

## 2016-10-07 DIAGNOSIS — K219 Gastro-esophageal reflux disease without esophagitis: Secondary | ICD-10-CM | POA: Diagnosis present

## 2016-10-07 DIAGNOSIS — I7300 Raynaud's syndrome without gangrene: Secondary | ICD-10-CM | POA: Diagnosis not present

## 2016-10-07 DIAGNOSIS — R109 Unspecified abdominal pain: Secondary | ICD-10-CM

## 2016-10-07 DIAGNOSIS — K5901 Slow transit constipation: Secondary | ICD-10-CM | POA: Diagnosis not present

## 2016-10-07 DIAGNOSIS — O99612 Diseases of the digestive system complicating pregnancy, second trimester: Secondary | ICD-10-CM | POA: Diagnosis not present

## 2016-10-07 DIAGNOSIS — R1032 Left lower quadrant pain: Secondary | ICD-10-CM | POA: Diagnosis not present

## 2016-10-07 DIAGNOSIS — Z87891 Personal history of nicotine dependence: Secondary | ICD-10-CM | POA: Diagnosis not present

## 2016-10-07 DIAGNOSIS — K589 Irritable bowel syndrome without diarrhea: Secondary | ICD-10-CM | POA: Insufficient documentation

## 2016-10-07 DIAGNOSIS — O26892 Other specified pregnancy related conditions, second trimester: Secondary | ICD-10-CM | POA: Diagnosis not present

## 2016-10-07 DIAGNOSIS — Z79899 Other long term (current) drug therapy: Secondary | ICD-10-CM | POA: Insufficient documentation

## 2016-10-07 DIAGNOSIS — O99352 Diseases of the nervous system complicating pregnancy, second trimester: Secondary | ICD-10-CM | POA: Diagnosis not present

## 2016-10-07 DIAGNOSIS — Z3A23 23 weeks gestation of pregnancy: Secondary | ICD-10-CM | POA: Insufficient documentation

## 2016-10-07 LAB — URINALYSIS, ROUTINE W REFLEX MICROSCOPIC
BILIRUBIN URINE: NEGATIVE
Glucose, UA: NEGATIVE mg/dL
Hgb urine dipstick: NEGATIVE
Ketones, ur: NEGATIVE mg/dL
Nitrite: NEGATIVE
PH: 7 (ref 5.0–8.0)
Protein, ur: NEGATIVE mg/dL
SPECIFIC GRAVITY, URINE: 1.003 — AB (ref 1.005–1.030)

## 2016-10-07 MED ORDER — POLYETHYLENE GLYCOL 3350 17 GM/SCOOP PO POWD
17.0000 g | Freq: Every day | ORAL | 99 refills | Status: DC
Start: 2016-10-07 — End: 2017-01-11

## 2016-10-07 MED ORDER — OMEPRAZOLE 20 MG PO CPDR: 20.0000 mg | DELAYED_RELEASE_CAPSULE | Freq: Every day | ORAL | 4 refills | Status: DC

## 2016-10-07 NOTE — Discharge Instructions (Signed)
B Food Choices for Gastroesophageal Reflux Disease, Adult When you have gastroesophageal reflux disease (GERD), the foods you eat and your eating habits are very important. Choosing the right foods can help ease your discomfort. What guidelines do I need to follow?  Choose fruits, vegetables, whole grains, and low-fat dairy products.  Choose low-fat meat, fish, and poultry.  Limit fats such as oils, salad dressings, butter, nuts, and avocado.  Keep a food diary. This helps you identify foods that cause symptoms.  Avoid foods that cause symptoms. These may be different for everyone.  Eat small meals often instead of 3 large meals a day.  Eat your meals slowly, in a place where you are relaxed.  Limit fried foods.  Cook foods using methods other than frying.  Avoid drinking alcohol.  Avoid drinking large amounts of liquids with your meals.  Avoid bending over or lying down until 2-3 hours after eating. What foods are not recommended? These are some foods and drinks that may make your symptoms worse: Vegetables Tomatoes. Tomato juice. Tomato and spaghetti sauce. Chili peppers. Onion and garlic. Horseradish. Fruits Oranges, grapefruit, and lemon (fruit and juice). Meats High-fat meats, fish, and poultry. This includes hot dogs, ribs, ham, sausage, salami, and bacon. Dairy Whole milk and chocolate milk. Sour cream. Cream. Butter. Ice cream. Cream cheese. Drinks Coffee and tea. Bubbly (carbonated) drinks or energy drinks. Condiments Hot sauce. Barbecue sauce. Sweets/Desserts Chocolate and cocoa. Donuts. Peppermint and spearmint. Fats and Oils High-fat foods. This includes [place] fries and potato chips. Other Vinegar. Strong spices. This includes black pepper, white pepper, red pepper, cayenne, curry powder, cloves, ginger, and chili powder. The items listed above may not be a complete list of foods and drinks to avoid. Contact your dietitian for more information. This  information is not intended to replace advice given to you by your health care provider. Make sure you discuss any questions you have with your health care provider. Document Released: 08/02/2011 Document Revised: 07/09/2015 Document Reviewed: 12/05/2012 Elsevier Interactive Patient Education  2017 [employer].  Constipation, Adult Constipation is when a person:  Poops (has a bowel movement) fewer times in a week than normal.  Has a hard time pooping.  Has poop that is dry, hard, or bigger than normal.  Follow these instructions at home: Eating and drinking   Eat foods that have a lot of fiber, such as: ? Fresh fruits and vegetables. ? Whole grains. ? Beans.  Eat less of foods that are high in fat, low in fiber, or overly processed, such as: ? [place] fries. ? Hamburgers. ? Cookies. ? Candy. ? Soda.  Drink enough fluid to keep your pee (urine) clear or pale yellow. General instructions  Exercise regularly or as told by your doctor.  Go to the restroom when you feel like you need to poop. Do not hold it in.  Take over-the-counter and prescription medicines only as told by your doctor. These include any fiber supplements.  Do pelvic floor retraining exercises, such as: ? Doing deep breathing while relaxing your lower belly (abdomen). ? Relaxing your pelvic floor while pooping.  Watch your condition for any changes.  Keep all follow-up visits as told by your doctor. This is important. Contact a doctor if:  You have pain that gets worse.  You have a fever.  You have not pooped for 4 days.  You throw up (vomit).  You are not hungry.  You lose weight.  You are bleeding from the anus.  You  have thin, pencil-like poop (stool). Get help right away if:  You have a fever, and your symptoms suddenly get worse.  You leak poop or have blood in your poop.  Your belly feels hard or bigger than normal (is bloated).  You have very bad belly pain.  You feel dizzy  or you faint. This information is not intended to replace advice given to you by your health care provider. Make sure you discuss any questions you have with your health care provider. Document Released: 07/20/2007 Document Revised: 08/21/2015 Document Reviewed: 07/22/2015 Elsevier Interactive Patient Education  2017 [employer].

## 2016-10-07 NOTE — MAU Note (Signed)
Pt presents to MAU with complaints of pain in the left side of her abdomen that has happened a couple of times since yesterday, describes it as a shooting pain. Denies any VB or abnormal disharge

## 2016-10-07 NOTE — MAU Provider Note (Signed)
History     CSN: [id number]  Arrival date and time: 10/07/16 1634   None     Chief Complaint  Patient presents with  . Abdominal Pain   HPI 30 yr G2P0010 at [redacted]w[redacted]d, Hx IBS-C, was at work with acute spasmodic LLQ pain intermittent, no bleeding or change in d/c. Had BM on way to hospital ED, and no pain since. Good FM.  Monitor shows normal fhr, no uterine contractions. No bleeding or srom. + GERD sx./   Past Medical History:  Diagnosis Date  . Bilateral carpal tunnel syndrome 11/18/2015  . Depression with anxiety 05/07/2014  . GERD (gastroesophageal reflux disease)   . IBS (irritable bowel syndrome)   . Migraines   . Missed abortion 03/26/2014  . Multiple thyroid nodules   . Painful menstrual periods 11/13/2014  . Raynaud disease     Past Surgical History:  Procedure Laterality Date  . COLONOSCOPY  11/03/11   [place]:colonic mucosa appeared normal  . DG SELECTED HS[facility] ONLY    . WISDOM TOOTH EXTRACTION      Family History  Problem Relation Age of Onset  . Diabetes Father   . Heart disease Father   . Alcohol abuse Father   . Hypertension Father   . Rheum arthritis Mother   . Arthritis Mother        rheumatoid  . Diabetes Paternal Grandfather   . Congestive Heart Failure Paternal Grandfather   . Alzheimer's disease Paternal Grandmother   . Other Maternal Grandmother        obstruction in stomach  . Stroke Maternal Grandfather   . Diabetes Maternal Grandfather   . Diabetes Sister   . Hypertension Sister   . Hypertension Brother   . Colon cancer Neg Hx   . Esophageal cancer Neg Hx   . Rectal cancer Neg Hx   . Stomach cancer Neg Hx     Social History  Substance Use Topics  . Smoking status: Former Smoker    Packs/day: 0.25    Years: 10.00    Types: Cigarettes    Quit date: 04/14/2016  . Smokeless tobacco: Never Used     Comment: 5 cigs per day  . Alcohol use No     Comment: occ wine    Allergies: No Known Allergies  Prescriptions Prior to  Admission  Medication Sig Dispense Refill Last Dose  . aspirin EC 81 MG tablet Take 81 mg by mouth daily.   Taking  . Prenatal Vit-Fe Fumarate-FA (PRENATAL MULTIVITAMIN) TABS tablet Take 1 tablet by mouth daily at 12 noon.   Taking  . sertraline (ZOLOFT) 50 MG tablet TAKE 1 TABLET BY MOUTH ONCE DAILY. 30 tablet 0     Review of Systems  Gastrointestinal: Positive for abdominal pain and constipation. Negative for diarrhea, nausea and vomiting.  Neurological: Negative.    Physical Exam   Blood pressure 121/69, pulse 92, resp. rate 18, height 5\' 2"  (1.575 m), weight 148 lb (67.1 kg), last menstrual period 04/26/2016.  Physical Exam  Constitutional: She is oriented to person, place, and time. She appears well-developed and well-nourished.  HENT:  Head: Normocephalic.  Eyes: Pupils are equal, round, and reactive to light.  Neck: Normal range of motion. Neck supple.  Cardiovascular: Normal rate.   Respiratory: Effort normal.  GI: Soft. Bowel sounds are normal.  Gravid nontender uterus c/w dates, Cervix long, closed, vagina normal d/c  Genitourinary: Vagina normal.  Musculoskeletal: Normal range of motion.  Neurological: She is alert and oriented  to person, place, and time. She has normal reflexes.  Skin: Skin is warm and dry.  Psychiatric: She has a normal mood and affect. Her behavior is normal. Judgment and thought content normal.   Urine dipstick shows negative for all components.  Micro exam: negative for WBC's or RBC's.  MAU Course  Procedures  MDM Exam, review of labs,   Assessment and Plan  Pain due to functional bowel pain,  Hx IBS-C Pregnancy 23 wk normal exam. GERD Plan:  Colace or miralax 3-7x/wk Omeprazole. Angela [name] V 10/07/2016, 6:53 PM

## 2016-10-11 ENCOUNTER — Ambulatory Visit (HOSPITAL_COMMUNITY): Payer: BLUE CROSS/BLUE SHIELD

## 2016-10-12 ENCOUNTER — Ambulatory Visit (HOSPITAL_COMMUNITY)
Admission: RE | Admit: 2016-10-12 | Discharge: 2016-10-12 | Disposition: A | Discharge status: Home / Self Care | Payer: BLUE CROSS/BLUE SHIELD | Source: Ambulatory Visit | Attending: Obstetrics and Gynecology | Admitting: Obstetrics and Gynecology

## 2016-10-12 DIAGNOSIS — Z3480 Encounter for supervision of other normal pregnancy, unspecified trimester: Secondary | ICD-10-CM

## 2016-10-12 DIAGNOSIS — Z3689 Encounter for other specified antenatal screening: Secondary | ICD-10-CM | POA: Diagnosis not present

## 2016-10-12 DIAGNOSIS — Z3A24 24 weeks gestation of pregnancy: Secondary | ICD-10-CM | POA: Diagnosis not present

## 2016-11-04 ENCOUNTER — Other Ambulatory Visit: Payer: Self-pay | Admitting: Family Medicine

## 2016-11-04 NOTE — Telephone Encounter (Signed)
Patient requesting a refill on her prescription for sertraline (Zoloft).   Patient's last visit: 05/20/2016 Please Advise.  Thank you

## 2016-11-04 NOTE — Telephone Encounter (Signed)
Patient's upcoming appt. Is on 11/25/2016.

## 2016-11-09 ENCOUNTER — Ambulatory Visit (INDEPENDENT_AMBULATORY_CARE_PROVIDER_SITE_OTHER): Payer: BLUE CROSS/BLUE SHIELD | Admitting: Obstetrics & Gynecology

## 2016-11-09 VITALS — BP 106/72 | HR 83 | Wt 154.0 lb

## 2016-11-09 DIAGNOSIS — Z3480 Encounter for supervision of other normal pregnancy, unspecified trimester: Secondary | ICD-10-CM | POA: Diagnosis not present

## 2016-11-09 DIAGNOSIS — Z3483 Encounter for supervision of other normal pregnancy, third trimester: Secondary | ICD-10-CM

## 2016-11-09 DIAGNOSIS — Z23 Encounter for immunization: Secondary | ICD-10-CM | POA: Diagnosis not present

## 2016-11-09 MED ORDER — FERROUS SULFATE 325 (65 FE) MG PO TABS: 325.0000 mg | ORAL_TABLET | Freq: Two times a day (BID) | ORAL | 1 refills | Status: DC

## 2016-11-09 NOTE — Addendum Note (Signed)
Addended by: Arne Cleveland on: 11/09/2016 11:08 AM   Modules accepted: Orders

## 2016-11-09 NOTE — Progress Notes (Signed)
   PRENATAL VISIT NOTE  Subjective:  Angela [name] is a 31 y.o. MW G2P0010 [facility])  at [redacted]w[redacted]d being seen today for ongoing prenatal care.  She is currently monitored for the following issues for this low-risk pregnancy and has IBS (irritable bowel syndrome); Raynaud disease; Depression with anxiety; Smoker; Bilateral carpal tunnel syndrome; Supervision of other normal pregnancy, antepartum; Epistaxis; Gastroesophageal reflux disease without esophagitis; and Abdominal pain during pregnancy, second trimester on her problem list.  Patient reports no complaints.  Contractions: Not present. Vag. Bleeding: None.  Movement: Present. Denies leaking of fluid.   The following portions of the patient's history were reviewed and updated as appropriate: allergies, current medications, past family history, past medical history, past social history, past surgical history and problem list. Problem list updated.  Objective:   Vitals:   11/09/16 0851  BP: 106/72  Pulse: 83  Weight: 154 lb (69.9 kg)    Fetal Status:     Movement: Present     General:  Alert, oriented and cooperative. Patient is in no acute distress.  Skin: Skin is warm and dry. No rash noted.   Cardiovascular: Normal heart rate noted  Respiratory: Normal respiratory effort, no problems with respiration noted  Abdomen: Soft, gravid, appropriate for gestational age.  Pain/Pressure: Absent     Pelvic: Cervical exam deferred        Extremities: Normal range of motion.  Edema: Trace  Mental Status:  Normal mood and affect. Normal behavior. Normal judgment and thought content.   Assessment and Plan:  Pregnancy: G2P0010 at [redacted]w[redacted]d  1. Supervision of other normal pregnancy, antepartum  - Glucose Tolerance, 2 Hours w/1 Hour - CBC - HIV antibody - RPR - Tdap vaccine greater than or equal to 7yo IM - Flu Vaccine QUAD 36+ mos IM (Fluarix, Quad PF)  2. Supervision of other normal pregnancy, antepartum  - Glucose Tolerance, 2 Hours w/1  Hour - CBC - HIV antibody - RPR - Tdap vaccine greater than or equal to 7yo IM - Flu Vaccine QUAD 36+ mos IM (Fluarix, Quad PF)  Preterm labor symptoms and general obstetric precautions including but not limited to vaginal bleeding, contractions, leaking of fluid and fetal movement were reviewed in detail with the patient. Please refer to After Visit Summary for other counseling recommendations.  Return in about 4 weeks (around 12/07/2016).   Allie Bossier, MD

## 2016-11-09 NOTE — Progress Notes (Signed)
Pt c/o increased swelling in hands.

## 2016-11-10 ENCOUNTER — Encounter: Payer: Self-pay | Admitting: Family Medicine

## 2016-11-10 LAB — GLUCOSE TOLERANCE, 2 HOURS W/ 1HR
GLUCOSE, 2 HOUR: 115 mg/dL (ref 65–152)
GLUCOSE, FASTING: 76 mg/dL (ref 65–91)
Glucose, 1 hour: 134 mg/dL (ref 65–179)

## 2016-11-10 LAB — RPR: RPR: NONREACTIVE

## 2016-11-10 LAB — CBC
Hematocrit: 32.3 % — ABNORMAL LOW (ref 34.0–46.6)
Hemoglobin: 10.4 g/dL — ABNORMAL LOW (ref 11.1–15.9)
MCH: 26.3 pg — ABNORMAL LOW (ref 26.6–33.0)
MCHC: 32.2 g/dL (ref 31.5–35.7)
MCV: 82 fL (ref 79–97)
PLATELETS: 196 10*3/uL (ref 150–379)
RBC: 3.96 x10E6/uL (ref 3.77–5.28)
RDW: 14.7 % (ref 12.3–15.4)
WBC: 5.1 10*3/uL (ref 3.4–10.8)

## 2016-11-10 LAB — HIV ANTIBODY (ROUTINE TESTING W REFLEX): HIV Screen 4th Generation wRfx: NONREACTIVE

## 2016-11-10 NOTE — Telephone Encounter (Signed)
Appointment cancelled. Dr.Johnson, can you give her more medication?

## 2016-11-17 ENCOUNTER — Encounter: Payer: Self-pay | Admitting: Family Medicine

## 2016-11-21 ENCOUNTER — Telehealth: Payer: Self-pay | Admitting: *Deleted

## 2016-11-21 DIAGNOSIS — R112 Nausea with vomiting, unspecified: Secondary | ICD-10-CM

## 2016-11-21 MED ORDER — ONDANSETRON 4 MG PO TBDP: 4.0000 mg | ORAL_TABLET | Freq: Four times a day (QID) | ORAL | 0 refills | Status: DC | PRN

## 2016-11-21 NOTE — Telephone Encounter (Signed)
Pt called in stating she has nausea and vomiting related to pregnancy. Called in Zofran for pt.

## 2016-11-25 ENCOUNTER — Encounter: Payer: BLUE CROSS/BLUE SHIELD | Admitting: Family Medicine

## 2016-11-25 ENCOUNTER — Encounter (HOSPITAL_COMMUNITY): Payer: Self-pay | Admitting: Emergency Medicine

## 2016-11-25 ENCOUNTER — Inpatient Hospital Stay (HOSPITAL_COMMUNITY)
Admission: AD | Admit: 2016-11-25 | Discharge: 2016-11-25 | Disposition: A | Discharge status: Home / Self Care | Payer: BLUE CROSS/BLUE SHIELD | Source: Ambulatory Visit | Attending: Obstetrics and Gynecology | Admitting: Obstetrics and Gynecology

## 2016-11-25 DIAGNOSIS — K589 Irritable bowel syndrome without diarrhea: Secondary | ICD-10-CM | POA: Insufficient documentation

## 2016-11-25 DIAGNOSIS — O99343 Other mental disorders complicating pregnancy, third trimester: Secondary | ICD-10-CM | POA: Insufficient documentation

## 2016-11-25 DIAGNOSIS — Z79899 Other long term (current) drug therapy: Secondary | ICD-10-CM | POA: Diagnosis not present

## 2016-11-25 DIAGNOSIS — N898 Other specified noninflammatory disorders of vagina: Secondary | ICD-10-CM | POA: Insufficient documentation

## 2016-11-25 DIAGNOSIS — Z0371 Encounter for suspected problem with amniotic cavity and membrane ruled out: Secondary | ICD-10-CM

## 2016-11-25 DIAGNOSIS — R1030 Lower abdominal pain, unspecified: Secondary | ICD-10-CM | POA: Insufficient documentation

## 2016-11-25 DIAGNOSIS — O99613 Diseases of the digestive system complicating pregnancy, third trimester: Secondary | ICD-10-CM | POA: Diagnosis not present

## 2016-11-25 DIAGNOSIS — Z8261 Family history of arthritis: Secondary | ICD-10-CM | POA: Diagnosis not present

## 2016-11-25 DIAGNOSIS — Z87891 Personal history of nicotine dependence: Secondary | ICD-10-CM | POA: Diagnosis not present

## 2016-11-25 DIAGNOSIS — Z833 Family history of diabetes mellitus: Secondary | ICD-10-CM | POA: Insufficient documentation

## 2016-11-25 DIAGNOSIS — Z3A30 30 weeks gestation of pregnancy: Secondary | ICD-10-CM | POA: Diagnosis not present

## 2016-11-25 DIAGNOSIS — R109 Unspecified abdominal pain: Secondary | ICD-10-CM | POA: Diagnosis not present

## 2016-11-25 DIAGNOSIS — Z7982 Long term (current) use of aspirin: Secondary | ICD-10-CM | POA: Diagnosis not present

## 2016-11-25 DIAGNOSIS — Z811 Family history of alcohol abuse and dependence: Secondary | ICD-10-CM | POA: Insufficient documentation

## 2016-11-25 DIAGNOSIS — Z8249 Family history of ischemic heart disease and other diseases of the circulatory system: Secondary | ICD-10-CM | POA: Insufficient documentation

## 2016-11-25 DIAGNOSIS — F418 Other specified anxiety disorders: Secondary | ICD-10-CM | POA: Diagnosis not present

## 2016-11-25 DIAGNOSIS — O9989 Other specified diseases and conditions complicating pregnancy, childbirth and the puerperium: Secondary | ICD-10-CM | POA: Diagnosis not present

## 2016-11-25 DIAGNOSIS — K5900 Constipation, unspecified: Secondary | ICD-10-CM | POA: Diagnosis not present

## 2016-11-25 DIAGNOSIS — O26893 Other specified pregnancy related conditions, third trimester: Secondary | ICD-10-CM | POA: Insufficient documentation

## 2016-11-25 LAB — URINALYSIS, ROUTINE W REFLEX MICROSCOPIC
Bilirubin Urine: NEGATIVE
GLUCOSE, UA: NEGATIVE mg/dL
HGB URINE DIPSTICK: NEGATIVE
KETONES UR: NEGATIVE mg/dL
NITRITE: NEGATIVE
PROTEIN: NEGATIVE mg/dL
Specific Gravity, Urine: 1.008 (ref 1.005–1.030)
pH: 7 (ref 5.0–8.0)

## 2016-11-25 LAB — POCT FERN TEST: POCT Fern Test: NEGATIVE

## 2016-11-25 NOTE — MAU Provider Note (Signed)
History     CSN: [id number]  Arrival date and time: 11/25/16 1040   First Provider Initiated Contact with Patient 11/25/16 1131      Chief Complaint  Patient presents with  . Abdominal Pain  . Rupture of Membranes   HPI Angela [name] is a 31 y.o. G2P0010 at [redacted]w[redacted]d who presents with vaginal discharge & abdominal cramping. States this morning she went to the bathroom & felt a gush of watery fluid come out in the toilet. Leaking has not continued.  Also reports lower abdominal cramping intermittently since last week. States pain is sporadic. Occurred once last night & a few times this morning. Describes as cramping pain that she rates 3/10. Has felt once in the last hour. Has been having issues with constipation. Last BM was this morning. Has been taking miralax for the last 2 weeks irregularly. Last dose was this morning.  Denies n/v/d, vaginal bleeding, dysuria, or recent intercourse. Positive fetal movement.   OB History    Gravida Para Term Preterm AB Living   2 0 0 0 1 0   SAB TAB Ectopic Multiple Live Births   1 0 0 0 0      Past Medical History:  Diagnosis Date  . Bilateral carpal tunnel syndrome 11/18/2015  . Depression with anxiety 05/07/2014  . GERD (gastroesophageal reflux disease)   . IBS (irritable bowel syndrome)   . Migraines   . Missed abortion 03/26/2014  . Multiple thyroid nodules   . Painful menstrual periods 11/13/2014  . Raynaud disease     Past Surgical History:  Procedure Laterality Date  . COLONOSCOPY  11/03/11   [place]:colonic mucosa appeared normal  . DG SELECTED HSG [facility] ONLY    . WISDOM TOOTH EXTRACTION      Family History  Problem Relation Age of Onset  . Diabetes Father   . Heart disease Father   . Alcohol abuse Father   . Hypertension Father   . Rheum arthritis Mother   . Arthritis Mother        rheumatoid  . Diabetes Paternal Grandfather   . Congestive Heart Failure Paternal Grandfather   . Alzheimer's disease Paternal  Grandmother   . Other Maternal Grandmother        obstruction in stomach  . Stroke Maternal Grandfather   . Diabetes Maternal Grandfather   . Diabetes Sister   . Hypertension Sister   . Hypertension Brother   . Colon cancer Neg Hx   . Esophageal cancer Neg Hx   . Rectal cancer Neg Hx   . Stomach cancer Neg Hx     Social History  Substance Use Topics  . Smoking status: Former Smoker    Packs/day: 0.25    Years: 10.00    Types: Cigarettes    Quit date: 04/14/2016  . Smokeless tobacco: Never Used     Comment: 5 cigs per day  . Alcohol use No     Comment: occ wine    Allergies: No Known Allergies  Prescriptions Prior to Admission  Medication Sig Dispense Refill Last Dose  . aspirin EC 81 MG tablet Take 81 mg by mouth daily.   Taking  . ferrous sulfate (FERROUSUL) 325 (65 FE) MG tablet Take 1 tablet (325 mg total) by mouth 2 (two) times daily. 60 tablet 1   . omeprazole (PRILOSEC) 20 MG capsule Take 1 capsule (20 mg total) by mouth daily. 30 capsule 4 Taking  . ondansetron (ZOFRAN ODT) 4 MG disintegrating tablet Take  1 tablet (4 mg total) by mouth every 6 (six) hours as needed for nausea. 20 tablet 0   . polyethylene glycol powder (MIRALAX) powder Take 17 g by mouth daily. To prevent constipation 255 g prn Taking  . Prenatal Vit-Fe Fumarate-FA (PRENATAL MULTIVITAMIN) TABS tablet Take 1 tablet by mouth daily at 12 noon.   Taking  . sertraline (ZOLOFT) 50 MG tablet TAKE 1 TABLET BY MOUTH ONCE DAILY. 30 tablet 0 Taking    Review of Systems  Constitutional: Negative.   Gastrointestinal: Positive for abdominal pain and constipation. Negative for blood in stool, diarrhea, nausea and vomiting.  Genitourinary: Positive for vaginal discharge. Negative for dysuria and vaginal bleeding.  Musculoskeletal: Negative for back pain.   Physical Exam   Blood pressure 118/62, pulse 80, temperature 98.5 F (36.9 C), temperature source Oral, resp. rate 18, height  (1.575 m), weight 157 lb  12 oz (71.6 kg), last menstrual period 04/26/2016.  Physical Exam  Nursing note and vitals reviewed. Constitutional: She is oriented to person, place, and time. She appears well-developed and well-nourished. No distress.  HENT:  Head: Normocephalic and atraumatic.  Eyes: Conjunctivae are normal. Right eye exhibits no discharge. Left eye exhibits no discharge. No scleral icterus.  Neck: Normal range of motion.  Cardiovascular: Normal rate, regular rhythm and normal heart sounds.   No murmur heard. Respiratory: Effort normal and breath sounds normal. No respiratory distress. She has no wheezes.  GI: Soft. Bowel sounds are normal. There is no tenderness.  Genitourinary: Vaginal discharge (small amount of physiologic discharge. No pooling of fluid. ) found.  Genitourinary Comments: Dilation: Closed/thick/-3   Neurological: She is alert and oriented to person, place, and time.  Skin: Skin is warm and dry. She is not diaphoretic.  Psychiatric: She has a normal mood and affect. Her behavior is normal. Judgment and thought content normal.   Fetal Tracing:  Baseline: 135 Variability: moderate Accelerations: 15x15 Decelerations: none  Toco: x3 MAU Course  Procedures Results for orders placed or performed during the hospital encounter of 11/25/16 (from the past 24 hour(s))  Urinalysis, Routine w reflex microscopic     Status: Abnormal   Collection Time: 11/25/16 10:45 AM  Result Value Ref Range   Color, Urine YELLOW YELLOW   APPearance CLEAR CLEAR   Specific Gravity, Urine 1.008 1.005 - 1.030   pH 7.0 5.0 - 8.0   Glucose, UA NEGATIVE NEGATIVE mg/dL   Hgb urine dipstick NEGATIVE NEGATIVE   Bilirubin Urine NEGATIVE NEGATIVE   Ketones, ur NEGATIVE NEGATIVE mg/dL   Protein, ur NEGATIVE NEGATIVE mg/dL   Nitrite NEGATIVE NEGATIVE   Leukocytes, UA SMALL (A) NEGATIVE   RBC / HPF 0-5 0 - 5 RBC/hpf   WBC, UA 0-5 0 - 5 WBC/hpf   Bacteria, UA FEW (A) NONE SEEN   Squamous Epithelial / LPF 0-5  (A) NONE SEEN   Mucus PRESENT   POCT fern test     Status: None   Collection Time: 11/25/16 11:50 AM  Result Value Ref Range   POCT Fern Test Negative = intact amniotic membranes     MDM Reactive NST Ctx x 3 on monitor, no regular ctx on toco or palpated. Cervix closed/thick/high. On exam, no pooling of fluid & fern negative  Assessment and Plan  A: 1. Encounter for suspected PROM, with rupture of membranes not found   2. [redacted] weeks gestation of pregnancy   3. Constipation during pregnancy in third trimester    P: Discharge home Discussed  tx of constipation including stool softeners, increase water, & fiber Discussed reasons to return to MAU Keep f/u with OB  Judeth Horn 11/25/2016, 11:32 AM

## 2016-11-25 NOTE — MAU Note (Signed)
Started leaking around 2 hrs ago, clear fluid, no bleeding. Is cramping

## 2016-11-25 NOTE — MAU Note (Signed)
Urine sent to lab 

## 2016-11-25 NOTE — Discharge Instructions (Signed)
Constipation, Adult Constipation is when a person has fewer bowel movements in a week than normal, has difficulty having a bowel movement, or has stools that are dry, hard, or larger than normal. Constipation may be caused by an underlying condition. It may become worse with age if a person takes certain medicines and does not take in enough fluids. Follow these instructions at home: Eating and drinking   Eat foods that have a lot of fiber, such as fresh fruits and vegetables, whole grains, and beans.  Limit foods that are high in fat, low in fiber, or overly processed, such as french fries, hamburgers, cookies, candies, and soda.  Drink enough fluid to keep your urine clear or pale yellow. General instructions  Exercise regularly or as told by your health care provider.  Go to the restroom when you have the urge to go. Do not hold it in.  Take over-the-counter and prescription medicines only as told by your health care provider. These include any fiber supplements.  Practice pelvic floor retraining exercises, such as deep breathing while relaxing the lower abdomen and pelvic floor relaxation during bowel movements.  Watch your condition for any changes.  Keep all follow-up visits as told by your health care provider. This is important. Contact a health care provider if:  You have pain that gets worse.  You have a fever.  You do not have a bowel movement after 4 days.  You vomit.  You are not hungry.  You lose weight.  You are bleeding from the anus.  You have thin, pencil-like stools. Get help right away if:  You have a fever and your symptoms suddenly get worse.  You leak stool or have blood in your stool.  Your abdomen is bloated.  You have severe pain in your abdomen.  You feel dizzy or you faint. This information is not intended to replace advice given to you by your health care provider. Make sure you discuss any questions you have with your health care  provider. Document Released: 10/30/2003 Document Revised: 08/21/2015 Document Reviewed: 07/22/2015 Elsevier Interactive Patient Education  2017 [employer]. Preterm Labor and Birth Information The normal length of a pregnancy is 39-41 weeks. Preterm labor is when labor starts before 37 completed weeks of pregnancy. What are the risk factors for preterm labor? Preterm labor is more likely to occur in women who:  Have certain infections during pregnancy such as a bladder infection, sexually transmitted infection, or infection inside the uterus (chorioamnionitis).  Have a shorter-than-normal cervix.  Have gone into preterm labor before.  Have had surgery on their cervix.  Are younger than age 43 or older than age 6.  Are African American.  Are pregnant with twins or multiple babies (multiple gestation).  Take street drugs or smoke while pregnant.  Do not gain enough weight while pregnant.  Became pregnant shortly after having been pregnant.  What are the symptoms of preterm labor? Symptoms of preterm labor include:  Cramps similar to those that can happen during a menstrual period. The cramps may happen with diarrhea.  Pain in the abdomen or lower back.  Regular uterine contractions that may feel like tightening of the abdomen.  A feeling of increased pressure in the pelvis.  Increased watery or bloody mucus discharge from the vagina.  Water breaking (ruptured amniotic sac).  Why is it important to recognize signs of preterm labor? It is important to recognize signs of preterm labor because babies who are born prematurely may not be  prematurely may not be fully developed. This can put them at an increased risk for: °· Long-term (chronic) heart and lung problems. °· Difficulty immediately after birth with regulating body systems, including blood sugar, body temperature, heart rate, and breathing rate. °· Bleeding in the brain. °· Cerebral palsy. °· Learning  difficulties. °· Death. ° °These risks are highest for babies who are born before 34 weeks of pregnancy. °How is preterm labor treated? °Treatment depends on the length of your pregnancy, your condition, and the health of your baby. It may involve: °· Having a stitch (suture) placed in your cervix to prevent your cervix from opening too early (cerclage). °· Taking or being given medicines, such as: °? Hormone medicines. These may be given early in pregnancy to help support the pregnancy. °? Medicine to stop contractions. °? Medicines to help mature the baby’s lungs. These may be prescribed if the risk of delivery is high. °? Medicines to prevent your baby from developing cerebral palsy. ° °If the labor happens before 34 weeks of pregnancy, you may need to stay in the hospital. °What should I do if I think I am in preterm labor? °If you think that you are going into preterm labor, call your health care provider right away. °How can I prevent preterm labor in future pregnancies? °To increase your chance of having a full-term pregnancy: °· Do not use any tobacco products, such as cigarettes, chewing tobacco, and e-cigarettes. If you need help quitting, ask your health care provider. °· Do not use street drugs or medicines that have not been prescribed to you during your pregnancy. °· Talk with your health care provider before taking any herbal supplements, even if you have been taking them regularly. °· Make sure you gain a healthy amount of weight during your pregnancy. °· Watch for infection. If you think that you might have an infection, get it checked right away. °· Make sure to tell your health care provider if you have gone into preterm labor before. ° °This information is not intended to replace advice given to you by your health care provider. Make sure you discuss any questions you have with your health care provider. °Document Released: 04/23/2003 Document Revised: 07/14/2015 Document Reviewed:  06/24/2015 °Elsevier Interactive Patient Education © 2018 Elsevier Inc. ° °

## 2016-11-30 ENCOUNTER — Encounter (INDEPENDENT_AMBULATORY_CARE_PROVIDER_SITE_OTHER): Payer: Self-pay | Admitting: *Deleted

## 2016-11-30 DIAGNOSIS — Z029 Encounter for administrative examinations, unspecified: Secondary | ICD-10-CM

## 2016-12-02 ENCOUNTER — Ambulatory Visit (INDEPENDENT_AMBULATORY_CARE_PROVIDER_SITE_OTHER): Payer: BLUE CROSS/BLUE SHIELD | Admitting: *Deleted

## 2016-12-02 DIAGNOSIS — Z3480 Encounter for supervision of other normal pregnancy, unspecified trimester: Secondary | ICD-10-CM

## 2016-12-02 DIAGNOSIS — Z3687 Encounter for antenatal screening for uncertain dates: Secondary | ICD-10-CM | POA: Diagnosis not present

## 2016-12-02 NOTE — Progress Notes (Signed)
Pt came into office to check AFI. She was seen in MAU for r/o rupture and states she is still having contractions. AFI = 20cm and presentation = vertex. After talking to pt and go over what braxton hicks may feel like and what round ligament pain may feel like, it was agreed that she may be having both. Advised pt to wear belly band and stay well hydrated. If contraction become more intense, ROM, or decreased fetal movement please report to MAU for eval, ot expressed understanding.

## 2016-12-07 ENCOUNTER — Ambulatory Visit (INDEPENDENT_AMBULATORY_CARE_PROVIDER_SITE_OTHER): Payer: BLUE CROSS/BLUE SHIELD | Admitting: Family Medicine

## 2016-12-07 VITALS — BP 125/75 | HR 101 | Wt 160.4 lb

## 2016-12-07 DIAGNOSIS — Z3480 Encounter for supervision of other normal pregnancy, unspecified trimester: Secondary | ICD-10-CM

## 2016-12-07 DIAGNOSIS — Z3483 Encounter for supervision of other normal pregnancy, third trimester: Secondary | ICD-10-CM

## 2016-12-07 NOTE — Progress Notes (Signed)
   PRENATAL VISIT NOTE  Subjective:  Angela [name] is a 31 y.o. G2P0010 at 8960w1d being seen today for ongoing prenatal care.  She is currently monitored for the following issues for this low-risk pregnancy and has IBS (irritable bowel syndrome); Raynaud disease; Depression with anxiety; Smoker; Bilateral carpal tunnel syndrome; Supervision of other normal pregnancy, antepartum; Epistaxis; Gastroesophageal reflux disease without esophagitis; and Abdominal pain during pregnancy, second trimester on her problem list.  Patient reports LE and hand swelling and carpal tunnel. BH contractions.  Contractions: Irregular. Vag. Bleeding: None.  Movement: Present. Denies leaking of fluid.   The following portions of the patient's history were reviewed and updated as appropriate: allergies, current medications, past family history, past medical history, past social history, past surgical history and problem list. Problem list updated.  Objective:   Vitals:   12/07/16 1011  BP: 125/75  Pulse: (!) 101  Weight: 160 lb 6.4 oz (72.8 kg)    Fetal Status: Fetal Heart Rate (bpm): 145 Fundal Height: 31 cm Movement: Present     General:  Alert, oriented and cooperative. Patient is in no acute distress.  Skin: Skin is warm and dry. No rash noted.   Cardiovascular: Normal heart rate noted  Respiratory: Normal respiratory effort, no problems with respiration noted  Abdomen: Soft, gravid, appropriate for gestational age.  Pain/Pressure: Present     Pelvic: Cervical exam deferred        Extremities: Normal range of motion.  Edema: Trace  Mental Status:  Normal mood and affect. Normal behavior. Normal judgment and thought content.   Assessment and Plan:  Pregnancy: G2P0010 at 3760w1d  1. Supervision of other normal pregnancy, antepartum Reviewed warning signs for labor Carpal tunnel-->biltaeral wrist splinting at night. Elevate feet Weekly BP checks through [employer].  Preterm labor symptoms and general  obstetric precautions including but not limited to vaginal bleeding, contractions, leaking of fluid and fetal movement were reviewed in detail with the patient. Please refer to After Visit Summary for other counseling recommendations.  Return in 4 weeks (on 01/04/2017).   Reva Boresanya S Kmari Halter, MD

## 2016-12-07 NOTE — Patient Instructions (Signed)
Breastfeeding Deciding to breastfeed is one of the best choices you can make for you and your baby. A change in hormones during pregnancy causes your breast tissue to grow and increases the number and size of your milk ducts. These hormones also allow proteins, sugars, and fats from your blood supply to make breast milk in your milk-producing glands. Hormones prevent breast milk from being released before your baby is born as well as prompt milk flow after birth. Once breastfeeding has begun, thoughts of your baby, as well as his or her sucking or crying, can stimulate the release of milk from your milk-producing glands. Benefits of breastfeeding For Your Baby  Your first milk (colostrum) helps your baby's digestive system function better.  There are antibodies in your milk that help your baby fight off infections.  Your baby has a lower incidence of asthma, allergies, and sudden infant death syndrome.  The nutrients in breast milk are better for your baby than infant formulas and are designed uniquely for your baby's needs.  Breast milk improves your baby's brain development.  Your baby is less likely to develop other conditions, such as childhood obesity, asthma, or type 2 diabetes mellitus.  For You  Breastfeeding helps to create a very special bond between you and your baby.  Breastfeeding is convenient. Breast milk is always available at the correct temperature and costs nothing.  Breastfeeding helps to burn calories and helps you lose the weight gained during pregnancy.  Breastfeeding makes your uterus contract to its prepregnancy size faster and slows bleeding (lochia) after you give birth.  Breastfeeding helps to lower your risk of developing type 2 diabetes mellitus, osteoporosis, and breast or ovarian cancer later in life.  Signs that your baby is hungry Early Signs of Hunger  Increased alertness or activity.  Stretching.  Movement of the head from side to  side.  Movement of the head and opening of the mouth when the corner of the mouth or cheek is stroked (rooting).  Increased sucking sounds, smacking lips, cooing, sighing, or squeaking.  Hand-to-mouth movements.  Increased sucking of fingers or hands.  Late Signs of Hunger  Fussing.  Intermittent crying.  Extreme Signs of Hunger Signs of extreme hunger will require calming and consoling before your baby will be able to breastfeed successfully. Do not wait for the following signs of extreme hunger to occur before you initiate breastfeeding:  Restlessness.  A loud, strong cry.  Screaming.  Breastfeeding basics Breastfeeding Initiation  Find a comfortable place to sit or lie down, with your neck and back well supported.  Place a pillow or rolled up blanket under your baby to bring him or her to the level of your breast (if you are seated). Nursing pillows are specially designed to help support your arms and your baby while you breastfeed.  Make sure that your baby's abdomen is facing your abdomen.  Gently massage your breast. With your fingertips, massage from your chest wall toward your nipple in a circular motion. This encourages milk flow. You may need to continue this action during the feeding if your milk flows slowly.  Support your breast with 4 fingers underneath and your thumb above your nipple. Make sure your fingers are well away from your nipple and your baby's mouth.  Stroke your baby's lips gently with your finger or nipple.  When your baby's mouth is open wide enough, quickly bring your baby to your breast, placing your entire nipple and as much of the colored area   around your nipple (areola) as possible into your baby's mouth. ? More areola should be visible above your baby's upper lip than below the lower lip. ? Your baby's tongue should be between his or her lower gum and your breast.  Ensure that your baby's mouth is correctly positioned around your nipple  (latched). Your baby's lips should create a seal on your breast and be turned out (everted).  It is common for your baby to suck about 2-3 minutes in order to start the flow of breast milk.  Latching Teaching your baby how to latch on to your breast properly is very important. An improper latch can cause nipple pain and decreased milk supply for you and poor weight gain in your baby. Also, if your baby is not latched onto your nipple properly, he or she may swallow some air during feeding. This can make your baby fussy. Burping your baby when you switch breasts during the feeding can help to get rid of the air. However, teaching your baby to latch on properly is still the best way to prevent fussiness from swallowing air while breastfeeding. Signs that your baby has successfully latched on to your nipple:  Silent tugging or silent sucking, without causing you pain.  Swallowing heard between every 3-4 sucks.  Muscle movement above and in front of his or her ears while sucking.  Signs that your baby has not successfully latched on to nipple:  Sucking sounds or smacking sounds from your baby while breastfeeding.  Nipple pain.  If you think your baby has not latched on correctly, slip your finger into the corner of your baby's mouth to break the suction and place it between your baby's gums. Attempt breastfeeding initiation again. Signs of Successful Breastfeeding Signs from your baby:  A gradual decrease in the number of sucks or complete cessation of sucking.  Falling asleep.  Relaxation of his or her body.  Retention of a small amount of milk in his or her mouth.  Letting go of your breast by himself or herself.  Signs from you:  Breasts that have increased in firmness, weight, and size 1-3 hours after feeding.  Breasts that are softer immediately after breastfeeding.  Increased milk volume, as well as a change in milk consistency and color by the fifth day of  breastfeeding.  Nipples that are not sore, cracked, or bleeding.  Signs That Your Baby is Getting Enough Milk  Wetting at least 1-2 diapers during the first 24 hours after birth.  Wetting at least 5-6 diapers every 24 hours for the first week after birth. The urine should be clear or pale yellow by 5 days after birth.  Wetting 6-8 diapers every 24 hours as your baby continues to grow and develop.  At least 3 stools in a 24-hour period by age 5 days. The stool should be soft and yellow.  At least 3 stools in a 24-hour period by age 7 days. The stool should be seedy and yellow.  No loss of weight greater than 10% of birth weight during the first 3 days of age.  Average weight gain of 4-7 ounces (113-198 g) per week after age 4 days.  Consistent daily weight gain by age 5 days, without weight loss after the age of 2 weeks.  After a feeding, your baby may spit up a small amount. This is common. Breastfeeding frequency and duration Frequent feeding will help you make more milk and can prevent sore nipples and breast engorgement. Breastfeed when   you feel the need to reduce the fullness of your breasts or when your baby shows signs of hunger. This is called "breastfeeding on demand." Avoid introducing a pacifier to your baby while you are working to establish breastfeeding (the first 4-6 weeks after your baby is born). After this time you may choose to use a pacifier. Research has shown that pacifier use during the first year of a baby's life decreases the risk of sudden infant death syndrome (SIDS). Allow your baby to feed on each breast as long as he or she wants. Breastfeed until your baby is finished feeding. When your baby unlatches or falls asleep while feeding from the first breast, offer the second breast. Because newborns are often sleepy in the first few weeks of life, you may need to awaken your baby to get him or her to feed. Breastfeeding times will vary from baby to baby. However,  the following rules can serve as a guide to help you ensure that your baby is properly fed:  Newborns (babies 4 weeks of age or younger) may breastfeed every 1-3 hours.  Newborns should not go longer than 3 hours during the day or 5 hours during the night without breastfeeding.  You should breastfeed your baby a minimum of 8 times in a 24-hour period until you begin to introduce solid foods to your baby at around 6 months of age.  Breast milk pumping Pumping and storing breast milk allows you to ensure that your baby is exclusively fed your breast milk, even at times when you are unable to breastfeed. This is especially important if you are going back to work while you are still breastfeeding or when you are not able to be present during feedings. Your lactation consultant can give you guidelines on how long it is safe to store breast milk. A breast pump is a machine that allows you to pump milk from your breast into a sterile bottle. The pumped breast milk can then be stored in a refrigerator or freezer. Some breast pumps are operated by hand, while others use electricity. Ask your lactation consultant which type will work best for you. Breast pumps can be purchased, but some hospitals and breastfeeding support groups lease breast pumps on a monthly basis. A lactation consultant can teach you how to hand express breast milk, if you prefer not to use a pump. Caring for your breasts while you breastfeed Nipples can become dry, cracked, and sore while breastfeeding. The following recommendations can help keep your breasts moisturized and healthy:  Avoid using soap on your nipples.  Wear a supportive bra. Although not required, special nursing bras and tank tops are designed to allow access to your breasts for breastfeeding without taking off your entire bra or top. Avoid wearing underwire-style bras or extremely tight bras.  Air dry your nipples for 3-4minutes after each feeding.  Use only cotton  bra pads to absorb leaked breast milk. Leaking of breast milk between feedings is normal.  Use lanolin on your nipples after breastfeeding. Lanolin helps to maintain your skin's normal moisture barrier. If you use pure lanolin, you do not need to wash it off before feeding your baby again. Pure lanolin is not toxic to your baby. You may also hand express a few drops of breast milk and gently massage that milk into your nipples and allow the milk to air dry.  In the first few weeks after giving birth, some women experience extremely full breasts (engorgement). Engorgement can make your   breasts feel heavy, warm, and tender to the touch. Engorgement peaks within 3-5 days after you give birth. The following recommendations can help ease engorgement:  Completely empty your breasts while breastfeeding or pumping. You may want to start by applying warm, moist heat (in the shower or with warm water-soaked hand towels) just before feeding or pumping. This increases circulation and helps the milk flow. If your baby does not completely empty your breasts while breastfeeding, pump any extra milk after he or she is finished.  Wear a snug bra (nursing or regular) or tank top for 1-2 days to signal your body to slightly decrease milk production.  Apply ice packs to your breasts, unless this is too uncomfortable for you.  Make sure that your baby is latched on and positioned properly while breastfeeding.  If engorgement persists after 48 hours of following these recommendations, contact your health care provider or a lactation consultant. Overall health care recommendations while breastfeeding  Eat healthy foods. Alternate between meals and snacks, eating 3 of each per day. Because what you eat affects your breast milk, some of the foods may make your baby more irritable than usual. Avoid eating these foods if you are sure that they are negatively affecting your baby.  Drink milk, fruit juice, and water to  satisfy your thirst (about 10 glasses a day).  Rest often, relax, and continue to take your prenatal vitamins to prevent fatigue, stress, and anemia.  Continue breast self-awareness checks.  Avoid chewing and smoking tobacco. Chemicals from cigarettes that pass into breast milk and exposure to secondhand smoke may harm your baby.  Avoid alcohol and drug use, including marijuana. Some medicines that may be harmful to your baby can pass through breast milk. It is important to ask your health care provider before taking any medicine, including all over-the-counter and prescription medicine as well as vitamin and herbal supplements. It is possible to become pregnant while breastfeeding. If birth control is desired, ask your health care provider about options that will be safe for your baby. Contact a health care provider if:  You feel like you want to stop breastfeeding or have become frustrated with breastfeeding.  You have painful breasts or nipples.  Your nipples are cracked or bleeding.  Your breasts are red, tender, or warm.  You have a swollen area on either breast.  You have a fever or chills.  You have nausea or vomiting.  You have drainage other than breast milk from your nipples.  Your breasts do not become full before feedings by the fifth day after you give birth.  You feel sad and depressed.  Your baby is too sleepy to eat well.  Your baby is having trouble sleeping.  Your baby is wetting less than 3 diapers in a 24-hour period.  Your baby has less than 3 stools in a 24-hour period.  Your baby's skin or the white part of his or her eyes becomes yellow.  Your baby is not gaining weight by 5 days of age. Get help right away if:  Your baby is overly tired (lethargic) and does not want to wake up and feed.  Your baby develops an unexplained fever. This information is not intended to replace advice given to you by your health care provider. Make sure you discuss  any questions you have with your health care provider. Document Released: 01/31/2005 Document Revised: 07/15/2015 Document Reviewed: 07/25/2012 Elsevier Interactive Patient Education  2017 [employer]  

## 2016-12-08 ENCOUNTER — Other Ambulatory Visit: Payer: Self-pay | Admitting: Family Medicine

## 2016-12-15 ENCOUNTER — Encounter: Payer: Self-pay | Admitting: *Deleted

## 2017-01-02 ENCOUNTER — Telehealth: Payer: Self-pay | Admitting: *Deleted

## 2017-01-02 NOTE — Telephone Encounter (Signed)
Pt called in stating she has vaginal "stinging/stabbing pain". Pt denies vaginal bleeding, break in water, and contractions. Advised pt to report to [facility] if any signs of labor. Pt expressed understanding.

## 2017-01-04 ENCOUNTER — Encounter: Payer: Self-pay | Admitting: Family Medicine

## 2017-01-04 ENCOUNTER — Ambulatory Visit (INDEPENDENT_AMBULATORY_CARE_PROVIDER_SITE_OTHER): Payer: BLUE CROSS/BLUE SHIELD | Admitting: Family Medicine

## 2017-01-04 VITALS — BP 126/70 | HR 87 | Wt 166.0 lb

## 2017-01-04 DIAGNOSIS — Z3480 Encounter for supervision of other normal pregnancy, unspecified trimester: Secondary | ICD-10-CM

## 2017-01-04 DIAGNOSIS — Z113 Encounter for screening for infections with a predominantly sexual mode of transmission: Secondary | ICD-10-CM

## 2017-01-04 DIAGNOSIS — Z3483 Encounter for supervision of other normal pregnancy, third trimester: Secondary | ICD-10-CM

## 2017-01-04 LAB — OB RESULTS CONSOLE GC/CHLAMYDIA: GC PROBE AMP, GENITAL: NEGATIVE

## 2017-01-04 NOTE — Patient Instructions (Signed)
Breastfeeding Deciding to breastfeed is one of the best choices you can make for you and your baby. A change in hormones during pregnancy causes your breast tissue to grow and increases the number and size of your milk ducts. These hormones also allow proteins, sugars, and fats from your blood supply to make breast milk in your milk-producing glands. Hormones prevent breast milk from being released before your baby is born as well as prompt milk flow after birth. Once breastfeeding has begun, thoughts of your baby, as well as his or her sucking or crying, can stimulate the release of milk from your milk-producing glands. Benefits of breastfeeding For Your Baby  Your first milk (colostrum) helps your baby's digestive system function better.  There are antibodies in your milk that help your baby fight off infections.  Your baby has a lower incidence of asthma, allergies, and sudden infant death syndrome.  The nutrients in breast milk are better for your baby than infant formulas and are designed uniquely for your baby's needs.  Breast milk improves your baby's brain development.  Your baby is less likely to develop other conditions, such as childhood obesity, asthma, or type 2 diabetes mellitus.  For You  Breastfeeding helps to create a very special bond between you and your baby.  Breastfeeding is convenient. Breast milk is always available at the correct temperature and costs nothing.  Breastfeeding helps to burn calories and helps you lose the weight gained during pregnancy.  Breastfeeding makes your uterus contract to its prepregnancy size faster and slows bleeding (lochia) after you give birth.  Breastfeeding helps to lower your risk of developing type 2 diabetes mellitus, osteoporosis, and breast or ovarian cancer later in life.  Signs that your baby is hungry Early Signs of Hunger  Increased alertness or activity.  Stretching.  Movement of the head from side to  side.  Movement of the head and opening of the mouth when the corner of the mouth or cheek is stroked (rooting).  Increased sucking sounds, smacking lips, cooing, sighing, or squeaking.  Hand-to-mouth movements.  Increased sucking of fingers or hands.  Late Signs of Hunger  Fussing.  Intermittent crying.  Extreme Signs of Hunger Signs of extreme hunger will require calming and consoling before your baby will be able to breastfeed successfully. Do not wait for the following signs of extreme hunger to occur before you initiate breastfeeding:  Restlessness.  A loud, strong cry.  Screaming.  Breastfeeding basics Breastfeeding Initiation  Find a comfortable place to sit or lie down, with your neck and back well supported.  Place a pillow or rolled up blanket under your baby to bring him or her to the level of your breast (if you are seated). Nursing pillows are specially designed to help support your arms and your baby while you breastfeed.  Make sure that your baby's abdomen is facing your abdomen.  Gently massage your breast. With your fingertips, massage from your chest wall toward your nipple in a circular motion. This encourages milk flow. You may need to continue this action during the feeding if your milk flows slowly.  Support your breast with 4 fingers underneath and your thumb above your nipple. Make sure your fingers are well away from your nipple and your baby's mouth.  Stroke your baby's lips gently with your finger or nipple.  When your baby's mouth is open wide enough, quickly bring your baby to your breast, placing your entire nipple and as much of the colored area   around your nipple (areola) as possible into your baby's mouth. ? More areola should be visible above your baby's upper lip than below the lower lip. ? Your baby's tongue should be between his or her lower gum and your breast.  Ensure that your baby's mouth is correctly positioned around your nipple  (latched). Your baby's lips should create a seal on your breast and be turned out (everted).  It is common for your baby to suck about 2-3 minutes in order to start the flow of breast milk.  Latching Teaching your baby how to latch on to your breast properly is very important. An improper latch can cause nipple pain and decreased milk supply for you and poor weight gain in your baby. Also, if your baby is not latched onto your nipple properly, he or she may swallow some air during feeding. This can make your baby fussy. Burping your baby when you switch breasts during the feeding can help to get rid of the air. However, teaching your baby to latch on properly is still the best way to prevent fussiness from swallowing air while breastfeeding. Signs that your baby has successfully latched on to your nipple:  Silent tugging or silent sucking, without causing you pain.  Swallowing heard between every 3-4 sucks.  Muscle movement above and in front of his or her ears while sucking.  Signs that your baby has not successfully latched on to nipple:  Sucking sounds or smacking sounds from your baby while breastfeeding.  Nipple pain.  If you think your baby has not latched on correctly, slip your finger into the corner of your baby's mouth to break the suction and place it between your baby's gums. Attempt breastfeeding initiation again. Signs of Successful Breastfeeding Signs from your baby:  A gradual decrease in the number of sucks or complete cessation of sucking.  Falling asleep.  Relaxation of his or her body.  Retention of a small amount of milk in his or her mouth.  Letting go of your breast by himself or herself.  Signs from you:  Breasts that have increased in firmness, weight, and size 1-3 hours after feeding.  Breasts that are softer immediately after breastfeeding.  Increased milk volume, as well as a change in milk consistency and color by the fifth day of  breastfeeding.  Nipples that are not sore, cracked, or bleeding.  Signs That Your Baby is Getting Enough Milk  Wetting at least 1-2 diapers during the first 24 hours after birth.  Wetting at least 5-6 diapers every 24 hours for the first week after birth. The urine should be clear or pale yellow by 5 days after birth.  Wetting 6-8 diapers every 24 hours as your baby continues to grow and develop.  At least 3 stools in a 24-hour period by age 5 days. The stool should be soft and yellow.  At least 3 stools in a 24-hour period by age 7 days. The stool should be seedy and yellow.  No loss of weight greater than 10% of birth weight during the first 3 days of age.  Average weight gain of 4-7 ounces (113-198 g) per week after age 4 days.  Consistent daily weight gain by age 5 days, without weight loss after the age of 2 weeks.  After a feeding, your baby may spit up a small amount. This is common. Breastfeeding frequency and duration Frequent feeding will help you make more milk and can prevent sore nipples and breast engorgement. Breastfeed when   you feel the need to reduce the fullness of your breasts or when your baby shows signs of hunger. This is called "breastfeeding on demand." Avoid introducing a pacifier to your baby while you are working to establish breastfeeding (the first 4-6 weeks after your baby is born). After this time you may choose to use a pacifier. Research has shown that pacifier use during the first year of a baby's life decreases the risk of sudden infant death syndrome (SIDS). Allow your baby to feed on each breast as long as he or she wants. Breastfeed until your baby is finished feeding. When your baby unlatches or falls asleep while feeding from the first breast, offer the second breast. Because newborns are often sleepy in the first few weeks of life, you may need to awaken your baby to get him or her to feed. Breastfeeding times will vary from baby to baby. However,  the following rules can serve as a guide to help you ensure that your baby is properly fed:  Newborns (babies 4 weeks of age or younger) may breastfeed every 1-3 hours.  Newborns should not go longer than 3 hours during the day or 5 hours during the night without breastfeeding.  You should breastfeed your baby a minimum of 8 times in a 24-hour period until you begin to introduce solid foods to your baby at around 6 months of age.  Breast milk pumping Pumping and storing breast milk allows you to ensure that your baby is exclusively fed your breast milk, even at times when you are unable to breastfeed. This is especially important if you are going back to work while you are still breastfeeding or when you are not able to be present during feedings. Your lactation consultant can give you guidelines on how long it is safe to store breast milk. A breast pump is a machine that allows you to pump milk from your breast into a sterile bottle. The pumped breast milk can then be stored in a refrigerator or freezer. Some breast pumps are operated by hand, while others use electricity. Ask your lactation consultant which type will work best for you. Breast pumps can be purchased, but some hospitals and breastfeeding support groups lease breast pumps on a monthly basis. A lactation consultant can teach you how to hand express breast milk, if you prefer not to use a pump. Caring for your breasts while you breastfeed Nipples can become dry, cracked, and sore while breastfeeding. The following recommendations can help keep your breasts moisturized and healthy:  Avoid using soap on your nipples.  Wear a supportive bra. Although not required, special nursing bras and tank tops are designed to allow access to your breasts for breastfeeding without taking off your entire bra or top. Avoid wearing underwire-style bras or extremely tight bras.  Air dry your nipples for 3-4minutes after each feeding.  Use only cotton  bra pads to absorb leaked breast milk. Leaking of breast milk between feedings is normal.  Use lanolin on your nipples after breastfeeding. Lanolin helps to maintain your skin's normal moisture barrier. If you use pure lanolin, you do not need to wash it off before feeding your baby again. Pure lanolin is not toxic to your baby. You may also hand express a few drops of breast milk and gently massage that milk into your nipples and allow the milk to air dry.  In the first few weeks after giving birth, some women experience extremely full breasts (engorgement). Engorgement can make your   breasts feel heavy, warm, and tender to the touch. Engorgement peaks within 3-5 days after you give birth. The following recommendations can help ease engorgement:  Completely empty your breasts while breastfeeding or pumping. You may want to start by applying warm, moist heat (in the shower or with warm water-soaked hand towels) just before feeding or pumping. This increases circulation and helps the milk flow. If your baby does not completely empty your breasts while breastfeeding, pump any extra milk after he or she is finished.  Wear a snug bra (nursing or regular) or tank top for 1-2 days to signal your body to slightly decrease milk production.  Apply ice packs to your breasts, unless this is too uncomfortable for you.  Make sure that your baby is latched on and positioned properly while breastfeeding.  If engorgement persists after 48 hours of following these recommendations, contact your health care provider or a lactation consultant. Overall health care recommendations while breastfeeding  Eat healthy foods. Alternate between meals and snacks, eating 3 of each per day. Because what you eat affects your breast milk, some of the foods may make your baby more irritable than usual. Avoid eating these foods if you are sure that they are negatively affecting your baby.  Drink milk, fruit juice, and water to  satisfy your thirst (about 10 glasses a day).  Rest often, relax, and continue to take your prenatal vitamins to prevent fatigue, stress, and anemia.  Continue breast self-awareness checks.  Avoid chewing and smoking tobacco. Chemicals from cigarettes that pass into breast milk and exposure to secondhand smoke may harm your baby.  Avoid alcohol and drug use, including marijuana. Some medicines that may be harmful to your baby can pass through breast milk. It is important to ask your health care provider before taking any medicine, including all over-the-counter and prescription medicine as well as vitamin and herbal supplements. It is possible to become pregnant while breastfeeding. If birth control is desired, ask your health care provider about options that will be safe for your baby. Contact a health care provider if:  You feel like you want to stop breastfeeding or have become frustrated with breastfeeding.  You have painful breasts or nipples.  Your nipples are cracked or bleeding.  Your breasts are red, tender, or warm.  You have a swollen area on either breast.  You have a fever or chills.  You have nausea or vomiting.  You have drainage other than breast milk from your nipples.  Your breasts do not become full before feedings by the fifth day after you give birth.  You feel sad and depressed.  Your baby is too sleepy to eat well.  Your baby is having trouble sleeping.  Your baby is wetting less than 3 diapers in a 24-hour period.  Your baby has less than 3 stools in a 24-hour period.  Your baby's skin or the white part of his or her eyes becomes yellow.  Your baby is not gaining weight by 5 days of age. Get help right away if:  Your baby is overly tired (lethargic) and does not want to wake up and feed.  Your baby develops an unexplained fever. This information is not intended to replace advice given to you by your health care provider. Make sure you discuss  any questions you have with your health care provider. Document Released: 01/31/2005 Document Revised: 07/15/2015 Document Reviewed: 07/25/2012 Elsevier Interactive Patient Education  2017 [employer]  

## 2017-01-04 NOTE — Progress Notes (Signed)
   PRENATAL VISIT NOTE  Subjective:  Angela [name] is a [age] y.o. G2P0010 at 7041w1d being seen today for ongoing prenatal care.  She is currently monitored for the following issues for this low-risk pregnancy and has IBS (irritable bowel syndrome); Raynaud disease; Depression with anxiety; Smoker; Bilateral carpal tunnel syndrome; Supervision of other normal pregnancy, antepartum; Epistaxis; Gastroesophageal reflux disease without esophagitis; and Abdominal pain during pregnancy, second trimester on their problem list.  Patient reports no complaints.  Contractions: Irregular. Vag. Bleeding: None.  Movement: Present. Denies leaking of fluid.   The following portions of the patient's history were reviewed and updated as appropriate: allergies, current medications, past family history, past medical history, past social history, past surgical history and problem list. Problem list updated.  Objective:   Vitals:   01/04/17 0902  BP: 126/70  Pulse: 87  Weight: 166 lb (75.3 kg)    Fetal Status: Fetal Heart Rate (bpm): 152 Fundal Height: 36 cm Movement: Present  Presentation: Vertex  General:  Alert, oriented and cooperative. Patient is in no acute distress.  Skin: Skin is warm and dry. No rash noted.   Cardiovascular: Normal heart rate noted  Respiratory: Normal respiratory effort, no problems with respiration noted  Abdomen: Soft, gravid, appropriate for gestational age.  Pain/Pressure: Present     Pelvic: Cervical exam performed Dilation: 1 Effacement (%): 50 Station: -3  Extremities: Normal range of motion.  Edema: Trace  Mental Status:  Normal mood and affect. Normal behavior. Normal judgment and thought content.   Assessment and Plan:  Pregnancy: G2P0010 at 3541w1d  1. Supervision of other normal pregnancy, antepartum Cultures today - Strep Gp B NAA - GC[facility]Chlamydia probe amp [facility]  Preterm labor symptoms and general obstetric precautions including but not limited  to vaginal bleeding, contractions, leaking of fluid and fetal movement were reviewed in detail with the patient. Please refer to After Visit Summary for other counseling recommendations.  Return in 2 weeks (on 01/18/2017).   Reva Boresanya S Jasean Ambrosia, MD

## 2017-01-06 LAB — GC/CHLAMYDIA PROBE AMP (~~LOC~~) NOT AT ARMC
Chlamydia: NEGATIVE
Neisseria Gonorrhea: NEGATIVE

## 2017-01-06 LAB — STREP GP B NAA: Strep Gp B NAA: NEGATIVE

## 2017-01-09 ENCOUNTER — Telehealth: Payer: Self-pay

## 2017-01-09 NOTE — Telephone Encounter (Signed)
Patient called stating she woke up with a rash under the boobs some itching. She reports having a night sweat last night. I have advised patient to clean area with soap and water and apply some hydrocortisone cream. She can take benadryl for itching. I have advised her to call us back tomorrow if her symptoms do not improved. Patient voice understanding.

## 2017-01-10 ENCOUNTER — Ambulatory Visit (INDEPENDENT_AMBULATORY_CARE_PROVIDER_SITE_OTHER): Payer: BLUE CROSS/BLUE SHIELD | Admitting: Obstetrics and Gynecology

## 2017-01-10 ENCOUNTER — Encounter: Payer: Self-pay | Admitting: Obstetrics and Gynecology

## 2017-01-10 VITALS — BP 133/100 | HR 89 | Wt 166.0 lb

## 2017-01-10 DIAGNOSIS — Z3480 Encounter for supervision of other normal pregnancy, unspecified trimester: Secondary | ICD-10-CM

## 2017-01-10 DIAGNOSIS — Z3483 Encounter for supervision of other normal pregnancy, third trimester: Secondary | ICD-10-CM

## 2017-01-10 NOTE — Progress Notes (Signed)
   PRENATAL VISIT NOTE  Subjective:  Angela [name] is a 31 y.o. G2P0010 at 4191w0d being seen today for ongoing prenatal care.  She is currently monitored for the following issues for this low-risk pregnancy and has IBS (irritable bowel syndrome); Raynaud disease; Depression with anxiety; Smoker; Bilateral carpal tunnel syndrome; Supervision of other normal pregnancy, antepartum; Epistaxis; Gastroesophageal reflux disease without esophagitis; and Abdominal pain during pregnancy, second trimester on their problem list.  Patient reports hand, abdomen and chest pruritis since yesterday.  Contractions: Irregular. Vag. Bleeding: None.  Movement: Present. Denies leaking of fluid.   The following portions of the patient's history were reviewed and updated as appropriate: allergies, current medications, past family history, past medical history, past social history, past surgical history and problem list. Problem list updated.  Objective:   Vitals:   01/10/17 1452 01/10/17 1513  BP: (!) 125/93 (!) 133/100  Pulse: 87 89  Weight: 166 lb (75.3 kg)     Fetal Status: Fetal Heart Rate (bpm): 150 Fundal Height: 37 cm Movement: Present     General:  Alert, oriented and cooperative. Patient is in no acute distress.  Skin: Skin is warm and dry. No rash noted.   Cardiovascular: Normal heart rate noted  Respiratory: Normal respiratory effort, no problems with respiration noted  Abdomen: Soft, gravid, appropriate for gestational age.  Pain/Pressure: Present     Pelvic: Cervical exam deferred        Extremities: Normal range of motion.  Edema: Moderate pitting, indentation subsides rapidly  Mental Status:  Normal mood and affect. Normal behavior. Normal judgment and thought content.   Assessment and Plan:  Pregnancy: G2P0010 at 1091w0d  1. Supervision of other normal pregnancy, antepartum Patient is doing well with the exception of onset of pruritis yesterday.  Patient also with elevated BP today.  Patient denies any headache, visual changes, RUQ/epigastric pain Will send labs to rule out cholestasis of pregnancy or preeclampsia Patient to return later this week for BP check Patient informed of possibility of IOL if abnormal results - Bile acids, total - CBC - Comprehensive metabolic panel - Protein / creatinine ratio, urine  Term labor symptoms and general obstetric precautions including but not limited to vaginal bleeding, contractions, leaking of fluid and fetal movement were reviewed in detail with the patient. Please refer to After Visit Summary for other counseling recommendations.  Return for as scheduled.   Catalina AntiguaPeggy Doak Mah, MD

## 2017-01-11 ENCOUNTER — Inpatient Hospital Stay (HOSPITAL_COMMUNITY): Payer: BLUE CROSS/BLUE SHIELD | Admitting: Certified Registered Nurse Anesthetist

## 2017-01-11 ENCOUNTER — Encounter (HOSPITAL_COMMUNITY): Payer: Self-pay | Admitting: *Deleted

## 2017-01-11 ENCOUNTER — Inpatient Hospital Stay (HOSPITAL_COMMUNITY)
Admission: AD | Admit: 2017-01-11 | Discharge: 2017-01-14 | DRG: 805 | Disposition: A | Discharge status: Home / Self Care | Payer: BLUE CROSS/BLUE SHIELD | Source: Ambulatory Visit | Attending: Obstetrics & Gynecology | Admitting: Obstetrics & Gynecology

## 2017-01-11 ENCOUNTER — Encounter (HOSPITAL_BASED_OUTPATIENT_CLINIC_OR_DEPARTMENT_OTHER): Payer: Self-pay | Admitting: Obstetrics and Gynecology

## 2017-01-11 ENCOUNTER — Telehealth: Payer: Self-pay | Admitting: *Deleted

## 2017-01-11 ENCOUNTER — Telehealth: Payer: Self-pay | Admitting: Family Medicine

## 2017-01-11 DIAGNOSIS — Z23 Encounter for immunization: Secondary | ICD-10-CM | POA: Diagnosis not present

## 2017-01-11 DIAGNOSIS — Z3A37 37 weeks gestation of pregnancy: Secondary | ICD-10-CM | POA: Diagnosis not present

## 2017-01-11 DIAGNOSIS — O26613 Liver and biliary tract disorders in pregnancy, third trimester: Secondary | ICD-10-CM

## 2017-01-11 DIAGNOSIS — O26643 Intrahepatic cholestasis of pregnancy, third trimester: Secondary | ICD-10-CM

## 2017-01-11 DIAGNOSIS — F329 Major depressive disorder, single episode, unspecified: Secondary | ICD-10-CM | POA: Diagnosis present

## 2017-01-11 DIAGNOSIS — O9962 Diseases of the digestive system complicating childbirth: Secondary | ICD-10-CM | POA: Diagnosis present

## 2017-01-11 DIAGNOSIS — Z7982 Long term (current) use of aspirin: Secondary | ICD-10-CM

## 2017-01-11 DIAGNOSIS — K219 Gastro-esophageal reflux disease without esophagitis: Secondary | ICD-10-CM | POA: Diagnosis not present

## 2017-01-11 DIAGNOSIS — Q225 Ebstein's anomaly: Secondary | ICD-10-CM | POA: Diagnosis not present

## 2017-01-11 DIAGNOSIS — Z3490 Encounter for supervision of normal pregnancy, unspecified, unspecified trimester: Secondary | ICD-10-CM

## 2017-01-11 DIAGNOSIS — K831 Obstruction of bile duct: Secondary | ICD-10-CM | POA: Diagnosis not present

## 2017-01-11 DIAGNOSIS — Z818 Family history of other mental and behavioral disorders: Secondary | ICD-10-CM | POA: Diagnosis not present

## 2017-01-11 DIAGNOSIS — O2662 Liver and biliary tract disorders in childbirth: Principal | ICD-10-CM | POA: Diagnosis present

## 2017-01-11 DIAGNOSIS — O99344 Other mental disorders complicating childbirth: Secondary | ICD-10-CM | POA: Diagnosis not present

## 2017-01-11 DIAGNOSIS — Z3480 Encounter for supervision of other normal pregnancy, unspecified trimester: Secondary | ICD-10-CM

## 2017-01-11 DIAGNOSIS — Z87891 Personal history of nicotine dependence: Secondary | ICD-10-CM

## 2017-01-11 DIAGNOSIS — F419 Anxiety disorder, unspecified: Secondary | ICD-10-CM | POA: Diagnosis not present

## 2017-01-11 HISTORY — DX: Obstruction of bile duct: K83.1

## 2017-01-11 HISTORY — DX: Raynaud's syndrome without gangrene: I73.00

## 2017-01-11 HISTORY — DX: Intrahepatic cholestasis of pregnancy, third trimester: O26.643

## 2017-01-11 LAB — CBC
HCT: 36.3 % (ref 36.0–46.0)
HEMATOCRIT: 37.2 % (ref 34.0–46.6)
Hemoglobin: 11.9 g/dL (ref 11.1–15.9)
Hemoglobin: 11.4 g/dL — ABNORMAL LOW (ref 12.0–15.0)
MCH: 26.7 pg (ref 26.6–33.0)
MCH: 27.3 pg (ref 26.0–34.0)
MCHC: 32 g/dL (ref 31.5–35.7)
MCHC: 31.4 g/dL (ref 30.0–36.0)
MCV: 83 fL (ref 79–97)
MCV: 86.8 fL (ref 78.0–100.0)
PLATELETS: 155 10*3/uL (ref 150–379)
PLATELETS: 146 10*3/uL — AB (ref 150–400)
RBC: 4.46 x10E6/uL (ref 3.77–5.28)
RBC: 4.18 MIL/uL (ref 3.87–5.11)
RDW: 14.7 % (ref 12.3–15.4)
RDW: 14.3 % (ref 11.5–15.5)
WBC: 5.7 10*3/uL (ref 3.4–10.8)
WBC: 5.9 10*3/uL (ref 4.0–10.5)

## 2017-01-11 LAB — COMPREHENSIVE METABOLIC PANEL
A/G RATIO: 1.2 (ref 1.2–2.2)
ALK PHOS: 311 IU/L — AB (ref 39–117)
ALT: 8 IU/L (ref 0–32)
AST: 22 IU/L (ref 0–40)
Albumin: 3.2 g/dL — ABNORMAL LOW (ref 3.5–5.5)
BUN/Creatinine Ratio: 14 (ref 9–23)
BUN: 9 mg/dL (ref 6–20)
Bilirubin Total: <0.2 mg/dL (ref 0.0–1.2)
CALCIUM: 8.8 mg/dL (ref 8.7–10.2)
CO2: 21 mmol/L (ref 20–29)
CREATININE: 0.63 mg/dL (ref 0.57–1.00)
Chloride: 104 mmol/L (ref 96–106)
GFR calc Af Amer: 139 mL/min/{1.73_m2} (ref >59)
GFR calc non Af Amer: 121 mL/min/{1.73_m2} (ref >59)
GLOBULIN, TOTAL: 2.6 g/dL (ref 1.5–4.5)
Glucose: 92 mg/dL (ref 65–99)
POTASSIUM: 3.9 mmol/L (ref 3.5–5.2)
SODIUM: 140 mmol/L (ref 134–144)
Total Protein: 5.8 g/dL — ABNORMAL LOW (ref 6.0–8.5)

## 2017-01-11 LAB — TYPE AND SCREEN
ABO/RH(D): O POS
Antibody Screen: NEGATIVE

## 2017-01-11 LAB — ABO/RH: ABO/RH(D): O POS

## 2017-01-11 LAB — PROTEIN / CREATININE RATIO, URINE
CREATININE, UR: 45.5 mg/dL
PROTEIN UR: 16 mg/dL
PROTEIN/CREAT RATIO: 352 mg/g{creat} — AB (ref 0–200)

## 2017-01-11 LAB — BILE ACIDS, TOTAL: BILE ACIDS TOTAL: 27.1 umol/L — AB (ref 4.7–24.5)

## 2017-01-11 MED ORDER — LACTATED RINGERS IV SOLN: 500.0000 mL | INTRAVENOUS | Status: DC | PRN

## 2017-01-11 MED ORDER — EPHEDRINE 5 MG/ML INJ: 10.0000 mg | INTRAVENOUS | Status: DC | PRN

## 2017-01-11 MED ORDER — MISOPROSTOL 50MCG HALF TABLET
50.0000 ug | ORAL_TABLET | ORAL | Status: DC | PRN
  Administered 2017-01-12: 50 ug via ORAL
  Filled 2017-01-11: qty 1

## 2017-01-11 MED ORDER — OXYTOCIN BOLUS FROM INFUSION
500.0000 mL | Freq: Once | INTRAVENOUS | Status: AC
  Administered 2017-01-12: 500 mL via INTRAVENOUS

## 2017-01-11 MED ORDER — FLEET ENEMA 7-19 GM/118ML RE ENEM: 1.0000 | ENEMA | RECTAL | Status: DC | PRN

## 2017-01-11 MED ORDER — ONDANSETRON HCL 4 MG/2ML IJ SOLN: 4.0000 mg | Freq: Four times a day (QID) | INTRAMUSCULAR | Status: DC | PRN

## 2017-01-11 MED ORDER — TERBUTALINE SULFATE 1 MG/ML IJ SOLN: 0.2500 mg | Freq: Once | INTRAMUSCULAR | Status: DC | PRN

## 2017-01-11 MED ORDER — OXYTOCIN 40 UNITS IN LACTATED RINGERS INFUSION - SIMPLE MED
2.5000 [IU]/h | INTRAVENOUS | Status: DC
  Administered 2017-01-12: 2.5 [IU]/h via INTRAVENOUS

## 2017-01-11 MED ORDER — MISOPROSTOL 25 MCG QUARTER TABLET
25.0000 ug | ORAL_TABLET | ORAL | Status: DC | PRN
  Administered 2017-01-11: 25 ug via VAGINAL
  Filled 2017-01-11: qty 1

## 2017-01-11 MED ORDER — LACTATED RINGERS IV SOLN: 500.0000 mL | Freq: Once | INTRAVENOUS | Status: DC

## 2017-01-11 MED ORDER — LACTATED RINGERS IV SOLN
INTRAVENOUS | Status: DC
  Administered 2017-01-11 – 2017-01-12 (×5): via INTRAVENOUS

## 2017-01-11 MED ORDER — FENTANYL 2.5 MCG/ML BUPIVACAINE 1/10 % EPIDURAL INFUSION (WH - ANES)
14.0000 mL/h | INTRAMUSCULAR | Status: DC | PRN
  Administered 2017-01-12: 14 mL/h via EPIDURAL
  Filled 2017-01-11: qty 100

## 2017-01-11 MED ORDER — ACETAMINOPHEN 325 MG PO TABS
650.0000 mg | ORAL_TABLET | ORAL | Status: DC | PRN
  Administered 2017-01-13: 650 mg via ORAL

## 2017-01-11 MED ORDER — OXYCODONE-ACETAMINOPHEN 5-325 MG PO TABS: 2.0000 | ORAL_TABLET | ORAL | Status: DC | PRN

## 2017-01-11 MED ORDER — OXYCODONE-ACETAMINOPHEN 5-325 MG PO TABS: 1.0000 | ORAL_TABLET | ORAL | Status: DC | PRN

## 2017-01-11 MED ORDER — PHENYLEPHRINE 40 MCG/ML (10ML) SYRINGE FOR IV PUSH (FOR BLOOD PRESSURE SUPPORT)
80.0000 ug | PREFILLED_SYRINGE | INTRAVENOUS | Status: DC | PRN
  Filled 2017-01-11: qty 10

## 2017-01-11 MED ORDER — FENTANYL CITRATE (PF) 100 MCG/2ML IJ SOLN
100.0000 ug | INTRAMUSCULAR | Status: DC | PRN
  Administered 2017-01-11 – 2017-01-12 (×3): 100 ug via INTRAVENOUS
  Filled 2017-01-11 (×3): qty 2

## 2017-01-11 MED ORDER — LIDOCAINE HCL (PF) 1 % IJ SOLN
30.0000 mL | INTRAMUSCULAR | Status: DC | PRN
  Filled 2017-01-11: qty 30

## 2017-01-11 MED ORDER — PHENYLEPHRINE 40 MCG/ML (10ML) SYRINGE FOR IV PUSH (FOR BLOOD PRESSURE SUPPORT): 80.0000 ug | PREFILLED_SYRINGE | INTRAVENOUS | Status: DC | PRN

## 2017-01-11 MED ORDER — SOD CITRATE-CITRIC ACID 500-334 MG/5ML PO SOLN: 30.0000 mL | ORAL | Status: DC | PRN

## 2017-01-11 MED ORDER — DIPHENHYDRAMINE HCL 50 MG/ML IJ SOLN
12.5000 mg | INTRAMUSCULAR | Status: DC | PRN
  Filled 2017-01-11: qty 1

## 2017-01-11 NOTE — Anesthesia Pain Management Evaluation Note (Signed)
  [facility] Pain Management Visit Note  Patient: Angela [name], 31 y.o., female  "Hello [facility] am [facility]. We have an anesthesia team available at all times to provide care throughout the hospital, including epidural management and anesthesia for C-section. I don't know your plan for the delivery whether it a natural birth, water birth, IV sedation, nitrous supplementation, doula or epidural, but we want to meet your pain goals."   1.Was your pain managed to your expectations on prior hospitalizations?   No prior hospitalizations  2.What is your expectation for pain management during this hospitalization?     Epidural  3.How can we help you reach that goal? Epidural, patient is aware of pain control options.  Record the patient's initial score and the patient's pain goal.   Pain: 0  Pain Goal: 5 [facility] wants you to be able to say your pain was always managed very well.  Medford Staheli L 01/11/2017

## 2017-01-11 NOTE — Telephone Encounter (Signed)
Called pt to adv bile acids are elevated and need to direct admit per Dr. Jolayne Pantheronstant. Spoke to Dr. Adrian BlackwaterStinson, attending, he was already aware of result. Gave pt info to attending and pt aware to report to [facility] to check in and L&D will come get her.

## 2017-01-11 NOTE — H&P (Signed)
LABOR AND DELIVERY ADMISSION HISTORY AND PHYSICAL NOTE  Angela [name] is a 31 y.o. female G2P0010 with IUP at 3330w1d by US presenting for IOL due to cholestasis. Patient was seen in clinic yesterday at routine prenatal visit complaining for pruritis. Labs were drawn and notable for elevated bile acids to 27.   She reports positive fetal movement. She denies leakage of fluid or vaginal bleeding.  Pren[facility] History/Complications: [facility] Pregnancy complications:  - cholestasis  Past Medical History: Past Medical History:  Diagnosis Date  . Bilateral carpal tunnel syndrome 11/18/2015  . Depression with anxiety 05/07/2014  . GERD (gastroesophageal reflux disease)   . IBS (irritable bowel syndrome)   . Migraines   . Missed abortion 03/26/2014  . Multiple thyroid nodules   . Painful menstrual periods 11/13/2014  . Raynaud disease     Past Surgical History: Past Surgical History:  Procedure Laterality Date  . COLONOSCOPY  11/03/11   [place]:colonic mucosa appeared normal  . DG SELECTED HSG [facility] ONLY    . WISDOM TOOTH EXTRACTION      Obstetrical History: OB History    Gravida Para Term Preterm AB Living   2 0 0 0 1 0   SAB TAB Ectopic Multiple Live Births   1 0 0 0 0      Allergies: No Known Allergies  Medications Prior to Admission  Medication Sig Dispense Refill Last Dose  . aspirin EC 81 MG tablet Take 81 mg by mouth daily.   Taking  . ferrous sulfate (FERROUSUL) 325 (65 FE) MG tablet Take 1 tablet (325 mg total) by mouth 2 (two) times daily. 60 tablet 1 Taking  . omeprazole (PRILOSEC) 20 MG capsule Take 1 capsule (20 mg total) by mouth daily. 30 capsule 4 Taking  . ondansetron (ZOFRAN ODT) 4 MG disintegrating tablet Take 1 tablet (4 mg total) by mouth every 6 (six) hours as needed for nausea. 20 tablet 0 Taking  . polyethylene glycol powder (MIRALAX) powder Take 17 g by mouth daily. To prevent constipation 255 g prn Taking  . Prenatal Vit-Fe Fumarate-FA (PRENATAL  MULTIVITAMIN) TABS tablet Take 1 tablet by mouth daily at 12 noon.   Taking  . sertraline (ZOLOFT) 50 MG tablet TAKE 1 TABLET BY MOUTH ONCE DAILY. 30 tablet 3 Taking     Review of Systems  All systems reviewed and negative except as stated in HPI  Physical Exam Blood pressure 133/88, pulse 80, temperature 98.1 F (36.7 C), temperature source Oral, resp. rate 20, height 5\' 2"  (1.575 m), weight 75.3 kg (166 lb), last menstrual period 04/26/2016. General appearance: alert, cooperative, appears stated age and no distress Lungs: clear to auscultation bilaterally Heart: regular rate and rhythm Abdomen: soft, non-tender; bowel sounds normal Extremities: No calf swelling or tenderness Presentation: cephalic Fetal monitoring: cat 1 Uterine activity: irregular contraction Dilation: 1 Effacement (%): Thick Station: -3 Exam by:: Valentina Lucks. Woods, RN  Prenatal labs: ABO, Rh: O/Positive/-- (05/01 1102) Antibody: Negative (05/01 1102) Rubella: 2.01 (05/01 1102) RPR: Non Reactive (09/26 0852)  HBsAg: Negative (05/01 1102)  HIV:   nr GC/Chlamydia: neg GBS: Negative (11/21 1015)  2 hr Glucola: 76/134/115 Genetic screening: normal Anatomy US: normal  Prenatal Transfer Tool  Maternal Diabetes: No Genetic Screening: normal Maternal Ultrasounds/Referrals: Normal Fetal Ultrasounds or other Referrals:  None Maternal Substance Abuse:  No Significant Maternal Medications:  Meds include: Zoloft Significant Maternal Lab Results: Lab values include: Other: BA 27.1  Results for orders placed or performed in visit on  01/10/17 (from the past 24 hour(s))  CBC   Collection Time: 01/10/17  3:20 PM  Result Value Ref Range   WBC 5.7 3.4 - 10.8 x10E3/uL   RBC 4.46 3.77 - 5.28 x10E6/uL   Hemoglobin 11.9 11.1 - 15.9 g/dL   Hematocrit 91.437.2 78.234.0 - 46.6 %   MCV 83 79 - 97 fL   MCH 26.7 26.6 - 33.0 pg   MCHC 32.0 31.5 - 35.7 g/dL   RDW 95.614.7 21.312.3 - 08.615.4 %   Platelets 155 150 - 379 x10E3/uL  Comprehensive  metabolic panel   Collection Time: 01/10/17  3:20 PM  Result Value Ref Range   Glucose 92 65 - 99 mg/dL   BUN 9 6 - 20 mg/dL   Creatinine, Ser 5.780.63 0.57 - 1.00 mg/dL   GFR calc non Af Amer 121 >59 mL/min/1.73   GFR calc Af Amer 139 >59 mL/min/1.73   BUN/Creatinine Ratio 14 9 - 23   Sodium 140 134 - 144 mmol/L   Potassium 3.9 3.5 - 5.2 mmol/L   Chloride 104 96 - 106 mmol/L   CO2 21 20 - 29 mmol/L   Calcium 8.8 8.7 - 10.2 mg/dL   Total Protein 5.8 (L) 6.0 - 8.5 g/dL   Albumin 3.2 (L) 3.5 - 5.5 g/dL   Globulin, Total 2.6 1.5 - 4.5 g/dL   Albumin/Globulin Ratio 1.2 1.2 - 2.2   Bilirubin Total <0.2 0.0 - 1.2 mg/dL   Alkaline Phosphatase 311 (H) 39 - 117 IU/L   AST 22 0 - 40 IU/L   ALT 8 0 - 32 IU/L  Bile acids, total   Collection Time: 01/10/17  3:45 PM  Result Value Ref Range   Bile Acids Total 27.1 (H) 4.7 - 24.5 umol/L    Patient Active Problem List   Diagnosis Date Noted  . Cholestasis of pregnancy in third trimester 01/11/2017  . Gastroesophageal reflux disease without esophagitis 10/07/2016  . Abdominal pain during pregnancy, second trimester 10/07/2016  . Epistaxis 07/21/2016  . Supervision of other normal pregnancy, antepartum 06/14/2016  . Bilateral carpal tunnel syndrome 11/18/2015  . IBS (irritable bowel syndrome) 05/07/2014  . Raynaud disease 05/07/2014  . Depression with anxiety 05/07/2014  . Smoker 05/07/2014    Assessment: Angela [name] is a 31 y.o. G2P0010 at 5242w1d here for IOL due to cholestasis.  #Labor: Admit to birthing suites. Start induction with Cytotec. Place FB when able. #Pain: Per maternal request #FWB: Cat 1 #ID: gbs neg #MOF: breast #MOC: POPs #Circ: N/A,  female  Marthenia RollingScott Bland 01/11/2017, 10:13 AM PGY-1  OB FELLOW HISTORY AND PHYSICAL ATTESTATION  I confirm that I have verified the information documented in the resident's note and that I have also personally reperformed the physical exam and all medical decision making activities.  I agree with above documentation and have made edits as needed.   Caryl AdaJazma Lerline Valdivia OB Fellow 01/11/2017, 2:51 PM

## 2017-01-11 NOTE — Progress Notes (Signed)
LABOR PROGRESS NOTE  Angela [name] is a 31 y.o. G2P0010 at 6581w1d  admitted for IOL for cholestasis of pregnancy.   Subjective: Patient doing well. Does reports felling regular contractions. Had IV pain medicine earlier.   Objective: BP 134/85   Pulse 68   Temp 98.7 F (37.1 C) (Oral)   Resp 16   Ht 5\' 2"  (1.575 m)   Wt 166 lb (75.3 kg)   LMP 04/26/2016   BMI 30.36 kg/m  or  Vitals:   01/11/17 1846 01/11/17 1929 01/11/17 2014 01/11/17 2200  BP: 130/79 134/83 134/85   Pulse: 71 74 68   Resp: 18 18  16   Temp:  98.7 F (37.1 C)    TempSrc:  Oral    Weight:      Height:       Last SVE at 15:40 Dilation: 1.5 Effacement (%): Thick Cervical Position: Posterior Station: -3 Presentation: Vertex Exam by:: Dr. Parke SimmersBland   FB remains in place  FHT: baseline rate 140, moderate varibility, +acel, no decel Toco: ctx 1-3 min  Assessment / Plan: 31 y.o. G2P0010 at 5181w1d here for IOL for cholestasis of pregnancy  Labor: FB placed at 1540 remains in place. S/p cytotec x1, has not been able to get additional doses as she is contracting regularly. Expectant management for now. Plan to start Pitocin later Fetal Wellbeing:  Cat I Pain Control:  IV pain meds per pt request Anticipated MOD:  SVD  Frederik PearJulie P Chioma Mukherjee, MD 01/11/2017, 10:51 PM

## 2017-01-11 NOTE — Telephone Encounter (Signed)
Per Dr. Adrian BlackwaterStinson, [facility] is [facility] who has already been taking care of.

## 2017-01-11 NOTE — Progress Notes (Signed)
Angela [name] is a 31 y.o. G2P0010 at 10113w1d.  Subjective: Patient comfortable with good pain control, we discussed FB and answered questions.   She consented to foley bulb  Objective: BP 133/88   Pulse 80   Temp 98.1 F (36.7 C) (Oral)   Resp 20   Ht 5\' 2"  (1.575 m)   Wt 166 lb (75.3 kg)   LMP 04/26/2016   BMI 30.36 kg/m    FHT:  FHR: 140 bpm, variability: appropriate,  accelerations:  15x15,  decelerations:  no UC:   Q 2-1016minutes, 60-110 Dilation: 1 Effacement (%): Thick Cervical Position: Posterior Station: -3 Presentation: Vertex Exam by:: Valentina Lucks. Woods, RN  Labs: Results for orders placed or performed during the hospital encounter of 01/11/17 (from the past 24 hour(s))  CBC     Status: Abnormal   Collection Time: 01/11/17 10:30 AM  Result Value Ref Range   WBC 5.9 4.0 - 10.5 K/uL   RBC 4.18 3.87 - 5.11 MIL/uL   Hemoglobin 11.4 (L) 12.0 - 15.0 g/dL   HCT 16.136.3 09.636.0 - 04.546.0 %   MCV 86.8 78.0 - 100.0 fL   MCH 27.3 26.0 - 34.0 pg   MCHC 31.4 30.0 - 36.0 g/dL   RDW 40.914.3 81.111.5 - 91.415.5 %   Platelets 146 (L) 150 - 400 K/uL  Type and screen [facility]     Status: None   Collection Time: 01/11/17 10:30 AM  Result Value Ref Range   ABO/RH(D) O POS    Antibody Screen NEG    Sample Expiration 01/14/2017   ABO/Rh     Status: None   Collection Time: 01/11/17 10:30 AM  Result Value Ref Range   ABO/RH(D) O POS     Assessment / Plan: FB placed @1545  6413w1d week IUP Labor: IOL, cytotek/FB, plan for pitocin  Fetal Wellbeing:  Category 1 Pain Control:  Per maternal request Anticipated MOD:  NSVD  Marthenia RollingBland, Prithvi Kooi, DO 01/11/2017 3:53 PM

## 2017-01-11 NOTE — Telephone Encounter (Signed)
Called by [facility] - elevated bile acid salts at 27. Please call patient - she will need to be induced today.   Thank you.

## 2017-01-12 ENCOUNTER — Inpatient Hospital Stay (HOSPITAL_COMMUNITY): Payer: BLUE CROSS/BLUE SHIELD | Admitting: Anesthesiology

## 2017-01-12 ENCOUNTER — Encounter (HOSPITAL_COMMUNITY): Payer: Self-pay | Admitting: Emergency Medicine

## 2017-01-12 DIAGNOSIS — Z3A37 37 weeks gestation of pregnancy: Secondary | ICD-10-CM

## 2017-01-12 DIAGNOSIS — O2662 Liver and biliary tract disorders in childbirth: Secondary | ICD-10-CM

## 2017-01-12 DIAGNOSIS — K831 Obstruction of bile duct: Secondary | ICD-10-CM

## 2017-01-12 LAB — RPR: RPR: NONREACTIVE

## 2017-01-12 MED ORDER — PANTOPRAZOLE SODIUM 40 MG PO TBEC
40.0000 mg | DELAYED_RELEASE_TABLET | Freq: Every day | ORAL | Status: DC
  Administered 2017-01-12: 40 mg via ORAL
  Filled 2017-01-12: qty 1

## 2017-01-12 MED ORDER — SODIUM CHLORIDE 0.9 % IV SOLN
2.0000 g | Freq: Four times a day (QID) | INTRAVENOUS | Status: DC
  Administered 2017-01-12: 2 g via INTRAVENOUS
  Filled 2017-01-12 (×3): qty 2000

## 2017-01-12 MED ORDER — PHENYLEPHRINE 40 MCG/ML (10ML) SYRINGE FOR IV PUSH (FOR BLOOD PRESSURE SUPPORT): 80.0000 ug | PREFILLED_SYRINGE | INTRAVENOUS | Status: DC | PRN

## 2017-01-12 MED ORDER — DIPHENHYDRAMINE HCL 50 MG/ML IJ SOLN
12.5000 mg | INTRAMUSCULAR | Status: DC | PRN
  Administered 2017-01-12: 12.5 mg via INTRAVENOUS

## 2017-01-12 MED ORDER — ZOLPIDEM TARTRATE 5 MG PO TABS: 5.0000 mg | ORAL_TABLET | Freq: Every evening | ORAL | Status: DC | PRN

## 2017-01-12 MED ORDER — IBUPROFEN 600 MG PO TABS
600.0000 mg | ORAL_TABLET | Freq: Four times a day (QID) | ORAL | Status: DC
  Administered 2017-01-12 – 2017-01-14 (×8): 600 mg via ORAL
  Filled 2017-01-12 (×8): qty 1

## 2017-01-12 MED ORDER — OXYTOCIN 40 UNITS IN LACTATED RINGERS INFUSION - SIMPLE MED
1.0000 m[IU]/min | INTRAVENOUS | Status: DC
  Administered 2017-01-12: 1 m[IU]/min via INTRAVENOUS
  Filled 2017-01-12: qty 1000

## 2017-01-12 MED ORDER — EPHEDRINE 5 MG/ML INJ: 10.0000 mg | INTRAVENOUS | Status: DC | PRN

## 2017-01-12 MED ORDER — PRENATAL MULTIVITAMIN CH
1.0000 | ORAL_TABLET | Freq: Every day | ORAL | Status: DC
  Administered 2017-01-13 – 2017-01-14 (×2): 1 via ORAL
  Filled 2017-01-12 (×2): qty 1

## 2017-01-12 MED ORDER — LACTATED RINGERS IV SOLN: 500.0000 mL | Freq: Once | INTRAVENOUS | Status: DC

## 2017-01-12 MED ORDER — DIPHENHYDRAMINE HCL 25 MG PO CAPS: 25.0000 mg | ORAL_CAPSULE | Freq: Four times a day (QID) | ORAL | Status: DC | PRN

## 2017-01-12 MED ORDER — TETANUS-DIPHTH-ACELL PERTUSSIS 5-2.5-18.5 LF-MCG/0.5 IM SUSP: 0.5000 mL | Freq: Once | INTRAMUSCULAR | Status: DC

## 2017-01-12 MED ORDER — LIDOCAINE HCL (PF) 1 % IJ SOLN
INTRAMUSCULAR | Status: DC | PRN
  Administered 2017-01-12 (×2): 4 mL

## 2017-01-12 MED ORDER — SENNOSIDES-DOCUSATE SODIUM 8.6-50 MG PO TABS
2.0000 | ORAL_TABLET | ORAL | Status: DC
  Administered 2017-01-13 (×2): 2 via ORAL
  Filled 2017-01-12 (×2): qty 2

## 2017-01-12 MED ORDER — GENTAMICIN SULFATE 40 MG/ML IJ SOLN
5.0000 mg/kg | INTRAVENOUS | Status: DC
  Administered 2017-01-12: 380 mg via INTRAVENOUS
  Filled 2017-01-12: qty 9.5

## 2017-01-12 MED ORDER — SIMETHICONE 80 MG PO CHEW
80.0000 mg | CHEWABLE_TABLET | ORAL | Status: DC | PRN
Start: 2017-01-12 — End: 2017-01-14

## 2017-01-12 MED ORDER — ACETAMINOPHEN 325 MG PO TABS
650.0000 mg | ORAL_TABLET | ORAL | Status: DC | PRN
  Filled 2017-01-12: qty 2

## 2017-01-12 MED ORDER — ACETAMINOPHEN 500 MG PO TABS
1000.0000 mg | ORAL_TABLET | Freq: Once | ORAL | Status: AC
  Administered 2017-01-12: 1000 mg via ORAL
  Filled 2017-01-12: qty 2

## 2017-01-12 MED ORDER — OXYTOCIN 40 UNITS IN LACTATED RINGERS INFUSION - SIMPLE MED
1.0000 m[IU]/min | INTRAVENOUS | Status: DC
  Administered 2017-01-12: 2 m[IU]/min via INTRAVENOUS
  Administered 2017-01-12: 5 m[IU]/min via INTRAVENOUS

## 2017-01-12 MED ORDER — COCONUT OIL OIL
1.0000 "application " | TOPICAL_OIL | Status: DC | PRN
  Administered 2017-01-14: 1 via TOPICAL
  Filled 2017-01-12: qty 120

## 2017-01-12 MED ORDER — FENTANYL 2.5 MCG/ML BUPIVACAINE 1/10 % EPIDURAL INFUSION (WH - ANES): 14.0000 mL/h | INTRAMUSCULAR | Status: DC | PRN

## 2017-01-12 MED ORDER — METHYLERGONOVINE MALEATE 0.2 MG/ML IJ SOLN
INTRAMUSCULAR | Status: AC
  Administered 2017-01-12: 0.2 mg via INTRAMUSCULAR
  Filled 2017-01-12: qty 1

## 2017-01-12 MED ORDER — METHYLERGONOVINE MALEATE 0.2 MG/ML IJ SOLN
0.2000 mg | Freq: Once | INTRAMUSCULAR | Status: AC
  Administered 2017-01-12: 0.2 mg via INTRAMUSCULAR

## 2017-01-12 MED ORDER — BENZOCAINE-MENTHOL 20-0.5 % EX AERO
1.0000 "application " | INHALATION_SPRAY | CUTANEOUS | Status: DC | PRN
  Administered 2017-01-13 – 2017-01-14 (×2): 1 via TOPICAL
  Filled 2017-01-12 (×2): qty 56

## 2017-01-12 MED ORDER — SERTRALINE HCL 50 MG PO TABS
50.0000 mg | ORAL_TABLET | Freq: Every day | ORAL | Status: DC
  Administered 2017-01-12: 50 mg via ORAL
  Filled 2017-01-12 (×2): qty 1

## 2017-01-12 MED ORDER — ONDANSETRON HCL 4 MG PO TABS: 4.0000 mg | ORAL_TABLET | ORAL | Status: DC | PRN

## 2017-01-12 MED ORDER — WITCH HAZEL-GLYCERIN EX PADS: 1.0000 "application " | MEDICATED_PAD | CUTANEOUS | Status: DC | PRN

## 2017-01-12 MED ORDER — ONDANSETRON HCL 4 MG/2ML IJ SOLN: 4.0000 mg | INTRAMUSCULAR | Status: DC | PRN

## 2017-01-12 MED ORDER — DIBUCAINE 1 % RE OINT: 1.0000 "application " | TOPICAL_OINTMENT | RECTAL | Status: DC | PRN

## 2017-01-12 MED ORDER — FENTANYL 2.5 MCG/ML BUPIVACAINE 1/10 % EPIDURAL INFUSION (WH - ANES)
14.0000 mL/h | INTRAMUSCULAR | Status: DC | PRN
  Administered 2017-01-12: 14 mL/h via EPIDURAL
  Filled 2017-01-12: qty 100

## 2017-01-12 MED ORDER — MISOPROSTOL 25 MCG QUARTER TABLET
25.0000 ug | ORAL_TABLET | ORAL | Status: DC
  Filled 2017-01-12: qty 1

## 2017-01-12 NOTE — Progress Notes (Signed)
Angela [name] is a 31 y.o. G2P0010 at 5760w2d.  Subjective: Patient is feeling pain controlled on epidural, benadryl has controlled itching sensation and allowed her to take a good nap.   We discussed a cervical check and assesment to see if AROM was appropriate and she consented.  Objective: BP (!) 104/55 (BP Location: Right Arm)   Pulse 85   Temp 98.8 F (37.1 C) (Axillary)   Resp 16   Ht 5\' 2"  (1.575 m)   Wt 166 lb (75.3 kg)   LMP 04/26/2016   SpO2 99%   BMI 30.36 kg/m    FHT:  FHR: 145 bpm, variability: appropriate,  accelerations:  15x15,  decelerations:  none UC:   Q 2-394minutes, 50-90 Dilation: 4 Effacement (%): 50 Cervical Position: Middle Station: -2 Presentation: Vertex Exam by:: Dr Parke SimmersBland  Labs: No results found for this or any previous visit (from the past 24 hour(s)).  Assessment / Plan: dilated to 4/50%, bag was not palpable to where I felt confident in AROM, cytotek vaginal 25 and reassess in a few hours 2260w2d week IUP Labor: cytotek s/p foley bulb falling otu Fetal Wellbeing:  Category 1 Pain Control:  epidural Anticipated MOD:  nsvd  Marthenia RollingBland, Jolonda Gomm, DO 01/12/2017 1:12 PM

## 2017-01-12 NOTE — Progress Notes (Signed)
LABOR PROGRESS NOTE  Angela [name] is a 31 y.o. G2P0010 at 4746w1d  admitted for IOL for cholestasis of pregnancy.   Subjective: Patient doing well. Just got epdiural  Objective: BP 125/81   Pulse 70   Temp 99 F (37.2 C) (Axillary)   Resp 17   Ht 5\' 2"  (1.575 m)   Wt 166 lb (75.3 kg)   LMP 04/26/2016   SpO2 99%   BMI 30.36 kg/m  or  Vitals:   01/12/17 0411 01/12/17 0417 01/12/17 0421 01/12/17 0425  BP: 133/85 136/80 131/78 125/81  Pulse: 87 75 74 70  Resp:      Temp:      TempSrc:      SpO2: 100% 100% 100% 99%  Weight:      Height:       SVE: Dilation: 3 Effacement (%): 50 Cervical Position: Posterior Station: -3 Presentation: Vertex Exam by:: Degele, MD   FHT: baseline rate 140, moderate varibility, +acel, no decel Toco: ctx 1-4 min  Assessment / Plan: 31 y.o. G2P0010 at 4146w1d here for IOL for cholestasis of pregnancy  Labor: s/p FB, out at 2am. Has been contracting too much for cytotec. Pitocin started. Continue to titrate Fetal Wellbeing:  Cat I Pain Control:  epidural Anticipated MOD:  SVD  Frederik PearJulie P Degele, MD 01/12/2017, 4:30 AM

## 2017-01-12 NOTE — Anesthesia Procedure Notes (Deleted)
Epidural Patient location during procedure: OB  Staffing Anesthesiologist: Lewie LoronGermeroth, Dain Laseter, MD Performed: anesthesiologist   Preanesthetic Checklist Completed: patient identified, pre-op evaluation, timeout performed, IV checked, risks and benefits discussed and monitors and equipment checked  Epidural Patient position: sitting Prep: site prepped and draped and DuraPrep Patient monitoring: heart rate Approach: midline Location: L2-L3 Injection technique: LOR air and LOR saline  Needle:  Needle type: Tuohy  Needle gauge: 17 G Needle length: 9 cm Needle insertion depth: 6 cm Catheter type: closed end flexible Catheter size: 19 Gauge Catheter at skin depth: 11 cm Test dose: negative  Assessment Sensory level: T8 Events: blood not aspirated, injection not painful, no injection resistance, negative IV test and no paresthesia  Additional Notes Reason for block:procedure for pain

## 2017-01-12 NOTE — Anesthesia Preprocedure Evaluation (Deleted)
Anesthesia Evaluation  Patient identified by MRN, date of birth, ID band Patient awake    Reviewed: Allergy & Precautions, Patient's Chart, lab work & pertinent test results  Airway Mallampati: II  TM Distance: >3 FB Neck ROM: Full    Dental no notable dental hx.    Pulmonary neg pulmonary ROS, former smoker,    Pulmonary exam normal breath sounds clear to auscultation       Cardiovascular + Peripheral Vascular Disease  Normal cardiovascular exam Rhythm:Regular Rate:Normal     Neuro/Psych  Headaches, Anxiety    GI/Hepatic Neg liver ROS, GERD  ,  Endo/Other  negative endocrine ROS  Renal/GU negative Renal ROS     Musculoskeletal negative musculoskeletal ROS (+)   Abdominal   Peds  Hematology negative hematology ROS (+)   Anesthesia Other Findings   Reproductive/Obstetrics (+) Pregnancy                             Anesthesia Physical  Anesthesia Plan  ASA: II  Anesthesia Plan: Epidural   Post-op Pain Management:    Induction:   PONV Risk Score and Plan:   Airway Management Planned:   Additional Equipment:   Intra-op Plan:   Post-operative Plan:   Informed Consent: I have reviewed the patients History and Physical, chart, labs and discussed the procedure including the risks, benefits and alternatives for the proposed anesthesia with the patient or authorized representative who has indicated his/her understanding and acceptance.     Plan Discussed with:   Anesthesia Plan Comments:         Anesthesia Quick Evaluation  

## 2017-01-12 NOTE — Progress Notes (Signed)
Labor Progress Note Angela [name] is a 31 y.o. G2P0010 at 5550w2d presented for for IOL for cholestasis S: No complaints  O:  BP 122/73 (BP Location: Left Arm)   Pulse 82   Temp 98.9 F (37.2 C) (Oral)   Resp 16   Ht 5\' 2"  (1.575 m)   Wt 166 lb (75.3 kg)   LMP 04/26/2016   SpO2 99%   BMI 30.36 kg/m  EFM: 150 bpm/mod var/pos acels/no decels  CVE: Dilation: 4 Effacement (%): 50 Cervical Position: Middle Station: -2 Presentation: Vertex Exam by:: Dr Rachelle HoraMoss   A&P: 31 y.o. G2P0010 5750w2d here for IOL for cholestasis. #Labor: Cervix still thick. Stop pitocin and give 1 dose cytotec. Recheck around 1300. Probably AROM at that time.   Alyssandra Hulsebus, DO 10:30 AM

## 2017-01-12 NOTE — Progress Notes (Signed)
Angela [name] is a 31 y.o. G2P0010 at 768w2d admitted for IOL for cholestasis of pregnancy.  Subjective: Patient is doing well, denies any pain currently, though complains of some itching.  Objective: BP 122/73 (BP Location: Left Arm)   Pulse 82   Temp 98.9 F (37.2 C) (Oral)   Resp 16   Ht 5\' 2"  (1.575 m)   Wt 75.3 kg (166 lb)   LMP 04/26/2016   SpO2 99%   BMI 30.36 kg/m    FHT:  FHR: 135 bpm, variability: moderate,  accelerations:  15 x 15,  decelerations:  none UC:   Q 1-5 minutes, mild, duration 40-80 sec Dilation: 4 Effacement (%): 50 Cervical Position: Middle Station: -2 Presentation: Vertex Exam by:: Dr Rachelle HoraMoss  Labs: No results found for this or any previous visit (from the past 24 hour(s)).  Assessment / Plan: 31 y.o. G2P0010 at [redacted]w[redacted]d here for IOL for cholestasis of pregnancy  Labor: s/p FB, out at 2 am. Received 50 mcg oral cytotec at 9:20.  Fetal Wellbeing:  Category 1 Pain Control:  epidural Anticipated MOD:  SVD  Francie MassingBrown, Peggyann Zwiefelhofer, Medical Student 01/12/2017 11:02 AM

## 2017-01-12 NOTE — Progress Notes (Signed)
Labor Progress Note Angela [name] is a 31 y.o. G2P0010 at 432w2d presented for here for IOL for cholestasis. S: no complaints  O:  BP (!) 104/55 (BP Location: Right Arm)   Pulse 85   Temp 98.8 F (37.1 C) (Axillary)   Resp 16   Ht 5\' 2"  (1.575 m)   Wt 166 lb (75.3 kg)   LMP 04/26/2016   SpO2 99%   BMI 30.36 kg/m  EFM: 150 bpm/mod var/no decels  CVE: Dilation: 5 Effacement (%): 80 Cervical Position: Middle Station: -2 Presentation: Vertex Exam by:: Dr Rachelle HoraMoss   A&P: 31 y.o. G2P0010 7032w2d here for IOL for cholestasis. #Labor: Has made cervical change since my last exam. AROM with small amount of clear fluid, suspect SROM earlier. IUPC placed. Restart pitocin.  Rolm BookbinderAmber Audreena Sachdeva, DO 1:47 PM

## 2017-01-12 NOTE — Progress Notes (Signed)
Patients foley bulb was removed at 0159 not 0059.   Angela PondsAlexandria M Kalayah Leske, RN

## 2017-01-12 NOTE — Anesthesia Preprocedure Evaluation (Signed)
Anesthesia Evaluation  Patient identified by MRN, date of birth, ID band Patient awake    Reviewed: Allergy & Precautions, Patient's Chart, lab work & pertinent test results  Airway Mallampati: II  TM Distance: >3 FB Neck ROM: Full    Dental no notable dental hx.    Pulmonary neg pulmonary ROS, former smoker,    Pulmonary exam normal breath sounds clear to auscultation       Cardiovascular + Peripheral Vascular Disease  Normal cardiovascular exam Rhythm:Regular Rate:Normal     Neuro/Psych  Headaches, Anxiety    GI/Hepatic Neg liver ROS, GERD  ,  Endo/Other  negative endocrine ROS  Renal/GU negative Renal ROS     Musculoskeletal negative musculoskeletal ROS (+)   Abdominal   Peds  Hematology negative hematology ROS (+)   Anesthesia Other Findings   Reproductive/Obstetrics (+) Pregnancy                             Anesthesia Physical  Anesthesia Plan  ASA: II  Anesthesia Plan: Epidural   Post-op Pain Management:    Induction:   PONV Risk Score and Plan:   Airway Management Planned:   Additional Equipment:   Intra-op Plan:   Post-operative Plan:   Informed Consent: I have reviewed the patients History and Physical, chart, labs and discussed the procedure including the risks, benefits and alternatives for the proposed anesthesia with the patient or authorized representative who has indicated his/her understanding and acceptance.     Plan Discussed with:   Anesthesia Plan Comments:         Anesthesia Quick Evaluation

## 2017-01-12 NOTE — Anesthesia Procedure Notes (Signed)
Epidural Patient location during procedure: OB  Staffing Anesthesiologist: Deetra Booton, MD Performed: anesthesiologist   Preanesthetic Checklist Completed: patient identified, pre-op evaluation, timeout performed, IV checked, risks and benefits discussed and monitors and equipment checked  Epidural Patient position: sitting Prep: site prepped and draped and DuraPrep Patient monitoring: heart rate, continuous pulse ox and blood pressure Approach: midline Location: L2-L3 Injection technique: LOR air and LOR saline  Needle:  Needle type: Tuohy  Needle gauge: 17 G Needle length: 9 cm Needle insertion depth: 6 cm Catheter type: closed end flexible Catheter size: 19 Gauge Catheter at skin depth: 11 cm Test dose: negative  Assessment Sensory level: T8 Events: blood not aspirated, injection not painful, no injection resistance, negative IV test and no paresthesia  Additional Notes Reason for block:procedure for pain     

## 2017-01-13 ENCOUNTER — Telehealth: Payer: Self-pay | Admitting: Radiology

## 2017-01-13 LAB — TSH: TSH: 4.368 u[IU]/mL (ref 0.350–4.500)

## 2017-01-13 LAB — CBC
HCT: 34.7 % — ABNORMAL LOW (ref 36.0–46.0)
HEMOGLOBIN: 10.9 g/dL — AB (ref 12.0–15.0)
MCH: 27.3 pg (ref 26.0–34.0)
MCHC: 31.4 g/dL (ref 30.0–36.0)
MCV: 86.8 fL (ref 78.0–100.0)
Platelets: 124 10*3/uL — ABNORMAL LOW (ref 150–400)
RBC: 4 MIL/uL (ref 3.87–5.11)
RDW: 14.5 % (ref 11.5–15.5)
WBC: 10.6 10*3/uL — AB (ref 4.0–10.5)

## 2017-01-13 MED ORDER — PANTOPRAZOLE SODIUM 40 MG PO TBEC: 40.0000 mg | DELAYED_RELEASE_TABLET | Freq: Every day | ORAL | Status: DC

## 2017-01-13 MED ORDER — PANTOPRAZOLE SODIUM 20 MG PO TBEC
20.0000 mg | DELAYED_RELEASE_TABLET | Freq: Every day | ORAL | Status: DC
  Administered 2017-01-13: 20 mg via ORAL
  Filled 2017-01-13 (×2): qty 1

## 2017-01-13 MED ORDER — SERTRALINE HCL 50 MG PO TABS
50.0000 mg | ORAL_TABLET | Freq: Every day | ORAL | Status: DC
  Administered 2017-01-13: 50 mg via ORAL
  Filled 2017-01-13 (×2): qty 1

## 2017-01-13 NOTE — Telephone Encounter (Signed)
Left message on the cell phone voicemail postpartum appointment is scheduled for 02/10/17 @ 9:30, patient instructed to call [id number] if time and date does not work.

## 2017-01-13 NOTE — Progress Notes (Signed)
MOB was referred for history of depression/anxiety. * Referral screened out by Clinical Social Worker because none of the following criteria appear to apply: ~ History of anxiety/depression during this pregnancy, or of post-partum depression. ~ Diagnosis of anxiety and/or depression within last 3 years OR * MOB's symptoms currently being treated with medication and/or therapy. MOB is currently taking Zoloft.   Please contact the Clinical Social Worker if needs arise, by MOB request, or if MOB scores greater than 9/yes to question 10 on Edinburgh Postpartum Depression Screen.  Emmanuelle Coxe Boyd-Gilyard, MSW, LCSW Clinical Social Work [phone] 

## 2017-01-13 NOTE — Progress Notes (Signed)
POSTPARTUM PROGRESS NOTE  Post Partum Day 1 Subjective:  Angela [name] is a 31 y.o. [id number] 9587w2d s/p SVD complicated by triple I.  No acute events overnight.  Pt denies problems with ambulating, voiding or po intake, though experiences abdominal soreness that is worse with ambulating.  She denies nausea or vomiting.  Pain is well controlled; she is not experiencing pain at rest, though abdomen is tender to palpation (2/10 pain).  She received ibuprofen and acetaminophen overnight. She has not had bowel movement.  Lochia appropriate, per pt about the same as her monthly period. She reports sweating excessively; this morning she sweat through a gown, and her hair was damp with sweat. This resolved after taking a shower this morning. She otherwise feels well.   Objective: Blood pressure (!) 141/71, pulse 75, temperature 99.1 F (37.3 C), temperature source Oral, resp. rate 18, height 5\' 2"  (1.575 m), weight 75.3 kg (166 lb), last menstrual period 04/26/2016, SpO2 99 %, unknown if currently breastfeeding.  Physical Exam:  General: alert, cooperative and no distress Lochia:normal flow Chest: no respiratory distress Heart:regular rate, distal pulses intact Abdomen: soft, tender to light palpation inferior to umbilicus  Uterine Fundus: firm, appropriately tender DVT Evaluation: No calf swelling or tenderness Extremities: Some LE edema present  Recent Labs    01/10/17 1520 01/11/17 1030  HGB 11.9 11.4*  HCT 37.2 36.3    Assessment/Plan:  ASSESSMENT: Angela [name] is a 31 y.o. [id number] 5987w2d s/p SVD who is well-appearing and subjectively doing well. Her elevated temperature after receiving antipyretics overnight, her report of excessive sweating, and her localized abdominal tenderness raise suspicion for possible continued infection. However, clinical suspicion is low as her sweating is resolved, she denies malodorous discharge, she denies any pain at rest, and her temperature is coming  down. CBC this morning was 10.6 We will reassess vitals and consider starting antibiotics if suspicious for endometritis.   If patient continues to do well and chorioamnionitis is ruled out, plan for discharge tomorrow.   LOS: 2 days   Angela MassingMelissa  [name], MS3 01/13/2017, 10:25 AM   I confirm that I have verified the information documented in the student's note and that I have also personally reperformed the physical exam and all medical decision making activities.  CNM note: Patient was observed alert and oriented times 3 in bed. Her abdomen was tender to palpation but she denies that it was painful before palpation. Had not had any sweating since 7Am.  Recheck vitals and consider discharge tomorrow given that CBC normal.   Angela [name] CNM

## 2017-01-13 NOTE — Lactation Note (Signed)
This note was copied from a baby's chart. Lactation Consultation Note  Patient Name: Angela Caroleen HammanMagan Ligas Today's Date: 01/13/2017 Reason for consult: Initial assessment;Primapara;Early term 37-38.6wks;1st time breastfeeding Newborn is 8317 hours old.  Mom states baby latches but becomes sleepy at breast.  Discussed late preterm feeding policy and behaviors.  Mom holding baby skin to skin after bath.  Discussed pumping with symphony pump in addition to feeding on cue to stimulate milk supply and possibly giving additional calories if milk obtained.  Assisted with positioning baby in football hold.  Baby sleepy and not showing signs of hunger.  She does suck on a gloved finger well.  Pumping initiated.  Instructed to feed with cues, post pump every 2-3 hours and give baby expressed milk obtained with syringe.  Encouraged to call out for assist prn.  Maternal Data Has patient been taught Hand Expression?: Yes Does the patient have breastfeeding experience prior to this delivery?: No  Feeding Feeding Type: Breast Fed Length of feed: 15 min  LATCH Score                   Interventions    Lactation Tools Discussed/Used Pump Review: Setup, frequency, and cleaning;Milk Storage Initiated by:: LM Date initiated:: 01/14/17   Consult Status Consult Status: Follow-up Date: 01/14/17 Follow-up type: In-patient    Huston FoleyMOULDEN, Lariyah Shetterly S 01/13/2017, 10:24 AM

## 2017-01-13 NOTE — Anesthesia Postprocedure Evaluation (Signed)
Anesthesia Post Note  Patient: Angela [name]  Procedure(s) Performed: AN AD HOC LABOR EPIDURAL     Patient location during evaluation: Mother Baby Anesthesia Type: Epidural Level of consciousness: awake and alert, oriented and patient cooperative Pain management: pain level controlled Vital Signs Assessment: post-procedure vital signs reviewed and stable Respiratory status: spontaneous breathing Cardiovascular status: stable Postop Assessment: no headache, epidural receding, patient able to bend at knees and no signs of nausea or vomiting Anesthetic complications: no Comments: Pain score 2.    Last Vitals:  Vitals:   01/12/17 1851 01/12/17 1954  BP: (!) 153/88 (!) 141/71  Pulse: 74 75  Resp: 18 18  Temp: (!) 38.3 C 37.3 C  SpO2: 100% 99%    Last Pain:  Vitals:   01/13/17 0543  TempSrc:   PainSc: 0-No pain   Pain Goal: Patients Stated Pain Goal: 3 (01/11/17 1555)               Merrilyn PumaWRINKLE,[name]

## 2017-01-14 ENCOUNTER — Ambulatory Visit: Payer: Self-pay

## 2017-01-14 ENCOUNTER — Other Ambulatory Visit: Payer: Self-pay

## 2017-01-14 MED ORDER — HYDROCHLOROTHIAZIDE 12.5 MG PO TABS: 25.0000 mg | ORAL_TABLET | Freq: Every day | ORAL | 0 refills | Status: DC

## 2017-01-14 MED ORDER — HYDROCHLOROTHIAZIDE 25 MG PO TABS: 25.0000 mg | ORAL_TABLET | Freq: Every day | ORAL | 0 refills | Status: DC

## 2017-01-14 NOTE — Lactation Note (Addendum)
This note was copied from a baby's chart. Lactation Consultation Note  Patient Name: Angela [name] Today's Date: 01/14/2017   Mom was found to be using a size 16 nipple shield, which became too small for her once infant applied suction.  A size 20 nipple shield was applied & Mom felt much better.  I assisted Mom & her family with supplementing infant at the breast. She took a total of 20mL of formula with ease.  Mom reports that she is pumping q2-3hrs.   Mom has my # to call for assist w/next feeding.  Mom is on Zoloft 50mg  qd (L2) and has rec'd a prescription for HCTZ 25mg  qd (L2).  Lurline HareRichey, Jamin Panther [facility] 01/14/2017, 4:41 PM

## 2017-01-14 NOTE — Lactation Note (Addendum)
This note was copied from a baby's chart. Lactation Consultation Note Baby sleepy, hasn't been cluster feeding for long periods as should be at hours of age. Baby slightly jaundice, checking bili serum. Noted slight jittery. Mom stated she noted that a few hours ago. Notified RN. RN ordered blood glucose w/bili serum. Came back 47. At hours of age, LC thinks needs to be over 6445. Encouraged mom to notify RN if notices changes or increase in jitteriness, or to sleepy. Baby BF w/#16 NS. Mom demonstrated application. Mom had twisted, demonstrated correct application. Hand expression of 1 ml colostrum, w/slight blood tinge at end of collection. Baby BF needed stimulation. No colostrum noted in NS.  Called to have baby weight early. 9% weight loss noted. Discussed supplementing. Mom appreciative d/t felt like baby needed more substance. Neosure 22 cal given. SNS attempted, then reviewed 5 fr tubing and syring feeding taught. Baby full off of SNS. Mom encouraged to use DEBP, Hasn't worn shells, encouraged to wear.encouraged hand expression.  Supplementation information sheet reviewed according to hours of age. Supplementation of colostrum to be given first, then formula. Formula preparation reviewed.  Mom needed reminded frequently of firming breast up w/NS while BF. Reported mom RN of consult.  Encouraged to call for assistance or questions. Discussed mature milk verses colostrum, and transitional milk.   Patient Name: Angela Caroleen HammanMagan Hartsock UXLKG'MToday's Date: 01/14/2017 Reason for consult: Follow-up assessment;Mother's request   Maternal Data    Feeding Feeding Type: Breast Fed Length of feed: 20 min  LATCH Score Latch: Repeated attempts needed to sustain latch, nipple held in mouth throughout feeding, stimulation needed to elicit sucking reflex.  Audible Swallowing: A few with stimulation(no colostrum seen in NS)  Type of Nipple: Everted at rest and after stimulation(short shaft)  Comfort  (Breast/Nipple): Soft / non-tender  Hold (Positioning): Full assist, staff holds infant at breast  LATCH Score: 6  Interventions Interventions: Breast feeding basics reviewed;Breast compression;Assisted with latch;Adjust position;Skin to skin;Support pillows;Breast massage;Position options;DEBP;Hand express  Lactation Tools Discussed/Used Tools: Nipple Shields Nipple shield size: 20   Consult Status Consult Status: Follow-up Date: 01/14/17 Follow-up type: In-patient    Angela DancerCARVER, Wilver Tignor G 01/14/2017, 2:33 AM

## 2017-01-14 NOTE — Discharge Instructions (Signed)
Breast Pumping Tips  If you are breastfeeding, there may be times when you cannot feed your baby directly. Returning to work or going on a trip are examples. Pumping allows you to store breast milk and feed it to your baby later.  You may not get much milk when you first start to pump. Your breasts should start to make more after a few days. If you pump at the times you usually feed your baby, you may be able to keep making enough milk to feed your baby without also using formula. The more often you pump, the more milk your body will make.  When should I pump?  · You can start to pump soon after you have your baby. Ask your doctor what is right for you and your baby.  · If you are going back to work, start pumping a few weeks before. This gives you time to learn how to pump and to store a supply of milk.  · When you are with your baby, feed your baby when he or she is hungry. Pump after each feeding.  · When you are away from your baby for many hours, pump for about 15 minutes every 2-3 hours. Pump both breasts at the same time if you can.  · If your baby has a formula feeding, make sure to pump close to the same time.  · If you drink any alcohol, wait 2 hours before pumping.  How do I get ready to pump?  Your let-down reflex is your body's natural reaction that makes your breast milk flow. It is easier to make your breast milk flow when you are relaxed. Try these things to help you relax:  · Smell one of your baby's blankets or an item of clothing.  · Look at a picture or video of your baby.  · Sit in a quiet, private space.  · Massage the breast you plan to pump.  · Place soothing warmth on the breast.  · Play relaxing music.    What are some breast pumping tips?  · Wash your hands before you pump. You do not need to wash your nipples or breasts.  · There are three ways to pump. You can:  ? Use your hand to massage and squeeze your breast.  ? Use a handheld manual pump.  ? Use an electric pump.  · Make sure the  suction cup on the breast pump is the right size. Place the suction cup directly over the nipple. It can be painful or hurt your nipple if it is the wrong size or placed wrong.  · Put a small amount of purified or modified lanolin on your nipple and areola if you are sore.  · If you are using an electric pump, change the speed and suction power to be more comfortable.  · You may need a different type of pump if pumping hurts or you do not get a lot of milk. Your doctor can help you pick what type of pump to use.  · Keep a full water bottle near you always. Drinking lots of fluid helps you make more milk.  · You can store your milk to use later. Pumped breast milk can be stored in a sealable, sterile container or plastic bag. Always put the date you pumped it on the container.  ? Milk can stay out at room temperature for up to 8 hours.  ? You can store your milk in the refrigerator for up to   8 days.  ? You can store your milk in the freezer for 3 months. Thaw frozen milk using warm water. Do not put it in the microwave.  · Do not smoke. Ask your doctor for help.  When should I call my doctor?  · You have a hard time pumping.  · You are worried you do not make enough milk.  · You have nipple pain, soreness, or redness.  · You want to take birth control pills.  This information is not intended to replace advice given to you by your health care provider. Make sure you discuss any questions you have with your health care provider.  Document Released: 07/20/2007 Document Revised: 07/09/2015 Document Reviewed: 11/23/2012  Elsevier Interactive Patient Education © 2017 [employer]

## 2017-01-14 NOTE — Discharge Summary (Signed)
OB Discharge Summary     Patient Name: Angela [name] DOB: 28-Apr-1985 MRN: [id number]  Date of admission: 01/11/2017 Delivering MD: Rolm BookbinderMOSS, AMBER   Date of discharge: 01/14/2017  Admitting diagnosis: INDUCTION Intrauterine pregnancy: [redacted]w[redacted]d     Secondary diagnosis:  Active Problems:   Term pregnancy   SVD (spontaneous vaginal delivery)  Additional problems: Cholestasis of pregnancy, triple I, h/o MDD and anxiety      Discharge diagnosis: Term Pregnancy Delivered                                                                                                Post partum procedures:IV antibiotics   Augmentation: AROM, Pitocin, Cytotec and Foley Balloon  Complications: Intrauterine Inflammation or infection (Chorioamniotis)  Hospital course:  Induction of Labor With Vaginal Delivery   31 y.o. yo G2P1011 at 602w2d was admitted to the hospital 01/11/2017 for induction of labor.  Indication for induction: Cholestasis of pregnancy.  Patient had an uncomplicated labor course as follows: Membrane Rupture Time/Date: 1:40 PM ,01/12/2017   Intrapartum Procedures: Episiotomy: None [1]                                         Lacerations:  1st degree [2];Periurethral [8];Vaginal [6]  Patient had delivery of a Viable infant.  Information for the patient's newborn:  [name], Girl Margo AyeMagan [[id number]][[id number]]      01/12/2017  Details of delivery can be found in separate delivery note.  Patient had a routine postpartum course. Patient is discharged home 01/14/17.  Physical exam  Vitals:   01/13/17 1230 01/13/17 1630 01/13/17 1802 01/14/17 0608  BP: 125/76 (!) 116/55 131/78 124/86  Pulse: 87 79 87 81  Resp: 20 18 18 18   Temp: 99 F (37.2 C) 97.9 F (36.6 C) 98.2 F (36.8 C)   TempSrc: Oral Oral Oral   SpO2:      Weight:      Height:       General: alert, cooperative and no distress Lochia: appropriate Uterine Fundus: firm Incision: N/A DVT Evaluation: No evidence of DVT seen on physical  exam. Calf/Ankle edema is present Labs: Lab Results  Component Value Date   WBC 10.6 (H) 01/13/2017   HGB 10.9 (L) 01/13/2017   HCT 34.7 (L) 01/13/2017   MCV 86.8 01/13/2017   PLT 124 (L) 01/13/2017   CMP Latest Ref Rng & Units 01/10/2017  Glucose 65 - 99 mg/dL 92  BUN 6 - 20 mg/dL 9  Creatinine 4.010.57 - 0.271.00 mg/dL 2.530.63  Sodium 664134 - 403144 mmol/L 140  Potassium 3.5 - 5.2 mmol/L 3.9  Chloride 96 - 106 mmol/L 104  CO2 20 - 29 mmol/L 21  Calcium 8.7 - 10.2 mg/dL 8.8  Total Protein 6.0 - 8.5 g/dL 4.7(Q5.8(L)  Total Bilirubin 0.0 - 1.2 mg/dL <2.5<0.2  Alkaline Phos 39 - 117 IU/L 311(H)  AST 0 - 40 IU/L 22  ALT 0 - 32 IU/L 8    Discharge instruction: per After Visit Summary and "  Baby and Me Booklet".  After visit meds:  Allergies as of 01/14/2017   No Known Allergies     Medication List    STOP taking these medications   aspirin EC 81 MG tablet   ondansetron 4 MG disintegrating tablet Commonly known as:  ZOFRAN ODT     TAKE these medications   ferrous sulfate 325 (65 FE) MG tablet Commonly known as:  FERROUSUL Take 1 tablet (325 mg total) by mouth 2 (two) times daily.   fluticasone 50 MCG/ACT nasal spray Commonly known as:  FLONASE Place 2 sprays into both nostrils daily.   hydrochlorothiazide 25 MG tablet Commonly known as:  HYDRODIURIL Take 1 tablet (25 mg total) by mouth daily.   omeprazole 20 MG capsule Commonly known as:  PRILOSEC Take 1 capsule (20 mg total) by mouth daily.   prenatal multivitamin Tabs tablet Take 1 tablet by mouth daily at 12 noon.   sertraline 50 MG tablet Commonly known as:  ZOLOFT TAKE 1 TABLET BY MOUTH ONCE DAILY.       Diet: routine diet  Activity: Advance as tolerated. Pelvic rest for 6 weeks.   Outpatient follow up:4 weeks Follow up Appt: Future Appointments  Date Time Provider Department Center  02/10/2017  9:30 AM [place] BingPickens, Charlie, MD [facility]   Follow up Visit:No Follow-up on file.  Postpartum contraception:  Progesterone only pills  Newborn Data: Live born female  Birth Weight: 6 lb 6.8 oz (2914 g) APGAR: 8, 9  Newborn Delivery   Birth date/time:  01/12/2017 17:15:00 Delivery type:  Vaginal, Spontaneous     Baby Feeding: Breast Disposition:rooming in   01/14/2017 Sharyon CableVeronica C Kimberl Vig, CNM

## 2017-01-14 NOTE — Lactation Note (Signed)
This note was copied from a baby's chart. Lactation Consultation Note F/u w/mom to see about feeding. Baby 37 2/7 weeks. Mom stated baby has short feedings. Longest 15 min. Others 5-10 min. encouraged STS, breast massage during feedings. Discussed at this time baby needs to be cluster feeding 20-30 min. Feedings every hour. Encouraged to wake baby if hasn't BF in 3 hrs. Mom states baby has out put. Asked mom to call for assistance in latching if needed, and to stimulate baby to feed if not interested in BF. LC to F/U. Patient Name: Angela Caroleen HammanMagan [name] WUJWJ'XToday's Date: 01/14/2017 Reason for consult: Follow-up assessment   Maternal Data    Feeding Feeding Type: Breast Fed  LATCH Score Latch: Repeated attempts needed to sustain latch, nipple held in mouth throughout feeding, stimulation needed to elicit sucking reflex.  Audible Swallowing: A few with stimulation  Type of Nipple: Everted at rest and after stimulation  Comfort (Breast/Nipple): Soft / non-tender  Hold (Positioning): Assistance needed to correctly position infant at breast and maintain latch.  LATCH Score: 7  Interventions    Lactation Tools Discussed/Used Tools: Nipple Dorris CarnesShields   Consult Status Consult Status: Follow-up Date: 01/14/17 Follow-up type: In-patient    Annaleise Burger, Diamond NickelLAURA G 01/14/2017, 1:42 AM

## 2017-01-14 NOTE — Lactation Note (Signed)
This note was copied from a baby's chart. Lactation Consultation Note LEAD explained to mom, formula preparation in rm. BM storage, amount to be given according to hours of age. Information sheet given on baby's less than 6lbs, not to stress during feeding not over 30 min, keep hat on head, as much STS as possible, strict I&O stressed. Mom is to give colostrum 1st then subtract colostrum amount from amount needed to equal amount of formula needed for supplementing. Mom stated she understands. Patient Name: Angela [name] AOZHY'QToday's Date: 01/14/2017 Reason for consult: Follow-up assessment;Mother's request   Maternal Data    Feeding Feeding Type: Formula Length of feed: 20 min  LATCH Score Latch: Repeated attempts needed to sustain latch, nipple held in mouth throughout feeding, stimulation needed to elicit sucking reflex.  Audible Swallowing: A few with stimulation(no colostrum seen in NS)  Type of Nipple: Everted at rest and after stimulation(short shaft)  Comfort (Breast/Nipple): Soft / non-tender  Hold (Positioning): Full assist, staff holds infant at breast  LATCH Score: 6  Interventions Interventions: Breast feeding basics reviewed;Breast compression;Assisted with latch;Adjust position;Skin to skin;Support pillows;Breast massage;Position options;DEBP;Hand express;Shells;Pre-pump if needed  Lactation Tools Discussed/Used Tools: Nipple Shields;Pump;Shells;Supplemental Nutrition System;16F feeding tube / Syringe Nipple shield size: 20;16 Shell Type: Inverted Breast pump type: Double-Electric Breast Pump   Consult Status Consult Status: Follow-up Date: 01/14/17 Follow-up type: In-patient    Charyl DancerCARVER, Randale Carvalho G 01/14/2017, 6:14 AM

## 2017-01-15 ENCOUNTER — Ambulatory Visit: Payer: Self-pay

## 2017-01-15 NOTE — Lactation Note (Signed)
This note was copied from a baby's chart. Lactation Consultation Note  Patient Name: Girl Caroleen HammanMagan Krouse JYNWG'NToday's Date: 01/15/2017 Reason for consult: Follow-up assessment;1st time breastfeeding;Difficult latch;Early term 37-38.6wks  Follow up visit at 66 hours of age.  Mom reports using SNS and NS mom reports having all supplies she needs, although LC provided mom with additional 355fr feeding tube.  Baby had weight gain in past 24 hours with RN LATCH score of "9."  Mom is preparing supplies for discharge with family members in room.  Much activity at this time and mom does not appear focused on breastfeeding related concerns at this time.  LC briefly reviewed engorgement.  Mom to soften breast as needed prior to latch. Mom aware of o/p services. Parents want to follow up with LC o/p on Dec. 5th at 10:00 am. [facility] to follow up with parents.       Maternal Data Has patient been taught Hand Expression?: Yes Does the patient have breastfeeding experience prior to this delivery?: No  Feeding Feeding Type: Breast Fed Length of feed: 10 min  LATCH Score                   Interventions    Lactation Tools Discussed/Used     Consult Status Consult Status: Follow-up Date: 01/18/17(want 10:00 am appointment ) Follow-up type: Out-patient    Franz DellJana Keeli Roberg 01/15/2017, 11:31 AM

## 2017-01-18 ENCOUNTER — Encounter: Payer: BLUE CROSS/BLUE SHIELD | Admitting: Obstetrics & Gynecology

## 2017-01-19 ENCOUNTER — Encounter (INDEPENDENT_AMBULATORY_CARE_PROVIDER_SITE_OTHER): Payer: Self-pay | Admitting: *Deleted

## 2017-01-19 DIAGNOSIS — Z029 Encounter for administrative examinations, unspecified: Secondary | ICD-10-CM

## 2017-01-21 ENCOUNTER — Emergency Department (HOSPITAL_COMMUNITY)
Admission: EM | Admit: 2017-01-21 | Discharge: 2017-01-21 | Disposition: A | Discharge status: Home / Self Care | Payer: BLUE CROSS/BLUE SHIELD | Attending: Emergency Medicine | Admitting: Emergency Medicine

## 2017-01-21 ENCOUNTER — Encounter (HOSPITAL_COMMUNITY): Payer: Self-pay | Admitting: Emergency Medicine

## 2017-01-21 DIAGNOSIS — N898 Other specified noninflammatory disorders of vagina: Secondary | ICD-10-CM | POA: Diagnosis not present

## 2017-01-21 DIAGNOSIS — Z87891 Personal history of nicotine dependence: Secondary | ICD-10-CM | POA: Diagnosis not present

## 2017-01-21 DIAGNOSIS — R52 Pain, unspecified: Secondary | ICD-10-CM

## 2017-01-21 DIAGNOSIS — O9089 Other complications of the puerperium, not elsewhere classified: Secondary | ICD-10-CM | POA: Insufficient documentation

## 2017-01-21 DIAGNOSIS — Z79899 Other long term (current) drug therapy: Secondary | ICD-10-CM | POA: Diagnosis not present

## 2017-01-21 LAB — URINALYSIS, ROUTINE W REFLEX MICROSCOPIC
Bacteria, UA: NONE SEEN
Bilirubin Urine: NEGATIVE
GLUCOSE, UA: NEGATIVE mg/dL
KETONES UR: NEGATIVE mg/dL
NITRITE: NEGATIVE
PROTEIN: NEGATIVE mg/dL
Specific Gravity, Urine: 1.013 (ref 1.005–1.030)
pH: 6 (ref 5.0–8.0)

## 2017-01-21 LAB — WET PREP, GENITAL
Sperm: NONE SEEN
Trich, Wet Prep: NONE SEEN
Yeast Wet Prep HPF POC: NONE SEEN

## 2017-01-21 MED ORDER — ACETAMINOPHEN 325 MG PO TABS
650.0000 mg | ORAL_TABLET | Freq: Once | ORAL | Status: AC
  Administered 2017-01-21: 650 mg via ORAL
  Filled 2017-01-21: qty 2

## 2017-01-21 MED ORDER — ACETAMINOPHEN 325 MG PO TABS
ORAL_TABLET | ORAL | Status: AC
  Filled 2017-01-21: qty 2

## 2017-01-21 NOTE — Discharge Instructions (Signed)
Ice packs on/off.  Try alternating tylenol and ibuprofen every 4 and 6 hrs for pain.  Call your doctor on Monday to arrange a follow-up appt. If needed.  Return to ER for any worsening symptoms

## 2017-01-21 NOTE — ED Triage Notes (Signed)
Pt C/O of vaginal pain with "some white discharge noted." Pt gave birth 01/12/17.

## 2017-01-22 NOTE — ED Provider Notes (Signed)
[name] Provider Note   CSN: [id number] Arrival date & time: 01/21/17  2027     History   Chief Complaint Chief Complaint  Patient presents with  . Vaginal Pain    HPI Angela [name] is a 31 y.o. female.  HPI   Angela [name] is a 31 y.o. female 1 week postpartum, is who presents to the Emergency Department complaining of external vaginal pain and pressure.  Symptoms have been present since delivery.  She also reports a vaginal bleeding which has been minimal, but was told at time of discharge that this may occur.  She states that she became concerned with her vaginal pain earlier due to noticing some "white discharge" and burning with urination . she describes her vaginal pain as a fullness and pressure that is worse upon standing and states the pain seems to be focused mainly on her left labia.  She has been taking ibuprofen for her symptoms which provides some relief.  She denies abdominal pain, fever, weakness, dizziness, vomiting, chills, and back pain.  She states that she contacted the on-call nurse and was advised to go to urgent care which was closed so she came to the emergency department for evaluation.  She denies complication with delivery    Past Medical History:  Diagnosis Date  . Bilateral carpal tunnel syndrome 11/18/2015  . Depression with anxiety 05/07/2014  . GERD (gastroesophageal reflux disease)   . IBS (irritable bowel syndrome)   . Migraines   . Missed abortion 03/26/2014  . Multiple thyroid nodules   . Painful menstrual periods 11/13/2014  . Raynaud disease   . Raynaud's disease     Patient Active Problem List   Diagnosis Date Noted  . SVD (spontaneous vaginal delivery) 01/12/2017  . Cholestasis of pregnancy in third trimester 01/11/2017  . Term pregnancy 01/11/2017  . Gastroesophageal reflux disease without esophagitis 10/07/2016  . Epistaxis 07/21/2016  . Supervision of other normal pregnancy, antepartum 06/14/2016  .  Bilateral carpal tunnel syndrome 11/18/2015  . IBS (irritable bowel syndrome) 05/07/2014  . Raynaud disease 05/07/2014  . Depression with anxiety 05/07/2014  . Smoker 05/07/2014    Past Surgical History:  Procedure Laterality Date  . COLONOSCOPY  11/03/11   [place]:colonic mucosa appeared normal  . DG SELECTED HS[facility] ONLY    . WISDOM TOOTH EXTRACTION      OB History    Gravida Para Term Preterm AB Living   2 1 1  0 1 1   SAB TAB Ectopic Multiple Live Births   1 0 0 0 1       Home Medications    Prior to Admission medications   Medication Sig Start Date End Date Taking? Authorizing Provider  ferrous sulfate (FERROUSUL) 325 (65 FE) MG tablet Take 1 tablet (325 mg total) by mouth 2 (two) times daily. 11/09/16   Allie Bossierove, Myra C, MD  fluticasone (FLONASE) 50 MCG/ACT nasal spray Place 2 sprays into both nostrils daily.    [provider]  hydrochlorothiazide (HYDRODIURIL) 12.5 MG tablet Take 2 tablets (25 mg total) by mouth daily for 5 days. 01/14/17 01/19/17  Sharyon Cableogers, Veronica C, CNM  omeprazole (PRILOSEC) 20 MG capsule Take 1 capsule (20 mg total) by mouth daily. 10/07/16   Tilda BurrowFerguson, John V, MD  Prenatal Vit-Fe Fumarate-FA (PRENATAL MULTIVITAMIN) TABS tablet Take 1 tablet by mouth daily at 12 noon.    [provider]  sertraline (ZOLOFT) 50 MG tablet TAKE 1 TABLET BY MOUTH ONCE DAILY.  12/08/16   Dorcas CarrowJohnson, [name], DO    Family History Family History  Problem Relation Age of Onset  . Diabetes Father   . Heart disease Father   . Alcohol abuse Father   . Hypertension Father   . Rheum arthritis Mother   . Arthritis Mother        rheumatoid  . Diabetes Paternal Grandfather   . Congestive Heart Failure Paternal Grandfather   . Alzheimer's disease Paternal Grandmother   . Other Maternal Grandmother        obstruction in stomach  . Stroke Maternal Grandfather   . Diabetes Maternal Grandfather   . Diabetes Sister   . Hypertension Sister   . Hypertension Brother     . Colon cancer Neg Hx   . Esophageal cancer Neg Hx   . Rectal cancer Neg Hx   . Stomach cancer Neg Hx     Social History Social History   Tobacco Use  . Smoking status: Former Smoker    Packs/day: 0.25    Years: 10.00    Pack years: 2.50    Types: Cigarettes    Last attempt to quit: 04/14/2016    Years since quitting: 0.7  . Smokeless tobacco: Never Used  . Tobacco comment: 5 cigs per day  Substance Use Topics  . Alcohol use: No    Comment: occ wine  . Drug use: No     Allergies   Patient has no known allergies.   Review of Systems Review of Systems  Constitutional: Negative for activity change, appetite change, chills and fever.  Respiratory: Negative for chest tightness and shortness of breath.   Cardiovascular: Negative for chest pain.  Gastrointestinal: Negative for abdominal pain, nausea and vomiting.  Genitourinary: Positive for dysuria, vaginal bleeding and vaginal pain. Negative for decreased urine volume, difficulty urinating, flank pain, frequency, hematuria and urgency.  Musculoskeletal: Negative for back pain and myalgias.  Skin: Negative for rash.  Neurological: Negative for dizziness, weakness and numbness.  Hematological: Negative for adenopathy.  Psychiatric/Behavioral: Negative for confusion.  All other systems reviewed and are negative.    Physical Exam Updated Vital Signs BP 123/77 (BP Location: Right Arm)   Pulse 81   Temp 98.6 F (37 C) (Oral)   Resp 19   Ht 5\' 2"  (1.575 m)   Wt 67.8 kg (149 lb 8 oz)   SpO2 97%   BMI 27.34 kg/m   Physical Exam  Constitutional: She is oriented to person, place, and time. She appears well-developed and well-nourished. No distress.  HENT:  Head: Atraumatic.  Mouth/Throat: Oropharynx is clear and moist.  Neck: Normal range of motion.  Cardiovascular: Normal rate, regular rhythm and intact distal pulses.  Pulmonary/Chest: Effort normal and breath sounds normal. No respiratory distress. She exhibits no  tenderness.  Abdominal: Soft. She exhibits no distension and no mass. There is no tenderness. There is no guarding.  Genitourinary: There is tenderness and lesion on the left labia.  Genitourinary Comments: Mild dark vaginal blood present in the vault.  Mild edema and superficial appearing ulceration to the left labia.  Bimanual exam deferred due to level of patient's discomfort.  Musculoskeletal: Normal range of motion.  Lymphadenopathy: No inguinal adenopathy noted on the left side.  Neurological: She is alert and oriented to person, place, and time. No sensory deficit.  Skin: Skin is warm. No rash noted.  Psychiatric: She has a normal mood and affect.  Nursing note and vitals reviewed.    ED Treatments /  Results  Labs (all labs ordered are listed, but only abnormal results are displayed) Labs Reviewed  WET PREP, GENITAL - Abnormal; Notable for the following components:      Result Value   Clue Cells Wet Prep HPF POC PRESENT (*)    WBC, Wet Prep HPF POC MANY (*)    All other components within normal limits  URINALYSIS, ROUTINE W REFLEX MICROSCOPIC - Abnormal; Notable for the following components:   Hgb urine dipstick LARGE (*)    Leukocytes, UA MODERATE (*)    Squamous Epithelial / LPF 0-5 (*)    All other components within normal limits  URINE CULTURE  GC/CHLAMYDIA PROBE AMP () NOT AT [facility]    EKG  EKG Interpretation None       Radiology No results found.  Procedures Procedures (including critical care time)  Medications Ordered in ED Medications  acetaminophen (TYLENOL) tablet 650 mg (650 mg Oral Given 01/21/17 2309)     Initial Impression / Assessment and Plan / ED Course  I have reviewed the triage vital signs and the nursing notes.  Pertinent labs & imaging results that were available during my care of the patient were reviewed by me and considered in my medical decision making (see chart for details).     Patient is well-appearing.  Abdomen is  soft and nontender.  Symptoms are localized to the external vagina.  Cultures are pending.  Patient prefers to take Tylenol along with her ibuprofen for pain control agrees to intermittent cold compresses or ice packs for irritation she denies symptoms other than vaginal pain.  She appears stable for discharge return precautions were discussed.   Final Clinical Impressions(s) / ED Diagnoses   Final diagnoses:  Pain related to vaginal delivery    ED Discharge Orders    None       Pauline Aus, PA-C 01/22/17 1731    Raeford Razor, MD 01/22/17 [phone]

## 2017-01-23 LAB — URINE CULTURE

## 2017-01-24 LAB — GC/CHLAMYDIA PROBE AMP (~~LOC~~) NOT AT ARMC
CHLAMYDIA, DNA PROBE: NEGATIVE
NEISSERIA GONORRHEA: NEGATIVE

## 2017-01-25 ENCOUNTER — Encounter: Payer: Self-pay | Admitting: Radiology

## 2017-01-27 ENCOUNTER — Encounter: Payer: Self-pay | Admitting: Obstetrics & Gynecology

## 2017-01-27 ENCOUNTER — Ambulatory Visit (INDEPENDENT_AMBULATORY_CARE_PROVIDER_SITE_OTHER): Payer: BLUE CROSS/BLUE SHIELD | Admitting: Obstetrics & Gynecology

## 2017-01-27 VITALS — Ht 62.0 in | Wt 146.0 lb

## 2017-01-27 DIAGNOSIS — S3739XS Other injury of urethra, sequela: Principal | ICD-10-CM

## 2017-01-27 DIAGNOSIS — S3733XS Laceration of urethra, sequela: Secondary | ICD-10-CM

## 2017-01-27 MED ORDER — LIDOCAINE HCL 2 % EX GEL: 1.0000 "application " | CUTANEOUS | 5 refills | Status: DC | PRN

## 2017-01-27 NOTE — Patient Instructions (Signed)
Return to clinic for any scheduled appointments or obstetric concerns, or go to [facility] for evaluation  

## 2017-01-27 NOTE — Progress Notes (Signed)
Subjective:     [name] is a 31 y.o. 682P1011 female here today for evaluation of "swollen vagina" .  She is 2 weeks postpartum following a spontaneous vaginal delivery. I have fully reviewed the prenatal and intrapartum course. The delivery was at 7652w2d. Anesthesia: epidural. She had a left periurethral tear that was repaired as per the note.  Patient reports that it burns when she pees and it feels "raw and swollen down there". No suprapubic pain, fevers, back pain or other symptoms.  Baby is doing well; here with her and her husband today.    The following portions of the patient's history were reviewed and updated as appropriate: allergies, current medications, past family history, past medical history, past social history, past surgical history and problem list.  Review of Systems Pertinent items noted in HPI and remainder of comprehensive ROS otherwise negative.   Objective:    Ht 5\' 2"  (1.575 m)   Wt 146 lb (66.2 kg)   Breastfeeding? Yes   BMI 26.70 kg/m   General:  alert and no distress  Abdomen: soft, non-tender; bowel sounds normal; no masses,  no organomegaly   Vulva:  Healing bilateral superficial periurethral lacerations, no erythema, no drainage, no bleeding. Mildly tender to touch.         Assessment and Plan:  Periurethral tear in female, sequela Patient reassured about her lacerations, told that they are healing well. No signs of infection. Recommended and instructed on how to use pericare bottles to direct water stream in the area during urination to wash away urine; or recommended urinating in the shower which also helps.  Lidocaine jelly also prescribed as needed for pain, encouraged to take NSAIDs also.  Warm compresses can also help with the pain (she does not like the cold perineal packs). - lidocaine (XYLOCAINE) 2 % jelly; Apply 1 application topically as needed.  Dispense: 30 mL; Refill: 5    Jaynie CollinsUGONNA  Laderius Valbuena, MD, FACOG Attending Obstetrician &  Gynecologist, Faculty Practice Center for [facility], [facility]

## 2017-02-10 ENCOUNTER — Ambulatory Visit (INDEPENDENT_AMBULATORY_CARE_PROVIDER_SITE_OTHER): Payer: BLUE CROSS/BLUE SHIELD | Admitting: Obstetrics and Gynecology

## 2017-02-10 ENCOUNTER — Encounter: Payer: Self-pay | Admitting: Obstetrics and Gynecology

## 2017-02-10 DIAGNOSIS — N952 Postmenopausal atrophic vaginitis: Secondary | ICD-10-CM

## 2017-02-10 MED ORDER — ESTRADIOL 0.1 MG/GM VA CREA: TOPICAL_CREAM | VAGINAL | 1 refills | Status: DC

## 2017-02-10 NOTE — Progress Notes (Signed)
Obstetrics Visit Postpartum Visit  Appointment Date: 02/10/2017  OB[facility]N Clinic[facility]  Primary Care Provider: Dorcas CarrowJohnson, Angela P  Chief Complaint:  Chief Complaint  Patient presents with  . Postpartum Care    History of Present Illness: [name] is a 31 y.o. Caucasian G2P1011 (No LMP recorded.), seen for the above chief complaint. Her past medical history is significant for IOL for cholestasis, depression/anxiety   She is s/p 11/29 on SVD/periuretheral tear (repaired with 2-0 vicryl) ; she was discharged to home on PPD#2  Vaginal bleeding or discharge: Yes  Breast or formula feeding: breast only, q2-3h Intercourse: No  Contraception after delivery: abstinence PP depression s/s: No  Any bowel or bladder issues: No  Pap smear: no abnormalities (date: 2016)  She states that she has had facial pressure during the pregnancy and continues to have it. No drainage, fevers, chills, cough. Has h/o sinus infections in the past but no surgeries. She also is having some irritation on the left side when she urinates  Review of Systems: as noted in the History of Present Illness.   Medications Angela [name] had no medications administered during this visit. Current Outpatient Medications  Medication Sig Dispense Refill  . Prenatal Vit-Fe Fumarate-FA (PRENATAL MULTIVITAMIN) TABS tablet Take 1 tablet by mouth daily at 12 noon.    . sertraline (ZOLOFT) 50 MG tablet TAKE 1 TABLET BY MOUTH ONCE DAILY. 30 tablet 3  . estradiol (ESTRACE) 0.1 MG/GM vaginal cream Quarter applicator qhs each night x 2wks. Then quarter applicator qhs 2-3x/week 42.5 g 1   No current facility-administered medications for this visit.     Allergies Patient has no known allergies.  Physical Exam:  BP 102/76   Pulse 79   Wt 145 lb (65.8 kg)   Breastfeeding? Yes   BMI 26.52 kg/m  Body mass index is 26.52 kg/m. General appearance: Well nourished, well developed female  in no acute distress.  HEENT: no maxillary, frontal or mastoid tenderness Respiratory:  Clear to auscultation bilateral. Normal respiratory effort Abdomen: positive bowel sounds and no masses, hernias; diffusely non tender to palpation, non distended Neuro/Psych:  Normal mood and affect.  Skin:  Warm and dry.  Lymphatic:  No inguinal lymphadenopathy.   Pelvic exam: is not limited by body habitus EGBUS: within normal limits with mild to moderate atrophy. On the left labia minora at 1 o'clock approx 3cm 2-3cm from the urethra is a stitch with knot still present with 3 hairs that have ingrown. In grown hair ends freed and stitch cut flush with the skin. Silver nitrate applied Vagina: within normal limits except for mild atrophy and with scant old blood in the vault, Cervix:  no lesions or cervical motion tenderness Uterus:  nonenlarged and approximately 8 week sized Adnexa:  normal adnexa and no mass, fullness, tenderness Rectovaginal: deferred  Laboratory: none  PP Depression Screening:  EPDS score 5  Assessment: pt doing well  Plan:  D/w her that s/s likely from chronic irritation from that area trying to heal. Pt also had some discomfort at the introitus with speculum exam; perineal body feels intact, nttp and with good support. She is exclusively breastfeeding and has dryness in the GU area and I told her I feel she'd benefit from some vaginal estrogen which she is amenable to; h/o COC pills in the past and some caused increase in migraines; d/w her re: rare risk with topical use. [facility]; options d/w and can f/u in  one month. Recommend pelvic rest until then and sitz baths  No s/s of sinus infection. Recommend pcp eval to see if needs work up  RTC 1 month for f/u pelvic exam  Angela [name], Angela HagemanJr [name] Attending Center for [facility](Faculty Practice)

## 2017-02-17 ENCOUNTER — Ambulatory Visit: Payer: BLUE CROSS/BLUE SHIELD | Admitting: Obstetrics and Gynecology

## 2017-03-15 ENCOUNTER — Encounter: Payer: Self-pay | Admitting: Obstetrics and Gynecology

## 2017-03-15 ENCOUNTER — Ambulatory Visit (INDEPENDENT_AMBULATORY_CARE_PROVIDER_SITE_OTHER): Payer: BLUE CROSS/BLUE SHIELD | Admitting: Obstetrics and Gynecology

## 2017-03-15 VITALS — BP 117/77 | Wt 145.2 lb

## 2017-03-15 DIAGNOSIS — Z30011 Encounter for initial prescription of contraceptive pills: Secondary | ICD-10-CM | POA: Diagnosis not present

## 2017-03-15 DIAGNOSIS — N393 Stress incontinence (female) (male): Secondary | ICD-10-CM | POA: Insufficient documentation

## 2017-03-15 MED ORDER — NORETHINDRONE 0.35 MG PO TABS: 1.0000 | ORAL_TABLET | Freq: Every day | ORAL | 3 refills | Status: DC

## 2017-03-15 NOTE — Progress Notes (Signed)
Obstetrics and Gynecology Visit Return Patient Evaluation  Appointment Date: 03/15/2017  Primary Care Provider: Dorcas CarrowJohnson, [name]  Referring Provider: Dorcas CarrowJohnson, [name], DO  Chief Complaint: f/u pelvic exam. SUI  History of Present Illness:  Angela [name] is a [age] y.o. G2P1 here for above CC. PMHx significant for ICP and depression/anxiety. Pt had some vulvar irritation and had a stitch and ingrown there. See prior note  Pt states she feels well and healed there; she didn't do the vaginal estrogen. No AUB or menses yet. She is breastfeeding only and goes about 4-5h in between night feeds. She states she does note some SUI; she also notes that when she has to to go, she it hits suddenly and has to go. Doesn't sound like she's had accidents and and doesn't have any fecal incontinence issues, OAB s/s or nocturia and no lower urinary tract s/s. Last intercourse 2879m ago.   Review of Systems: as noted in the History of Present Illness.  The following portions of the patient's history were reviewed and updated as appropriate: allergies, current medications, past family history, past medical history, past social history, past surgical history and problem list.  Patient Active Problem List   Diagnosis Date Noted  . Stress incontinence of urine 03/15/2017  . Gastroesophageal reflux disease without esophagitis 10/07/2016  . Epistaxis 07/21/2016  . Bilateral carpal tunnel syndrome 11/18/2015  . IBS (irritable bowel syndrome) 05/07/2014  . Raynaud disease 05/07/2014  . Depression with anxiety 05/07/2014  . Smoker 05/07/2014   Medications and Allergies: reviewed  Physical Exam:  BP 117/77   Wt 145 lb 3.2 oz (65.9 kg)   BMI 26.56 kg/m  Body mass index is 26.56 kg/m. General appearance: Well nourished, well developed female in no acute distress.  Pelvic: EGBUS normal, no issues.  Neuro/Psych:  Normal mood and affect.     Assessment: pt doing well.   Plan:  1. Stress incontinence of  urine D/w her re: most women will have this resolve by about 3479m s/p vag delivery and can just keep an eye on it, do timed voiding to avoid having a full bladder and can try some at home kegel exercises or can see PT now and she'd like to do the latter. [facility] Ambulatory referral to Physical Therapy  2. Postpartum D/w her re: options and would like to do camila. Instructions on use given.   RTC: PRN  Cornelia Copaharlie Kenia Teagarden, Jr MD Attending Center for [facility](Faculty Practice)

## 2017-03-22 ENCOUNTER — Ambulatory Visit: Payer: BLUE CROSS/BLUE SHIELD

## 2017-03-27 ENCOUNTER — Other Ambulatory Visit: Payer: Self-pay | Admitting: Family Medicine

## 2017-03-30 ENCOUNTER — Ambulatory Visit: Payer: BLUE CROSS/BLUE SHIELD | Admitting: Family Medicine

## 2017-03-30 ENCOUNTER — Encounter: Payer: Self-pay | Admitting: Family Medicine

## 2017-04-11 ENCOUNTER — Encounter: Payer: Self-pay | Admitting: Family Medicine

## 2017-04-11 ENCOUNTER — Ambulatory Visit: Payer: BLUE CROSS/BLUE SHIELD | Admitting: Family Medicine

## 2017-04-11 VITALS — BP 102/68 | HR 67 | Temp 99.1°F | Wt 142.3 lb

## 2017-04-11 DIAGNOSIS — H6983 Other specified disorders of Eustachian tube, bilateral: Secondary | ICD-10-CM

## 2017-04-11 DIAGNOSIS — R040 Epistaxis: Secondary | ICD-10-CM

## 2017-04-11 DIAGNOSIS — R42 Dizziness and giddiness: Secondary | ICD-10-CM

## 2017-04-11 MED ORDER — PREDNISONE 50 MG PO TABS: 50.0000 mg | ORAL_TABLET | Freq: Every day | ORAL | 0 refills | Status: DC

## 2017-04-11 NOTE — Patient Instructions (Signed)
AYR GEL

## 2017-04-11 NOTE — Progress Notes (Signed)
BP 102/68 (BP Location: Left Arm, Patient Position: Sitting, Cuff Size: Normal)   Pulse 67   Temp 99.1 F (37.3 C)   Wt 142 lb 5 oz (64.6 kg)   SpO2 96%   BMI 26.03 kg/m    Subjective:    Patient ID: [name], female    DOB: [birth year], 32 y.o.   MRN: [id number]  HPI: [name] is a 32 y.o. female  Chief Complaint  Patient presents with  . Dizziness    sinus issues since she was pregnant with nose bleeds, she states that the nose bleeds have gotten better.   DIZZINESS Duration: Congestion and nosebleeds: The whole time she was pregnant and now since she delivered in November, for 2 weeks Description of symptoms: lightheaded Duration of episode: minutes Dizziness frequency: recurrent, several times a day Provoking factors: none Aggravating factors:  none Triggered by rolling over in bed: no Triggered by bending over: no Aggravated by head movement: no Aggravated by exertion, coughing, loud noises: no Recent head injury: no Recent or current viral symptoms: yes History of vasovagal episodes: no Nausea: yes Vomiting: no Tinnitus: no Hearing loss: no Aural fullness: yes Headache: yes Photophobia/phonophobia: no Unsteady gait: no Postural instability: no Diplopia, dysarthria, dysphagia or weakness: no Related to exertion: no Pallor: no Diaphoresis: no Dyspnea: no Chest pain: no   Relevant past medical, surgical, family and social history reviewed and updated as indicated. Interim medical history since our last visit reviewed. Allergies and medications reviewed and updated.  Review of Systems  Constitutional: Negative.   HENT: Positive for congestion, nosebleeds, postnasal drip and sinus pressure. Negative for dental problem, drooling, ear discharge, ear pain, facial swelling, hearing loss, mouth sores, rhinorrhea, sinus pain, sneezing, sore throat, tinnitus, trouble swallowing and voice change.   Eyes: Negative.   Respiratory: Negative.     Cardiovascular: Negative.   Musculoskeletal: Negative.   Neurological: Positive for dizziness, light-headedness and headaches. Negative for tremors, seizures, syncope, facial asymmetry, speech difficulty, weakness and numbness.  Psychiatric/Behavioral: Negative.     Per HPI unless specifically indicated above     Objective:    BP 102/68 (BP Location: Left Arm, Patient Position: Sitting, Cuff Size: Normal)   Pulse 67   Temp 99.1 F (37.3 C)   Wt 142 lb 5 oz (64.6 kg)   SpO2 96%   BMI 26.03 kg/m   Wt Readings from Last 3 Encounters:  04/11/17 142 lb 5 oz (64.6 kg)  03/15/17 145 lb 3.2 oz (65.9 kg)  02/10/17 145 lb (65.8 kg)    Orthostatic VS for the past 24 hrs:  BP- Lying Pulse- Lying BP- Sitting Pulse- Sitting BP- Standing at 0 minutes Pulse- Standing at 0 minutes  04/11/17 1327 110/69 68 108/71 88 101/68 83   Physical Exam  Constitutional: She is oriented to person, place, and time. She appears well-developed and well-nourished. No distress.  HENT:  Head: Normocephalic and atraumatic.  Right Ear: Hearing, external ear and ear canal normal. A middle ear effusion is present.  Left Ear: Hearing, external ear and ear canal normal. A middle ear effusion is present.  Nose: Mucosal edema, rhinorrhea and nose lacerations present. No epistaxis.  Mouth/Throat: Oropharynx is clear and moist and mucous membranes are normal. No oropharyngeal exudate.  Eyes: Conjunctivae, EOM and lids are normal. Pupils are equal, round, and reactive to light. Right eye exhibits no discharge. Left eye exhibits no discharge. No scleral icterus.  Neck: Normal range of motion. Neck supple. No  JVD present. No tracheal deviation present. No thyromegaly present.  Cardiovascular: Normal rate, regular rhythm, normal heart sounds and intact distal pulses. Exam reveals no gallop and no friction rub.  No murmur heard. Pulmonary/Chest: Effort normal and breath sounds normal. No stridor. No respiratory distress. She  has no wheezes. She has no rales. She exhibits no tenderness.  Musculoskeletal: Normal range of motion.  Lymphadenopathy:    She has no cervical adenopathy.  Neurological: She is alert and oriented to person, place, and time.  Skin: Skin is warm, dry and intact. No rash noted. She is not diaphoretic. No erythema. No pallor.  Psychiatric: She has a normal mood and affect. Her speech is normal and behavior is normal. Judgment and thought content normal. Cognition and memory are normal.  Nursing note and vitals reviewed.   Results for orders placed or performed during the hospital encounter of 01/21/17  Wet prep, genital  Result Value Ref Range   Yeast Wet Prep HPF POC NONE SEEN NONE SEEN   Trich, Wet Prep NONE SEEN NONE SEEN   Clue Cells Wet Prep HPF POC PRESENT (A) NONE SEEN   WBC, Wet Prep HPF POC MANY (A) NONE SEEN   Sperm NONE SEEN   Urine culture  Result Value Ref Range   Specimen Description URINE, CLEAN CATCH    Special Requests NONE    Culture (A)     <10,000 COLONIES/mL INSIGNIFICANT GROWTH Performed at [facility], [address] [place], [place] [address]    Report Status 01/23/2017 FINAL   Urinalysis, Routine w reflex microscopic  Result Value Ref Range   Color, Urine YELLOW YELLOW   APPearance CLEAR CLEAR   Specific Gravity, Urine 1.013 1.005 - 1.030   pH 6.0 5.0 - 8.0   Glucose, UA NEGATIVE NEGATIVE mg/dL   Hgb urine dipstick LARGE (A) NEGATIVE   Bilirubin Urine NEGATIVE NEGATIVE   Ketones, ur NEGATIVE NEGATIVE mg/dL   Protein, ur NEGATIVE NEGATIVE mg/dL   Nitrite NEGATIVE NEGATIVE   Leukocytes, UA MODERATE (A) NEGATIVE   RBC / HPF 6-30 0 - 5 RBC/hpf   WBC, UA 6-30 0 - 5 WBC/hpf   Bacteria, UA NONE SEEN NONE SEEN   Squamous Epithelial / LPF 0-5 (A) NONE SEEN   Mucus PRESENT   GC/Chlamydia probe amp  Result Value Ref Range   Chlamydia Negative    Neisseria gonorrhea Negative       Assessment & Plan:   Problem List Items Addressed This Visit        Other   Epistaxis    Other Visit Diagnoses    Dizziness    -  Primary   Of unclear etiology. Will check labs and treat congestion with prednisone. Call if not getting better or getting worse. Continue to monitor.    Relevant Orders   CBC with Differential/Platelet   Comprehensive metabolic panel   TSH   Dysfunction of both eustachian tubes       ?Causing dizziness. Will treat with prednisone burst and recheck by phone on Monday. Call with any concerns.        Follow up plan: Return if symptoms worsen or fail to improve.

## 2017-04-12 ENCOUNTER — Ambulatory Visit: Payer: BLUE CROSS/BLUE SHIELD

## 2017-04-12 LAB — CBC WITH DIFFERENTIAL/PLATELET
BASOS: 0 %
Basophils Absolute: 0 10*3/uL (ref 0.0–0.2)
EOS (ABSOLUTE): 0 10*3/uL (ref 0.0–0.4)
EOS: 1 %
HEMATOCRIT: 38.2 % (ref 34.0–46.6)
HEMOGLOBIN: 12.3 g/dL (ref 11.1–15.9)
IMMATURE GRANS (ABS): 0 10*3/uL (ref 0.0–0.1)
IMMATURE GRANULOCYTES: 0 %
LYMPHS: 44 %
Lymphocytes Absolute: 1.5 10*3/uL (ref 0.7–3.1)
MCH: 26.6 pg (ref 26.6–33.0)
MCHC: 32.2 g/dL (ref 31.5–35.7)
MCV: 83 fL (ref 79–97)
MONOCYTES: 7 %
Monocytes Absolute: 0.2 10*3/uL (ref 0.1–0.9)
NEUTROS PCT: 48 %
Neutrophils Absolute: 1.6 10*3/uL (ref 1.4–7.0)
Platelets: 220 10*3/uL (ref 150–379)
RBC: 4.63 x10E6/uL (ref 3.77–5.28)
RDW: 13.8 % (ref 12.3–15.4)
WBC: 3.4 10*3/uL (ref 3.4–10.8)

## 2017-04-12 LAB — COMPREHENSIVE METABOLIC PANEL
ALBUMIN: 4.7 g/dL (ref 3.5–5.5)
ALT: 17 IU/L (ref 0–32)
AST: 16 IU/L (ref 0–40)
Albumin/Globulin Ratio: 1.7 (ref 1.2–2.2)
Alkaline Phosphatase: 72 IU/L (ref 39–117)
BUN/Creatinine Ratio: 14 (ref 9–23)
BUN: 9 mg/dL (ref 6–20)
Bilirubin Total: 0.3 mg/dL (ref 0.0–1.2)
CALCIUM: 9.8 mg/dL (ref 8.7–10.2)
CO2: 25 mmol/L (ref 20–29)
CREATININE: 0.66 mg/dL (ref 0.57–1.00)
Chloride: 97 mmol/L (ref 96–106)
GFR, EST AFRICAN AMERICAN: 137 mL/min/{1.73_m2} (ref >59)
GFR, EST NON AFRICAN AMERICAN: 119 mL/min/{1.73_m2} (ref >59)
GLOBULIN, TOTAL: 2.7 g/dL (ref 1.5–4.5)
Glucose: 84 mg/dL (ref 65–99)
Potassium: 3.8 mmol/L (ref 3.5–5.2)
SODIUM: 138 mmol/L (ref 134–144)
TOTAL PROTEIN: 7.4 g/dL (ref 6.0–8.5)

## 2017-04-12 LAB — TSH: TSH: 1.99 u[IU]/mL (ref 0.450–4.500)

## 2017-04-17 ENCOUNTER — Telehealth: Payer: Self-pay | Admitting: Family Medicine

## 2017-04-17 NOTE — Telephone Encounter (Signed)
She notes that she has not been feeling dizzy any more, but is still having a little sinus headache. In general feeling much better.

## 2017-04-17 NOTE — Telephone Encounter (Signed)
-----   Message from Dorcas CarrowMegan P Joyia Riehle, DO sent at 04/11/2017  1:45 PM EST ----- Call about her dizziness

## 2017-04-19 ENCOUNTER — Encounter: Payer: Self-pay | Admitting: Family Medicine

## 2017-05-03 ENCOUNTER — Ambulatory Visit: Payer: Self-pay | Admitting: *Deleted

## 2017-05-03 NOTE — Telephone Encounter (Signed)
This is not an high priority message. Please get her scheduled for an appointment.

## 2017-05-03 NOTE — Telephone Encounter (Signed)
Pt   Seen   By  Dr    Laural BenesJohnson  3   Weeks    Ago Sinus Problems   Pt  Took  Course  Of  Prednisone    Still  Using   Air  Mist   Nasal   Spray  Pt   reports  Is  Not  Dizzy  Any more   Now  She  Reports  Facial  Pain  Forehead  And  Eyes   Pain  Scale  Of  3-4   Pt  Reports  Mild  Nosebleeds    Nasal  Congestion    These  Symptoms   Started  2  Days ago   Pt  Is  Breast  Feeding   -  Not  Taking   Any  Sinus  Med   Now   Please  Advise        [phone]

## 2017-05-04 NOTE — Telephone Encounter (Signed)
Left a message for patient to call to schedule appointment.

## 2017-05-05 ENCOUNTER — Telehealth: Payer: Self-pay | Admitting: *Deleted

## 2017-05-05 DIAGNOSIS — N644 Mastodynia: Secondary | ICD-10-CM | POA: Diagnosis not present

## 2017-05-05 NOTE — Telephone Encounter (Signed)
Pt called in stating she has right breast pain that started last night. She states that warm compresses/shower is not helping and breast feels like a 'water balloon'. She denies fever, redness, and rash on breast.  Advised pt to empty breast q2h whether by feeding or pumping, alternate cool/warm compresses, take NSAIDS around the clock, and go to urgent care to be evaluated. Pt expressed understanding.

## 2017-06-01 ENCOUNTER — Encounter: Payer: Self-pay | Admitting: Family Medicine

## 2017-06-01 ENCOUNTER — Ambulatory Visit: Payer: BLUE CROSS/BLUE SHIELD | Admitting: Family Medicine

## 2017-06-01 VITALS — BP 105/72 | HR 81 | Temp 98.2°F | Wt 140.1 lb

## 2017-06-01 DIAGNOSIS — J0100 Acute maxillary sinusitis, unspecified: Secondary | ICD-10-CM

## 2017-06-01 MED ORDER — AMOXICILLIN-POT CLAVULANATE 875-125 MG PO TABS: 1.0000 | ORAL_TABLET | Freq: Two times a day (BID) | ORAL | 0 refills | Status: DC

## 2017-06-01 NOTE — Progress Notes (Signed)
BP 105/72 (BP Location: Left Arm, Patient Position: Sitting, Cuff Size: Normal)   Pulse 81   Temp 98.2 F (36.8 C) (Oral)   Wt 140 lb 2 oz (63.6 kg)   SpO2 98%   BMI 25.63 kg/m    Subjective:    Patient ID: Angela [name], female    DOB: 04-23-1985, 32 y.o.   MRN: [id number]  HPI: Angela [name] is a 32 y.o. female  Chief Complaint  Patient presents with  . Sinusitis    congestion, running nose, fatigue, dizziness and headache.   UPPER RESPIRATORY TRACT INFECTION Duration: 2 weeks Worst symptom: congestion, headache Fever: no Cough: yes Shortness of breath: no Wheezing: no Chest pain: no Chest tightness: no Chest congestion: no Nasal congestion: yes Runny nose: yes Post nasal drip: yes Sneezing: yes Sore throat: yes Swollen glands: no Sinus pressure: yes Headache: yes Face pain: yes Toothache: no Ear pain: no  Ear pressure: no  Eyes red/itching:no Eye drainage/crusting: no  Vomiting: no Rash: no Fatigue: yes Sick contacts: yes Strep contacts: no  Context: worse Recurrent sinusitis: no Relief with OTC cold/cough medications: no  Treatments attempted: none   Relevant past medical, surgical, family and social history reviewed and updated as indicated. Interim medical history since our last visit reviewed. Allergies and medications reviewed and updated.  Review of Systems  Constitutional: Negative.   HENT: Positive for congestion, postnasal drip, rhinorrhea, sinus pressure, sinus pain, sneezing and sore throat. Negative for dental problem, drooling, ear discharge, ear pain, facial swelling, hearing loss, mouth sores, nosebleeds, tinnitus, trouble swallowing and voice change.   Respiratory: Positive for cough, shortness of breath and wheezing. Negative for apnea, choking, chest tightness and stridor.   Cardiovascular: Negative.   Psychiatric/Behavioral: Negative.     Per HPI unless specifically indicated above     Objective:    BP 105/72 (BP  Location: Left Arm, Patient Position: Sitting, Cuff Size: Normal)   Pulse 81   Temp 98.2 F (36.8 C) (Oral)   Wt 140 lb 2 oz (63.6 kg)   SpO2 98%   BMI 25.63 kg/m   Wt Readings from Last 3 Encounters:  06/01/17 140 lb 2 oz (63.6 kg)  04/11/17 142 lb 5 oz (64.6 kg)  03/15/17 145 lb 3.2 oz (65.9 kg)    Physical Exam  Constitutional: She is oriented to person, place, and time. She appears well-developed and well-nourished. No distress.  HENT:  Head: Normocephalic and atraumatic.  Right Ear: Hearing, tympanic membrane, external ear and ear canal normal.  Left Ear: Hearing, tympanic membrane, external ear and ear canal normal.  Nose: Mucosal edema and rhinorrhea present. Right sinus exhibits maxillary sinus tenderness. Left sinus exhibits maxillary sinus tenderness.  Mouth/Throat: Uvula is midline, oropharynx is clear and moist and mucous membranes are normal. No oropharyngeal exudate.  Eyes: Pupils are equal, round, and reactive to light. Conjunctivae, EOM and lids are normal. Right eye exhibits no discharge. Left eye exhibits no discharge. No scleral icterus.  Neck: Normal range of motion. Neck supple. No JVD present. No tracheal deviation present. No thyromegaly present.  Cardiovascular: Normal rate, regular rhythm, normal heart sounds and intact distal pulses. Exam reveals no gallop and no friction rub.  No murmur heard. Pulmonary/Chest: Effort normal and breath sounds normal. No stridor. No respiratory distress. She has no wheezes. She has no rales. She exhibits no tenderness.  Musculoskeletal: Normal range of motion.  Lymphadenopathy:    She has no cervical adenopathy.  Neurological: She is  alert and oriented to person, place, and time.  Skin: Skin is warm, dry and intact. Capillary refill takes less than 2 seconds. No rash noted. She is not diaphoretic. No erythema. No pallor.  Psychiatric: She has a normal mood and affect. Her speech is normal and behavior is normal. Judgment and  thought content normal. Cognition and memory are normal.  Nursing note and vitals reviewed.   Results for orders placed or performed in visit on 04/11/17  CBC with Differential/Platelet  Result Value Ref Range   WBC 3.4 3.4 - 10.8 x10E3/uL   RBC 4.63 3.77 - 5.28 x10E6/uL   Hemoglobin 12.3 11.1 - 15.9 g/dL   Hematocrit 16.138.2 09.634.0 - 46.6 %   MCV 83 79 - 97 fL   MCH 26.6 26.6 - 33.0 pg   MCHC 32.2 31.5 - 35.7 g/dL   RDW 04.513.8 40.912.3 - 81.115.4 %   Platelets 220 150 - 379 x10E3/uL   Neutrophils 48 Not Estab. %   Lymphs 44 Not Estab. %   Monocytes 7 Not Estab. %   Eos 1 Not Estab. %   Basos 0 Not Estab. %   Neutrophils Absolute 1.6 1.4 - 7.0 x10E3/uL   Lymphocytes Absolute 1.5 0.7 - 3.1 x10E3/uL   Monocytes Absolute 0.2 0.1 - 0.9 x10E3/uL   EOS (ABSOLUTE) 0.0 0.0 - 0.4 x10E3/uL   Basophils Absolute 0.0 0.0 - 0.2 x10E3/uL   Immature Granulocytes 0 Not Estab. %   Immature Grans (Abs) 0.0 0.0 - 0.1 x10E3/uL  Comprehensive metabolic panel  Result Value Ref Range   Glucose 84 65 - 99 mg/dL   BUN 9 6 - 20 mg/dL   Creatinine, Ser 9.140.66 0.57 - 1.00 mg/dL   GFR calc non Af Amer 119 >59 mL/min/1.73   GFR calc Af Amer 137 >59 mL/min/1.73   BUN/Creatinine Ratio 14 9 - 23   Sodium 138 134 - 144 mmol/L   Potassium 3.8 3.5 - 5.2 mmol/L   Chloride 97 96 - 106 mmol/L   CO2 25 20 - 29 mmol/L   Calcium 9.8 8.7 - 10.2 mg/dL   Total Protein 7.4 6.0 - 8.5 g/dL   Albumin 4.7 3.5 - 5.5 g/dL   Globulin, Total 2.7 1.5 - 4.5 g/dL   Albumin/Globulin Ratio 1.7 1.2 - 2.2   Bilirubin Total 0.3 0.0 - 1.2 mg/dL   Alkaline Phosphatase 72 39 - 117 IU/L   AST 16 0 - 40 IU/L   ALT 17 0 - 32 IU/L  TSH  Result Value Ref Range   TSH 1.990 0.450 - 4.500 uIU/mL      Assessment & Plan:   Problem List Items Addressed This Visit    None    Visit Diagnoses    Acute maxillary sinusitis, recurrence not specified    -  Primary   Will treat with augmentin. Start antihistamine during allergy season. Call if not getting  better or getting worse   Relevant Medications   amoxicillin-clavulanate (AUGMENTIN) 875-125 MG tablet       Follow up plan: Return if symptoms worsen or fail to improve.

## 2017-07-05 ENCOUNTER — Ambulatory Visit (INDEPENDENT_AMBULATORY_CARE_PROVIDER_SITE_OTHER): Payer: BLUE CROSS/BLUE SHIELD

## 2017-07-05 VITALS — BP 108/74 | HR 67 | Ht 61.0 in | Wt 137.0 lb

## 2017-07-05 DIAGNOSIS — Z1151 Encounter for screening for human papillomavirus (HPV): Secondary | ICD-10-CM | POA: Diagnosis not present

## 2017-07-05 DIAGNOSIS — Z01419 Encounter for gynecological examination (general) (routine) without abnormal findings: Secondary | ICD-10-CM

## 2017-07-05 DIAGNOSIS — Z124 Encounter for screening for malignant neoplasm of cervix: Secondary | ICD-10-CM | POA: Diagnosis not present

## 2017-07-05 NOTE — Progress Notes (Signed)
Last pap 2016 - Had left periurethral laceration during delivery. Approximately 4 weeks later she came in for vaginal pain and a stitch was removed from laceration.

## 2017-07-05 NOTE — Progress Notes (Signed)
GYNECOLOGY ANNUAL PREVENTATIVE CARE ENCOUNTER NOTE  Subjective:   Angela [name] is a 32 y.o. G6P1011 female here for a routine annual gynecologic exam.  Current complaints: pain with intercourse. She states she is having difficulty starting intercourse but feels like it gets better after she starts. She also states she feels like her labia did not heal properly. Denies abnormal vaginal bleeding, discharge, pelvic pain, problems with intercourse or other gynecologic concerns.    Gynecologic History No LMP recorded.- Recent pregnancy Contraception: oral progesterone-only contraceptive Last Pap: 2016. Results were: normal  Obstetric History OB History  Gravida Para Term Preterm AB Living  0 1 1  SAB TAB Ectopic Multiple Live Births  1 0 0 0 1    # Outcome Date GA Lbr Len/2nd Weight Sex Delivery Anes PTL Lv  2 Term 01/12/17 [redacted]w[redacted]d 30:31 / 00:44 6 lb 6.8 oz (2.914 kg) F Vag-Spont EPI  LIV     Birth Comments: n/a  1 SAB 03/26/14 [redacted]w[redacted]d           Past Medical History:  Diagnosis Date  . Bilateral carpal tunnel syndrome 11/18/2015  . Cholestasis of pregnancy in third trimester 01/11/2017  . Depression with anxiety 05/07/2014  . GERD (gastroesophageal reflux disease)   . IBS (irritable bowel syndrome)   . Migraines   . Missed abortion 03/26/2014  . Multiple thyroid nodules   . Painful menstrual periods 11/13/2014  . Raynaud disease   . Raynaud's disease     Past Surgical History:  Procedure Laterality Date  . COLONOSCOPY  11/03/11   [place]:colonic mucosa appeared normal  . DG SELECTED HS[facility] ONLY    . WISDOM TOOTH EXTRACTION      Current Outpatient Medications on File Prior to Visit  Medication Sig Dispense Refill  . amoxicillin-clavulanate (AUGMENTIN) 875-125 MG tablet Take 1 tablet by mouth 2 (two) times daily. 20 tablet 0  . norethindrone (CAMILA) 0.35 MG tablet Take 1 tablet (0.35 mg total) by mouth daily. 3 Package 3  . Prenatal Vit-Fe Fumarate-FA (PRENATAL  MULTIVITAMIN) TABS tablet Take 1 tablet by mouth daily at 12 noon.    . sertraline (ZOLOFT) 50 MG tablet TAKE 1 TABLET BY MOUTH ONCE DAILY. 30 tablet 6   No current facility-administered medications on file prior to visit.     No Known Allergies  Social History   Socioeconomic History  . Marital status: Single    Spouse name: Not on file  . Number of children: Not on file  . Years of education: Not on file  . Highest education level: Not on file  Occupational History  . Occupation: Charity fundraiser  . Financial resource strain: Not on file  . Food insecurity:    Worry: Not on file    Inability: Not on file  . Transportation needs:    Medical: Not on file    Non-medical: Not on file  Tobacco Use  . Smoking status: Former Smoker    Packs/day: 0.25    Years: 10.00    Pack years: 2.50    Types: Cigarettes    Last attempt to quit: 04/14/2016    Years since quitting: 1.2  . Smokeless tobacco: Never Used  . Tobacco comment: 5 cigs per day  Substance and Sexual Activity  . Alcohol use: No    Comment: occ wine  . Drug use: No  . Sexual activity: Yes    Birth control/protection: None  Lifestyle  . Physical  activity:    Days per week: Not on file    Minutes per session: Not on file  . Stress: Not on file  Relationships  . Social connections:    Talks on phone: Not on file    Gets together: Not on file    Attends religious service: Not on file    Active member of club or organization: Not on file    Attends meetings of clubs or organizations: Not on file    Relationship status: Not on file  . Intimate partner violence:    Fear of current or ex partner: Not on file    Emotionally abused: Not on file    Physically abused: Not on file    Forced sexual activity: Not on file  Other Topics Concern  . Not on file  Social History Narrative  . Not on file    Family History  Problem Relation Age of Onset  . Diabetes Father   . Heart disease Father   . Alcohol  abuse Father   . Hypertension Father   . Rheum arthritis Mother   . Arthritis Mother        rheumatoid  . Diabetes Paternal Grandfather   . Congestive Heart Failure Paternal Grandfather   . Alzheimer's disease Paternal Grandmother   . Other Maternal Grandmother        obstruction in stomach  . Stroke Maternal Grandfather   . Diabetes Maternal Grandfather   . Diabetes Sister   . Hypertension Sister   . Hypertension Brother   . Colon cancer Neg Hx   . Esophageal cancer Neg Hx   . Rectal cancer Neg Hx   . Stomach cancer Neg Hx    The following portions of the patient's history were reviewed and updated as appropriate: allergies, current medications, past family history, past medical history, past social history, past surgical history and problem list.  Review of Systems Pertinent items noted in HPI and remainder of comprehensive ROS otherwise negative.   Objective:  BP 108/74   Pulse 67   Wt 137 lb (62.1 kg)   BMI 25.06 kg/m  CONSTITUTIONAL: Well-developed, well-nourished female in no acute distress.  HENT:  Normocephalic, atraumatic, External right and left ear normal. Oropharynx is clear and moist EYES: Conjunctivae and EOM are normal. Pupils are equal, round, and reactive to light. No scleral icterus.  NECK: Normal range of motion, supple, no masses.  Normal thyroid.  SKIN: Skin is warm and dry. No rash noted. Not diaphoretic. No erythema. No pallor. NEUROLOGIC: Alert and oriented to person, place, and time. Normal reflexes, muscle tone coordination. No cranial nerve deficit noted. PSYCHIATRIC: Normal mood and affect. Normal behavior. Normal judgment and thought content. CARDIOVASCULAR: Normal heart rate noted, regular rhythm RESPIRATORY: Clear to auscultation bilaterally. Effort and breath sounds normal, no problems with respiration noted. BREASTS: Symmetric in size. No masses, skin changes, nipple drainage, or lymphadenopathy. ABDOMEN: Soft, normal bowel sounds, no  distention noted.  No tenderness, rebound or guarding.  PELVIC: Normal appearing external genitalia; Small separation in labia minora near urethra. No erythema, non tender. normal appearing vaginal mucosa and cervix.  No abnormal discharge noted.  Pap smear obtained.  Normal uterine size, no other palpable masses, no uterine or adnexal tenderness. MUSCULOSKELETAL: Normal range of motion. No tenderness.  No cyanosis, clubbing, or edema.  2+ distal pulses.   Assessment and Plan:  1. Well woman exam with routine gynecological exam - Cytology - PAP - Discussed with patient normal healing of  laceration and separation is cosmetic in nature. Provided reassurance of normalcy. Patient desires more children and discussed cosmetic repair at time of next delivery. - Comfort measures for discomfort during sex reviewed. Reassured of normalcy while breastfeeding.   Will follow up results of pap smear and manage accordingly. Routine preventative health maintenance measures emphasized. Please refer to After Visit Summary for other counseling recommendations.   Rolm Bookbinder, CNM 07/05/17 1:46 PM

## 2017-07-07 LAB — CYTOLOGY - PAP
DIAGNOSIS: NEGATIVE
HPV (WINDOPATH): NOT DETECTED

## 2017-08-27 DIAGNOSIS — R0981 Nasal congestion: Secondary | ICD-10-CM | POA: Diagnosis not present

## 2017-08-27 DIAGNOSIS — J019 Acute sinusitis, unspecified: Secondary | ICD-10-CM | POA: Diagnosis not present

## 2017-08-28 ENCOUNTER — Ambulatory Visit: Payer: Self-pay | Admitting: *Deleted

## 2017-08-28 NOTE — Telephone Encounter (Signed)
Patient was [facility] yesterday [facility]- she is under treatment for sinus infection- she started antibiotic treatment yesterday- Amoxicillin and Allegra. Patient started with ear pain and pressure after the visit. She wants to make sure the treatment she is taking is going to be enough. She has no fever and is still breastfeeding.   Reason for Disposition . Earache persists > 1 hour  Answer Assessment - Initial Assessment Questions 1. LOCATION: "Which ear is involved?"       R ear pain 2. SENSATION: "Describe how the ear feels."      Pain and stopped up- feels swollen- hurts near jaw 3. ONSET:  "When did the ear symptoms start?"       Yesterday evening 4. PAIN: "Do you also have an earache?" If so, ask: "How bad is it?" (Scale 1-10; or mild, moderate, severe)     5- more uncomfortable than painful 5. CAUSE: "What do you think is causing the ear congestion?"     Sinus infection- possible fluid  6. URI: "Do you have a runny nose or cough?"      Both- patient is presently started treated for sinus infection 7. NASAL ALLERGIES: "Are there symptoms of hay fever, such as sneezing or a clear nasal discharge?"     no 8. PREGNANCY: "Is there any chance you are pregnant?" "When was your last menstrual period?"     No- LMP- last week  Protocols used: EAR - CONGESTION-A-AH

## 2017-08-28 NOTE — Telephone Encounter (Signed)
Message relayed to patient. Verbalized understanding and denied questions.   

## 2017-08-28 NOTE — Telephone Encounter (Signed)
Patient is calling and requesting to speak to a nurse, she states [facility] a [facility] and now her ear hurts that did not hurt at the time she went so she has questions for a nurse. Please call back.

## 2017-08-28 NOTE — Telephone Encounter (Signed)
OK to take flonase BID as well. Sxs should improve with these medications.

## 2017-09-01 ENCOUNTER — Ambulatory Visit (INDEPENDENT_AMBULATORY_CARE_PROVIDER_SITE_OTHER): Payer: BLUE CROSS/BLUE SHIELD | Admitting: Family Medicine

## 2017-09-01 ENCOUNTER — Encounter: Payer: Self-pay | Admitting: Family Medicine

## 2017-09-01 ENCOUNTER — Telehealth: Payer: Self-pay | Admitting: Family Medicine

## 2017-09-01 VITALS — BP 107/67 | HR 83 | Temp 98.6°F | Wt 129.3 lb

## 2017-09-01 DIAGNOSIS — H66001 Acute suppurative otitis media without spontaneous rupture of ear drum, right ear: Secondary | ICD-10-CM | POA: Diagnosis not present

## 2017-09-01 MED ORDER — DOXYCYCLINE HYCLATE 100 MG PO TABS: 100.0000 mg | ORAL_TABLET | Freq: Two times a day (BID) | ORAL | 0 refills | Status: DC

## 2017-09-01 MED ORDER — PREDNISONE 50 MG PO TABS: 50.0000 mg | ORAL_TABLET | Freq: Every day | ORAL | 0 refills | Status: DC

## 2017-09-01 NOTE — Telephone Encounter (Signed)
Appointment scheduled.

## 2017-09-01 NOTE — Telephone Encounter (Signed)
Copied from CRM [phone]#[id number]. Topic: Quick Communication - See Telephone Encounter >> Sep 01, 2017  9:06 AM Terisa Starraylor, Brittany L wrote: CRM for notification. See Telephone encounter for: 09/01/17.  Patient states she has been taking Flonase for the fluid in her ear and it's not getting better. She is taking amoxicillin and allegra and still does not feel any better for the sinus infection. She said she went to [facility] on Sunday 7/14. She started the amoxicillin that day and still feels fluid in her ears, with a lot of congestion and pressure in her head. Patient would like a call back @ [phone]

## 2017-09-01 NOTE — Telephone Encounter (Signed)
Needs an appointment- can get her referral to ENT if she would like.

## 2017-09-01 NOTE — Progress Notes (Signed)
BP 107/67 (BP Location: Left Arm, Patient Position: Sitting, Cuff Size: Normal)   Pulse 83   Temp 98.6 F (37 C)   Wt 129 lb 5 oz (58.7 kg)   SpO2 99%   BMI 24.43 kg/m    Subjective:    Patient ID: Angela [name], female    DOB: 10/12/1985, 32 y.o.   MRN: [id number]  HPI: Angela [name] is a 32 y.o. female  Chief Complaint  Patient presents with  . URI    patient states that she is not getting any better, her ears are full   EAG CLOGGED- got a cold last week, then got better, went to urgent care and started on amoxicillin about 5 days, no better.  Duration: 1 week Involved ear(s):  "right Sensation of feeling clogged/plugged: yes Decreased/muffled hearing:yes Ear pain: yes Fever: no Otorrhea: no Hearing loss: no Upper respiratory infection symptoms: yes Using Q-Tips: no Status: stable History of cerumenosis: no Treatments attempted: allegra, amoxicillin  Relevant past medical, surgical, family and social history reviewed and updated as indicated. Interim medical history since our last visit reviewed. Allergies and medications reviewed and updated.  Review of Systems  Constitutional: Negative.   HENT: Positive for congestion, ear pain, facial swelling, rhinorrhea, sinus pressure and sinus pain. Negative for dental problem, drooling, ear discharge, hearing loss, mouth sores, nosebleeds, postnasal drip, sneezing, sore throat, tinnitus, trouble swallowing and voice change.   Respiratory: Positive for shortness of breath. Negative for apnea, cough, choking, chest tightness, wheezing and stridor.   Cardiovascular: Negative.   Psychiatric/Behavioral: Negative.     Per HPI unless specifically indicated above     Objective:    BP 107/67 (BP Location: Left Arm, Patient Position: Sitting, Cuff Size: Normal)   Pulse 83   Temp 98.6 F (37 C)   Wt 129 lb 5 oz (58.7 kg)   SpO2 99%   BMI 24.43 kg/m   Wt Readings from Last 3 Encounters:  09/01/17 129 lb 5 oz (58.7  kg)  07/05/17 137 lb (62.1 kg)  06/01/17 140 lb 2 oz (63.6 kg)    Physical Exam  Constitutional: She is oriented to person, place, and time. She appears well-developed and well-nourished. No distress.  HENT:  Head: Normocephalic and atraumatic.  Right Ear: Hearing, external ear and ear canal normal. Tympanic membrane is injected.  Left Ear: Hearing, tympanic membrane, external ear and ear canal normal.  Nose: Nose normal.  Mouth/Throat: Uvula is midline, oropharynx is clear and moist and mucous membranes are normal. No oropharyngeal exudate.  Eyes: Pupils are equal, round, and reactive to light. Conjunctivae, EOM and lids are normal. Right eye exhibits no discharge. Left eye exhibits no discharge. No scleral icterus.  Neck: Normal range of motion. Neck supple. No JVD present. No tracheal deviation present. No thyromegaly present.  Cardiovascular: Normal rate, regular rhythm, normal heart sounds and intact distal pulses. Exam reveals no gallop and no friction rub.  No murmur heard. Pulmonary/Chest: Effort normal and breath sounds normal. No stridor. No respiratory distress. She has no wheezes. She has no rales. She exhibits no tenderness.  Musculoskeletal: Normal range of motion.  Lymphadenopathy:    She has no cervical adenopathy.  Neurological: She is alert and oriented to person, place, and time.  Skin: Skin is warm, dry and intact. Capillary refill takes less than 2 seconds. No rash noted. She is not diaphoretic. No erythema. No pallor.  Psychiatric: She has a normal mood and affect. Her speech is normal  and behavior is normal. Judgment and thought content normal. Cognition and memory are normal.  Nursing note and vitals reviewed.   Results for orders placed or performed in visit on 07/05/17  Cytology - PAP  Result Value Ref Range   Adequacy      Satisfactory for evaluation  endocervical/transformation zone component PRESENT.   Diagnosis      NEGATIVE FOR INTRAEPITHELIAL LESIONS OR  MALIGNANCY.   HPV NOT DETECTED    Material Submitted CervicoVaginal Pap [ThinPrep Imaged]    CYTOLOGY - PAP PAP RESULT       Assessment & Plan:   Problem List Items Addressed This Visit    None    Visit Diagnoses    Non-recurrent acute suppurative otitis media of right ear without spontaneous rupture of tympanic membrane    -  Primary   Will treat with prednisone and change to doxycyline. Call if not getting better or getting worse.    Relevant Medications   amoxicillin (AMOXIL) 875 MG tablet   doxycycline (VIBRA-TABS) 100 MG tablet       Follow up plan: Return if symptoms worsen or fail to improve.

## 2017-09-07 ENCOUNTER — Encounter: Payer: Self-pay | Admitting: Family Medicine

## 2017-09-07 DIAGNOSIS — J209 Acute bronchitis, unspecified: Secondary | ICD-10-CM | POA: Diagnosis not present

## 2017-09-07 DIAGNOSIS — R0789 Other chest pain: Secondary | ICD-10-CM | POA: Diagnosis not present

## 2017-10-05 IMAGING — US US MFM OB COMP +14 WKS
1 series · 13 of 28 positions shown · non-contrast
Comparison: none

[Series 1: us mfm ob comp +14 wks · 13 of 121 slices shown]
[im 5/121]
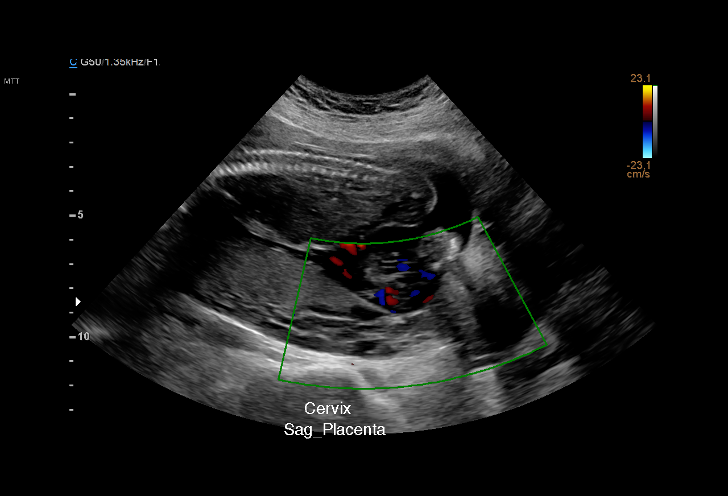
[im 14/121]
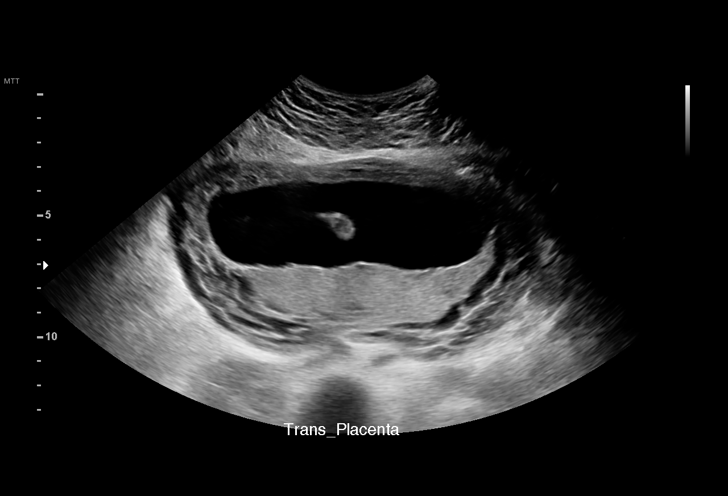
[im 23/121]
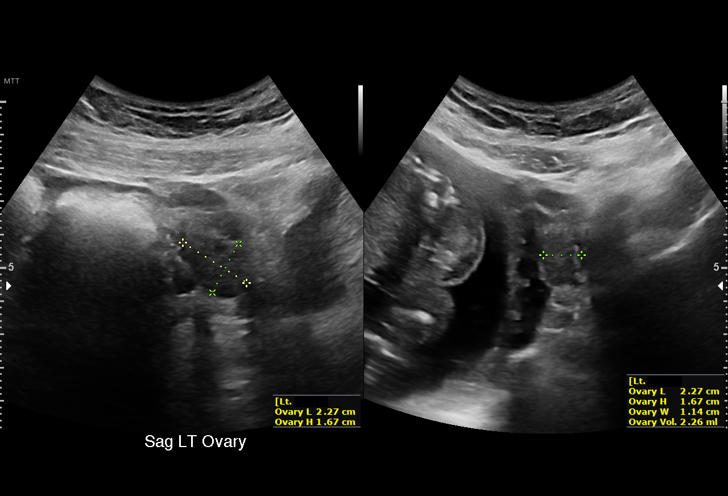
[im 32/121]
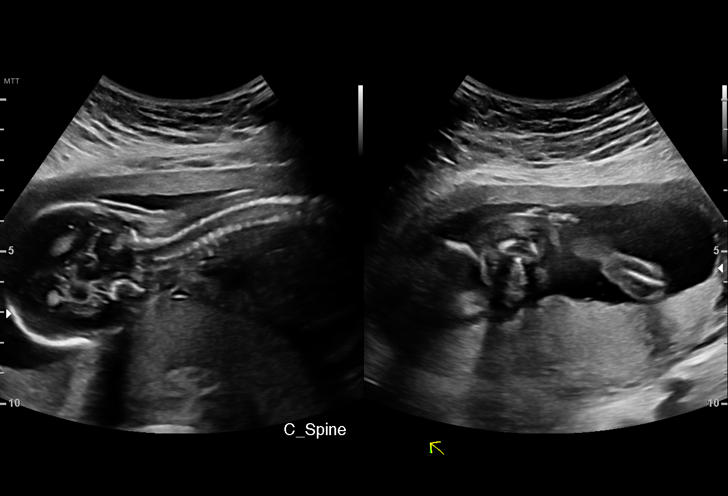
[im 41/121]
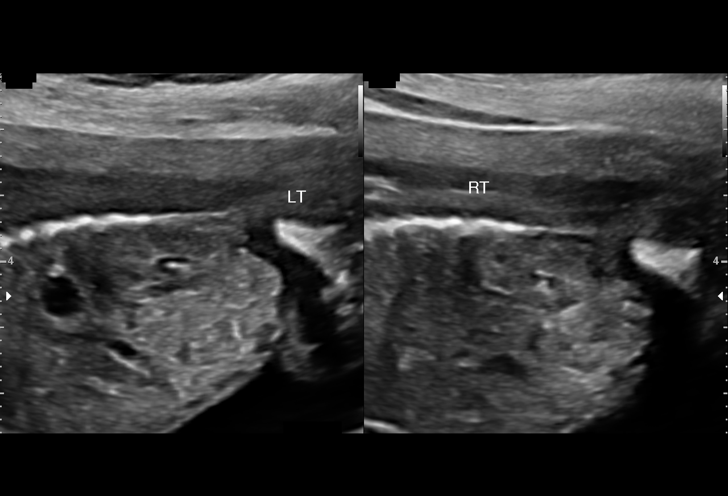
[im 49/121]
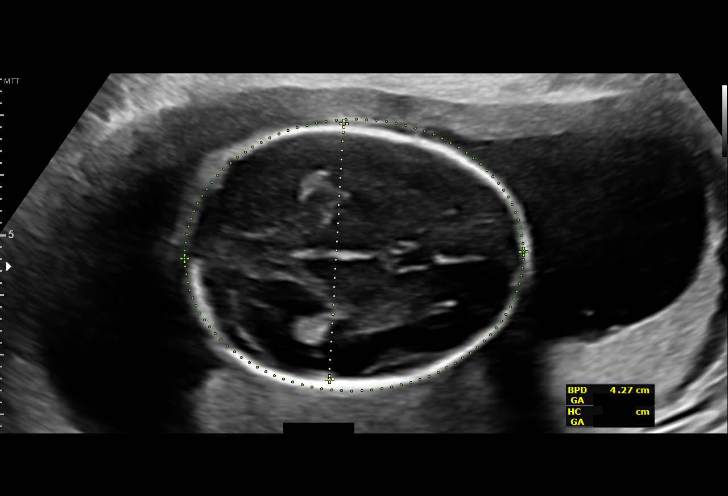
[im 63/121]
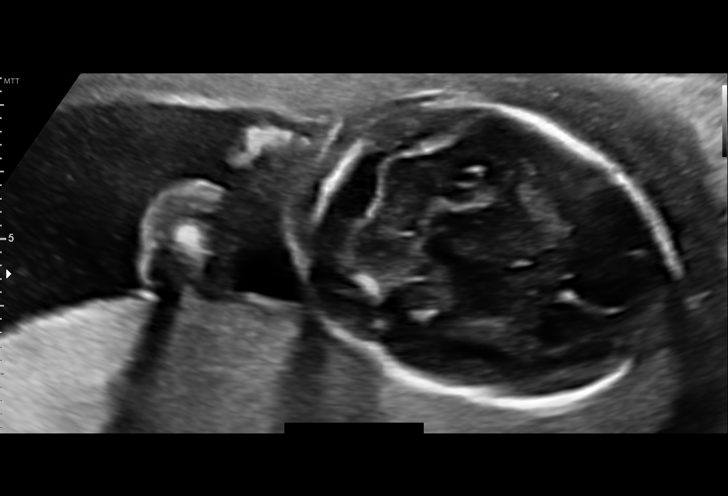
[im 72/121]
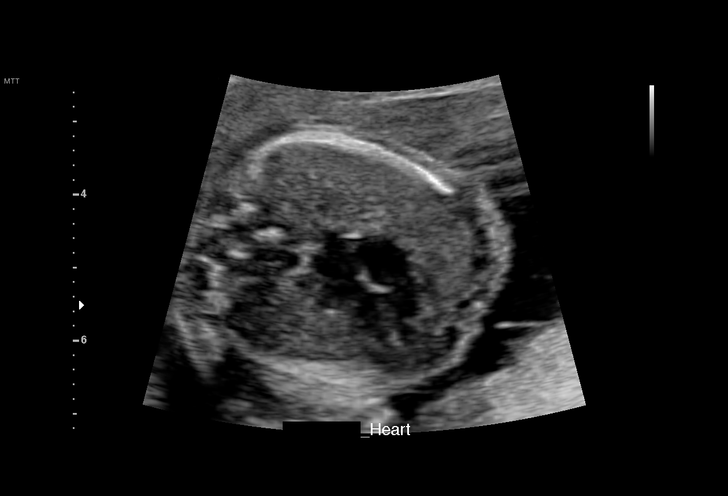
[im 81/121]
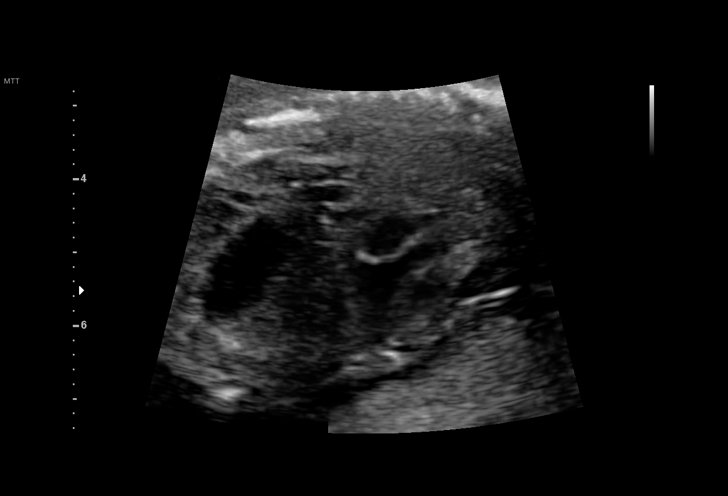
[im 89/121]
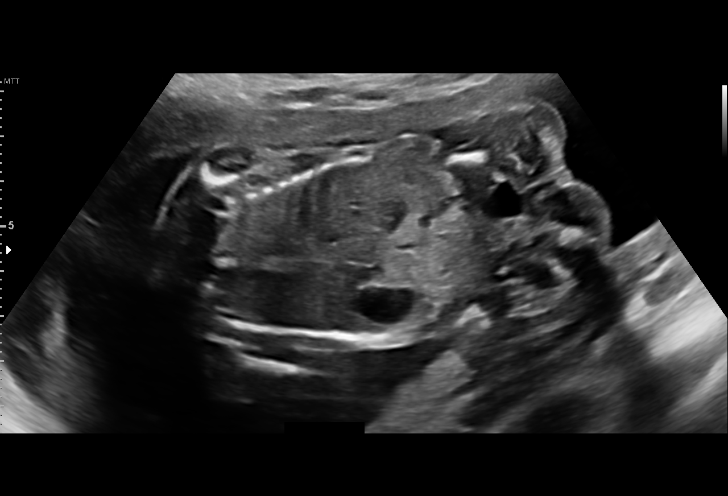
[im 98/121]
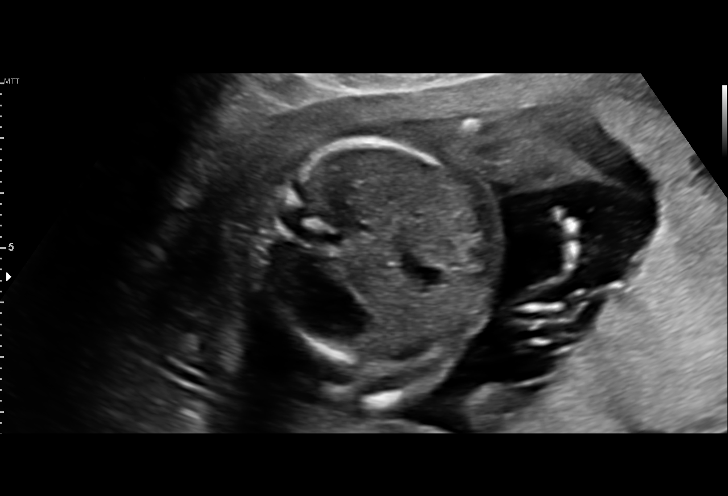
[im 107/121]
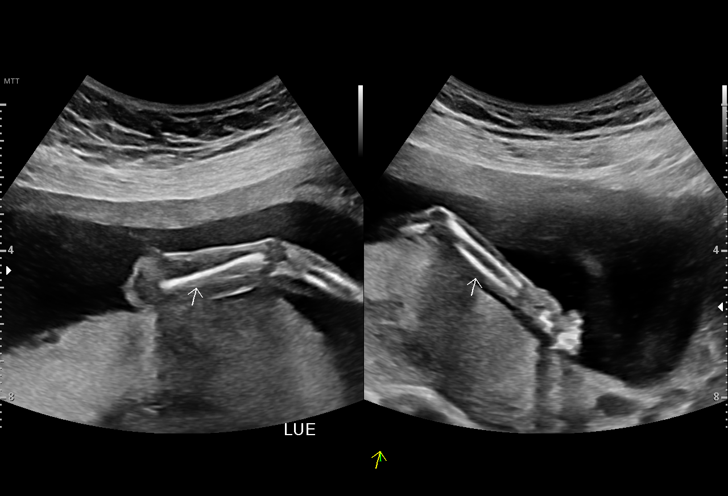
[im 116/121]
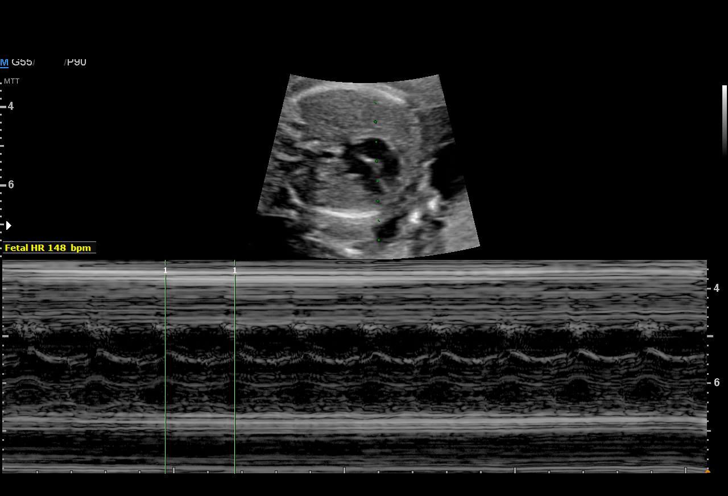

[13 of 28 positions shown; findings below may reference images not displayed]

Ref. Address:     [REDACTED]

1  Elisjan Nazzaro                [id number]      [id number]     [id number]
Indications

19 weeks gestation of pregnancy
Encounter for antenatal screening for
malformations
OB History

Gravidity:    2         Term:   0        Prem:   0        SAB:   1
TOP:          0       Ectopic:  0        Living: 0
Fetal Evaluation

Num Of Fetuses:     1
Fetal Heart         148
Rate(bpm):
Cardiac Activity:   Observed
Presentation:       Breech
Placenta:           Posterior, above cervical os
P. Cord Insertion:  Visualized, central

Amniotic Fluid
AFI FV:      Subjectively within normal limits

Largest Pocket(cm)
4.11
Biometry
BPD:      42.2  mm     G. Age:  18w 5d         41  %    CI:         70.6   %   70 - 86
FL/HC:      18.0   %   16.1 -
HC:      160.1  mm     G. Age:  18w 6d         35  %    HC/AC:      1.11       1.09 -
AC:      143.9  mm     G. Age:  19w 5d         69  %    FL/BPD:     68.2   %
FL:       28.8  mm     G. Age:  18w 6d         38  %    FL/AC:      20.0   %   20 - 24
HUM:      28.2  mm     G. Age:  19w 1d         52  %
CER:      19.4  mm     G. Age:  18w 5d         41  %
NFT:       4.2  mm
CM:        5.7  mm

Est. FW:     282  gm    0 lb 10 oz      50  %
Gestational Age

LMP:           19w 0d       Date:   04/26/16                 EDD:   01/31/17
U/S Today:     19w 0d                                        EDD:   01/31/17
Best:          19w 0d    Det. By:   LMP  (04/26/16)          EDD:   01/31/17
Anatomy

Cranium:               Appears normal         Aortic Arch:            Appears normal
Cavum:                 Appears normal         Ductal Arch:            Not well visualized
Ventricles:            Appears normal         Diaphragm:              Appears normal
Choroid Plexus:        Appears normal         Stomach:                Appears normal, left
sided
Cerebellum:            Appears normal         Abdomen:                Appears normal
Posterior Fossa:       Appears normal         Abdominal Wall:         Appears nml (cord
insert, abd wall)
Nuchal Fold:           Appears normal         Cord Vessels:           Appears normal (3
vessel cord)
Face:                  Orbits nl; profile not Kidneys:                Appear normal
well visualized
Lips:                  Appears normal         Bladder:                Appears normal
Thoracic:              Appears normal         Spine:                  Appears normal
Heart:                 Not well visualized    Upper Extremities:      Appears normal
RVOT:                  Not well visualized    Lower Extremities:      Appears normal
LVOT:                  Appears normal

Other:  Fetus appears to be a female. Heels and 5th digit visualized. Nasal
bone visualized. Technically difficult due to fetal position.
Cervix Uterus Adnexa

Cervix
Length:           3.79  cm.
Normal appearance by transabdominal scan.

Uterus
No abnormality visualized.

Left Ovary
Size(cm)     2.27  x    1.14   x  1.67      Vol(ml):
Within normal limits. No adnexal mass visualized.

Right Ovary
Size(cm)     3.29  x    1.52   x  1.95      Vol(ml):
Within normal limits. No adnexal mass visualized.

Cul De Sac:   No free fluid seen.
Adnexa:       No abnormality visualized.
Impression

Single IUP at 19w 0d
Limited views of the fetal heart obtained
The remainder of the fetal anatomy appears normal
Ultrasound measurements are consistent with LMP
Posterior placenta without previa
Normal amniotic fluid volume
Recommendations

Recommend follow-up ultrasound examination in 4 weeks to
complete anatomy

## 2017-10-17 ENCOUNTER — Encounter: Payer: Self-pay | Admitting: Family Medicine

## 2017-10-17 NOTE — Telephone Encounter (Signed)
Please advise.   Pt was seen here on 09/01/17 for acute issues. Pt did also send in mychart message the day after being treated in the E.R. The questions that the patient is requesting we complete are the following:  Estimate treatment/appointment schedule, in any, over the next 6 months including any recovery period  Estimate the hours the patient needs care on an intermittent basis, if any

## 2017-10-26 NOTE — Telephone Encounter (Signed)
Pt calling.  States she believes this was supposed to be faxed to her yesterday, but she never received anything.  Pt would like a call back at [phone].

## 2017-10-27 ENCOUNTER — Telehealth: Payer: Self-pay | Admitting: Family Medicine

## 2017-10-27 NOTE — Telephone Encounter (Signed)
Routing to provider  

## 2017-10-27 NOTE — Telephone Encounter (Signed)
Called and left a detailed message letting patient know that she can use the patches.

## 2017-10-27 NOTE — Telephone Encounter (Signed)
There is no evidence that they can get into the breast milk. They appear to be fine.

## 2017-10-27 NOTE — Telephone Encounter (Signed)
Copied from CRM [id number]. Topic: General - Other >> Oct 27, 2017 11:18 AM Tamela OddiHarris, Faizon Capozzi J wrote: Reason for CRM: Patient called to ask the doctor or nurse since she has been breastfeeding, she has been having pain in her back.  Patient wanted to know if she could use patches with Lidocaine on her back.  Patient was unsure and wanted to get advice before she uses the patches.  Please advise and call patient back at [phone].

## 2017-12-01 ENCOUNTER — Other Ambulatory Visit: Payer: Self-pay | Admitting: Family Medicine

## 2017-12-01 NOTE — Telephone Encounter (Signed)
Requested Prescriptions  Pending Prescriptions Disp Refills  . sertraline (ZOLOFT) 50 MG tablet [Pharmacy Med Name: SERTRALINE HCL 50 MG TABLET] 60 tablet 0    Sig: TAKE 1 TABLET BY MOUTH ONCE DAILY.     Psychiatry:  Antidepressants - SSRI Passed - 12/01/2017  2:22 PM      Passed - Completed PHQ-2 or PHQ-9 in the last 360 days.      Passed - Valid encounter within last 6 months    Recent Outpatient Visits          3 months ago Non-recurrent acute suppurative otitis media of right ear without spontaneous rupture of tympanic membrane   [facility] [place], [name], DO   6 months ago Acute maxillary sinusitis, recurrence not specified   [facility] [place], [name], DO   7 months ago Dizziness   [facility] [place], [name], DO   1 year ago Recurrent major depressive disorder, in partial remission [facility])   [facility] [place], [name], DO   1 year ago Infertility counseling   [facility] [place], Emma, DO

## 2018-01-02 ENCOUNTER — Other Ambulatory Visit: Payer: Self-pay | Admitting: Family Medicine

## 2018-01-25 ENCOUNTER — Ambulatory Visit (INDEPENDENT_AMBULATORY_CARE_PROVIDER_SITE_OTHER): Payer: BLUE CROSS/BLUE SHIELD | Admitting: Nurse Practitioner

## 2018-01-25 ENCOUNTER — Encounter: Payer: Self-pay | Admitting: Nurse Practitioner

## 2018-01-25 DIAGNOSIS — J0110 Acute frontal sinusitis, unspecified: Secondary | ICD-10-CM | POA: Insufficient documentation

## 2018-01-25 DIAGNOSIS — J0111 Acute recurrent frontal sinusitis: Secondary | ICD-10-CM | POA: Diagnosis not present

## 2018-01-25 MED ORDER — AMOXICILLIN-POT CLAVULANATE 875-125 MG PO TABS: 1.0000 | ORAL_TABLET | Freq: Two times a day (BID) | ORAL | 0 refills | Status: DC

## 2018-01-25 NOTE — Progress Notes (Signed)
BP 115/75   Pulse 80   Temp 98.2 F (36.8 C) (Oral)   Wt 123 lb 9.6 oz (56.1 kg)   SpO2 99%   BMI 23.35 kg/m    Subjective:    Patient ID: Angela [name], female    DOB: [birth year], 32 y.o.   MRN: [id number]  HPI: Angela [name] is a 32 y.o. female  Chief Complaint  Patient presents with  . URI    pt states she has had congestion, cough, ear ache, headache, and sinus pressure since Saturday.   . Oral Swelling    pt states she woke up with her bottom lip swollen this morning    UPPER RESPIRATORY TRACT INFECTION WITH LIP SWELLING Reports this has been ongoing since the weekend.  She feels it is a sinus infection, had one three months ago per her report.  Has had sinus infections in April, and July on review.  Reports taking leftover Amoxicillin at home since Sunday, which belonged to her brother, last took yesterday.  She reports the lip swelling was present this morning, her lower lip was swollen upon waking up.  States she ate some leftover food her husband brought home, but does not know what was in it.  Denies any other recent new foods or medication.  Reports she took Tylenol and now swelling has improved.  She is currently breast feeding. Worst symptom: sinus pressure and drainage Fever: no Cough: yes Shortness of breath: no Wheezing: no Chest pain: no Chest tightness: no Chest congestion: no Nasal congestion: yes Runny nose: yes Post nasal drip: yes Sneezing: yes Sore throat: no Swollen glands: no Sinus pressure: yes Headache: yes Face pain: at first when started teeth all hurt Toothache: no Ear pain: yes bilateral Ear pressure: yes bilateral Eyes red/itching:no Eye drainage/crusting: no  Vomiting: no Rash: no Fatigue: yes Sick contacts: yes, daughter Strep contacts: no  Context: worse Recurrent sinusitis: yes Relief with OTC cold/cough medications: no  Treatments attempted: cold/sinus   Relevant past medical, surgical, family and social history  reviewed and updated as indicated. Interim medical history since our last visit reviewed. Allergies and medications reviewed and updated.  Review of Systems  Constitutional: Positive for activity change and fatigue. Negative for appetite change, diaphoresis and fever.  HENT: Positive for congestion, ear discharge, ear pain, postnasal drip, rhinorrhea, sinus pressure, sinus pain and sneezing. Negative for facial swelling, nosebleeds, sore throat, tinnitus and voice change.   Respiratory: Positive for cough (spit up brown sputum). Negative for chest tightness, shortness of breath and wheezing.   Cardiovascular: Negative for chest pain, palpitations and leg swelling.  Gastrointestinal: Negative for abdominal distention, abdominal pain, constipation, diarrhea, nausea and vomiting.  Neurological: Negative for dizziness, syncope, weakness, light-headedness, numbness and headaches.  Psychiatric/Behavioral: Negative.     Per HPI unless specifically indicated above     Objective:    BP 115/75   Pulse 80   Temp 98.2 F (36.8 C) (Oral)   Wt 123 lb 9.6 oz (56.1 kg)   SpO2 99%   BMI 23.35 kg/m   Wt Readings from Last 3 Encounters:  01/25/18 123 lb 9.6 oz (56.1 kg)  09/01/17 129 lb 5 oz (58.7 kg)  07/05/17 137 lb (62.1 kg)    Physical Exam Vitals signs and nursing note reviewed.  Constitutional:      Appearance: She is well-developed.  HENT:     Head: Normocephalic. No raccoon eyes.     Right Ear: Hearing, ear canal and external  ear normal. A middle ear effusion is present. Tympanic membrane is erythematous.     Left Ear: Hearing, ear canal and external ear normal. A middle ear effusion is present. Tympanic membrane is erythematous.     Nose: Mucosal edema, congestion and rhinorrhea present.     Right Sinus: Frontal sinus tenderness present. No maxillary sinus tenderness.     Left Sinus: Frontal sinus tenderness present. No maxillary sinus tenderness.     Mouth/Throat:     Lips: Pink.      Mouth: Mucous membranes are moist.     Pharynx: Oropharynx is clear. No pharyngeal swelling, oropharyngeal exudate or posterior oropharyngeal erythema.     Comments: Mild swelling to right lower lip with no blister noted.  Small pimple noted underneath lip area. Eyes:     General:        Right eye: No discharge.        Left eye: No discharge.     Conjunctiva/sclera:     Right eye: Right conjunctiva is injected.     Left eye: Left conjunctiva is injected.     Pupils: Pupils are equal, round, and reactive to light.  Neck:     Musculoskeletal: Normal range of motion and neck supple.     Thyroid: No thyromegaly.     Vascular: No carotid bruit or JVD.  Cardiovascular:     Rate and Rhythm: Normal rate and regular rhythm.     Heart sounds: Normal heart sounds.  Pulmonary:     Effort: Pulmonary effort is normal.     Breath sounds: Normal breath sounds.  Abdominal:     General: Bowel sounds are normal.     Palpations: Abdomen is soft.  Lymphadenopathy:     Cervical: No cervical adenopathy.  Skin:    General: Skin is warm and dry.  Neurological:     Mental Status: She is alert and oriented to person, place, and time.  Psychiatric:        Behavior: Behavior normal.        Thought Content: Thought content normal.        Judgment: Judgment normal.     Results for orders placed or performed in visit on 07/05/17  Cytology - PAP  Result Value Ref Range   Adequacy      Satisfactory for evaluation  endocervical/transformation zone component PRESENT.   Diagnosis      NEGATIVE FOR INTRAEPITHELIAL LESIONS OR MALIGNANCY.   HPV NOT DETECTED    Material Submitted CervicoVaginal Pap [ThinPrep Imaged]    CYTOLOGY - PAP PAP RESULT       Assessment & Plan:   Problem List Items Addressed This Visit      Respiratory   Sinusitis, acute frontal    Episode # 3 this year of sinusitis.  Last April and July.  Patient is breastfeeding, no Doxycycline.  Will initiate Augmentin for coverage one tab  twice a day x 10 days.  Encouraged her to to take with probiotic yogurt for GI health.  Recommended use of Flonase daily and Claritin.  Sinus rinses and humidifier.  Encouraged increasing hydration.  Return for worsening or continued symptoms.  Consider ENT referral if continues to have recurrence of infections.      Relevant Medications   amoxicillin-clavulanate (AUGMENTIN) 875-125 MG tablet       Follow up plan: Return if symptoms worsen or fail to improve.

## 2018-01-25 NOTE — Patient Instructions (Signed)
Doxycycline delayed-release capsules What is this medicine? DOXYCYCLINE (dox i SYE kleen) is a tetracycline antibiotic. It is used to treat certain kinds of bacterial infections, Lyme disease, and malaria. It will not work for colds, flu, or other viral infections. This medicine may be used for other purposes; ask your health care provider or pharmacist if you have questions. COMMON BRAND NAME(S): Oracea What should I tell my health care provider before I take this medicine? They need to know if you have any of these conditions: -bowel disease like colitis -liver disease -long exposure to sunlight like working outdoors -an unusual or allergic reaction to doxycycline, tetracycline antibiotics, other medicines, foods, dyes, or preservatives -pregnant or trying to get pregnant -breast-feeding How should I use this medicine? Take this medicine by mouth with a full glass of water. Follow the directions on the prescription label. Do not crush or chew. The capsules may be opened and the pellets sprinkled on applesauce. Swallow the pellets whole without chewing. Follow with an 8 ounce glass of water to help you swallow all the pellets. Do not prepare a dose and store for later use. The applesauce mixture should be taken immediately after you prepare it. It is best to take this medicine without other food, but if it upsets your stomach take it with food. Take your medicine at regular intervals. Do not take your medicine more often than directed. Take all of your medicine as directed even if you think your are better. Do not skip doses or stop your medicine early. Talk to your pediatrician regarding the use of this medicine in children. While this drug may be prescribed for selected conditions, precautions do apply. Overdosage: If you think you have taken too much of this medicine contact a poison control center or emergency room at once. NOTE: This medicine is only for you. Do not share this medicine with  others. What if I miss a dose? If you miss a dose, take it as soon as you can. If it is almost time for your next dose, take only that dose. Do not take double or extra doses. What may interact with this medicine? -antacids -barbiturates -birth control pills -bismuth subsalicylate -carbamazepine -methoxyflurane -other antibiotics -phenytoin -vitamins that contain iron -warfarin This list may not describe all possible interactions. Give your health care provider a list of all the medicines, herbs, non-prescription drugs, or dietary supplements you use. Also tell them if you smoke, drink alcohol, or use illegal drugs. Some items may interact with your medicine. What should I watch for while using this medicine? Tell your doctor or health care professional if your symptoms do not improve. Do not treat diarrhea with over the counter products. Contact your doctor if you have diarrhea that lasts more than 2 days or if it is severe and watery. Do not take this medicine just before going to bed. It may not dissolve properly when you lay down and can cause pain in your throat. Drink plenty of fluids while taking this medicine to also help reduce irritation in your throat. This medicine can make you more sensitive to the sun. Keep out of the sun. If you cannot avoid being in the sun, wear protective clothing and use sunscreen. Do not use sun lamps or tanning beds/booths. If you are being treated for a sexually transmitted infection, avoid sexual contact until you have finished your treatment. Your sexual partner may also need treatment. Avoid antacids, aluminum, calcium, magnesium, and iron products for 4 hours before and   2 hours after taking a dose of this medicine. Birth control pills may not work properly while you are taking this medicine. Talk to your doctor about using an extra method of birth control. If you are using this medicine to prevent malaria, you should still protect yourself from  contact with mosquitos. Stay in screened-in areas, use mosquito nets, keep your body covered, and use an insect repellent. What side effects may I notice from receiving this medicine? Side effects that you should report to your doctor or health care professional as soon as possible: -allergic reactions like skin rash, itching or hives, swelling of the face, lips, or tongue -difficulty breathing -fever -itching in the rectal or genital area -pain on swallowing -redness, blistering, peeling or loosening of the skin, including inside the mouth -severe stomach pain or cramps -unusual bleeding or bruising -unusually weak or tired -yellowing of the eyes or skin Side effects that usually do not require medical attention (report to your doctor or health care professional if they continue or are bothersome): -diarrhea -loss of appetite -nausea, vomiting This list may not describe all possible side effects. Call your doctor for medical advice about side effects. You may report side effects to FDA at 1-800-FDA-1088. Where should I keep my medicine? Keep out of the reach of children. Store at room temperature, below 25 degrees C (77 degrees F). Protect from light. Keep container tightly closed. Throw away any unused medicine after the expiration date. Taking this medicine after the expiration date can make you seriously ill. NOTE: This sheet is a summary. It may not cover all possible information. If you have questions about this medicine, talk to your doctor, pharmacist, or health care provider.  2018 Elsevier/Gold Standard (2015-03-05 10:41:39)  

## 2018-01-25 NOTE — Assessment & Plan Note (Signed)
Episode # 3 this year of sinusitis.  Last April and July.  Patient is breastfeeding, no Doxycycline.  Will initiate Augmentin for coverage one tab twice a day x 10 days.  Encouraged her to to take with probiotic yogurt for GI health.  Recommended use of Flonase daily and Claritin.  Sinus rinses and humidifier.  Encouraged increasing hydration.  Return for worsening or continued symptoms.  Consider ENT referral if continues to have recurrence of infections.

## 2018-01-30 ENCOUNTER — Other Ambulatory Visit: Payer: Self-pay | Admitting: Family Medicine

## 2018-04-24 ENCOUNTER — Encounter: Payer: Self-pay | Admitting: Radiology

## 2018-06-25 ENCOUNTER — Telehealth: Payer: Self-pay | Admitting: *Deleted

## 2018-06-25 NOTE — Telephone Encounter (Signed)
Returned patients call, pt was concerned about some spotting with dark brown blood that she was having and states she's been feeling more hormonal and just not right Pt denies any pain. Pt is currently not on any birth control and thinks her last period was 4/26. Instructed patient to take a UPT and another one in 2 weeks, but if bleeding gets worse or has more issues to call the office back and we will get in her to be seen. Pt verbalizes and understands.

## 2018-06-28 ENCOUNTER — Telehealth (INDEPENDENT_AMBULATORY_CARE_PROVIDER_SITE_OTHER): Payer: BLUE CROSS/BLUE SHIELD | Admitting: Family Medicine

## 2018-06-28 ENCOUNTER — Other Ambulatory Visit: Payer: Self-pay

## 2018-06-28 ENCOUNTER — Encounter: Payer: Self-pay | Admitting: Family Medicine

## 2018-06-28 DIAGNOSIS — J0111 Acute recurrent frontal sinusitis: Secondary | ICD-10-CM | POA: Diagnosis not present

## 2018-06-28 MED ORDER — AMOXICILLIN-POT CLAVULANATE 875-125 MG PO TABS: 1.0000 | ORAL_TABLET | Freq: Two times a day (BID) | ORAL | 0 refills | Status: DC

## 2018-06-28 MED ORDER — PREDNISONE 50 MG PO TABS: 50.0000 mg | ORAL_TABLET | Freq: Every day | ORAL | 0 refills | Status: DC

## 2018-06-28 NOTE — Assessment & Plan Note (Signed)
Will treat with prednisone- if not getting better will take augmentin. Call if not getting better or getting worse. Continue to monitor.

## 2018-06-28 NOTE — Progress Notes (Signed)
There were no vitals taken for this visit.   Subjective:    Patient ID: Angela [name], female    DOB: 1985/11/10, 33 y.o.   MRN: [id number]  HPI: Angela [name] is a 33 y.o. female  Chief Complaint  Patient presents with  . sinus pressure    headache   UPPER RESPIRATORY TRACT INFECTION Duration: couple of days Worst symptom: Headache Fever: no Cough: no Shortness of breath: no Wheezing: no Chest pain: yes, with cough Chest tightness: no Chest congestion: no Nasal congestion: yes Runny nose: no Post nasal drip: no Sneezing: no Sore throat: no Swollen glands: no Sinus pressure: yes Headache: yes Face pain: yes Toothache: yes Ear pain: no  Ear pressure: no  Eyes red/itching:no Eye drainage/crusting: no  Vomiting: no Rash: no Fatigue: yes Sick contacts: no Strep contacts: no  Context: better Recurrent sinusitis: no Relief with OTC cold/cough medications: yes  Treatments attempted: sudafed    Relevant past medical, surgical, family and social history reviewed and updated as indicated. Interim medical history since our last visit reviewed. Allergies and medications reviewed and updated.  Review of Systems  Constitutional: Negative.   HENT: Positive for congestion. Negative for dental problem, drooling, ear discharge, ear pain, facial swelling, hearing loss, mouth sores, nosebleeds, postnasal drip, rhinorrhea, sinus pressure, sinus pain, sneezing, sore throat, tinnitus, trouble swallowing and voice change.   Eyes: Negative.   Respiratory: Negative.   Cardiovascular: Negative.   Psychiatric/Behavioral: Negative.     Per HPI unless specifically indicated above     Objective:    There were no vitals taken for this visit.  Wt Readings from Last 3 Encounters:  01/25/18 123 lb 9.6 oz (56.1 kg)  09/01/17 129 lb 5 oz (58.7 kg)  07/05/17 137 lb (62.1 kg)    Physical Exam Vitals signs and nursing note reviewed.  Constitutional:      General: She is  not in acute distress.    Appearance: Normal appearance. She is not ill-appearing, toxic-appearing or diaphoretic.  HENT:     Head: Normocephalic and atraumatic.     Right Ear: External ear normal.     Left Ear: External ear normal.     Nose: Congestion and rhinorrhea present.     Mouth/Throat:     Mouth: Mucous membranes are moist.     Pharynx: Oropharynx is clear. No oropharyngeal exudate or posterior oropharyngeal erythema.  Eyes:     General: No scleral icterus.       Right eye: No discharge.        Left eye: No discharge.     Conjunctiva/sclera: Conjunctivae normal.     Pupils: Pupils are equal, round, and reactive to light.  Neck:     Musculoskeletal: Normal range of motion.  Pulmonary:     Effort: Pulmonary effort is normal. No respiratory distress.     Comments: Speaking in full sentences Musculoskeletal: Normal range of motion.  Skin:    Coloration: Skin is not jaundiced or pale.     Findings: No bruising, erythema, lesion or rash.  Neurological:     Mental Status: She is alert and oriented to person, place, and time. Mental status is at baseline.  Psychiatric:        Mood and Affect: Mood normal.        Behavior: Behavior normal.        Thought Content: Thought content normal.        Judgment: Judgment normal.     Results for  orders placed or performed in visit on 07/05/17  Cytology - PAP  Result Value Ref Range   Adequacy      Satisfactory for evaluation  endocervical/transformation zone component PRESENT.   Diagnosis      NEGATIVE FOR INTRAEPITHELIAL LESIONS OR MALIGNANCY.   HPV NOT DETECTED    Material Submitted CervicoVaginal Pap [ThinPrep Imaged]    CYTOLOGY - PAP PAP RESULT       Assessment & Plan:   Problem List Items Addressed This Visit      Respiratory   Sinusitis, acute frontal - Primary    Will treat with prednisone- if not getting better will take augmentin. Call if not getting better or getting worse. Continue to monitor.       Relevant  Medications   predniSONE (DELTASONE) 50 MG tablet   amoxicillin-clavulanate (AUGMENTIN) 875-125 MG tablet       Follow up plan: Return if symptoms worsen or fail to improve.   . This visit was completed via mychart due to the restrictions of the COVID-19 pandemic. All issues as above were discussed and addressed. Physical exam was done as above through visual confirmation on mychart. If it was felt that the patient should be evaluated in the office, they were directed there. The patient verbally consented to this visit. . Location of the patient: work . Location of the provider: home . Those involved with this call:  . Provider: Olevia Perches, DO . CMA: Tiffany Reel, CMA . Front Desk/Registration: Landis Martins  . Time spent on call: 15 minutes with patient face to face via video conference. More than 50% of this time was spent in counseling and coordination of care. 23 minutes total spent in review of patient's record and preparation of their chart.

## 2018-07-23 ENCOUNTER — Ambulatory Visit: Payer: BLUE CROSS/BLUE SHIELD | Admitting: Obstetrics and Gynecology

## 2018-07-26 ENCOUNTER — Other Ambulatory Visit: Payer: Self-pay

## 2018-07-26 ENCOUNTER — Encounter: Payer: Self-pay | Admitting: Obstetrics and Gynecology

## 2018-07-26 ENCOUNTER — Ambulatory Visit: Payer: BC Managed Care – PPO | Admitting: Obstetrics and Gynecology

## 2018-07-26 VITALS — BP 119/69 | HR 82 | Wt 120.0 lb

## 2018-07-26 DIAGNOSIS — R102 Pelvic and perineal pain: Secondary | ICD-10-CM

## 2018-07-26 NOTE — Progress Notes (Signed)
Pressure in her labia that comes and goes since the birth of her baby Would like to discuss issues she is having with her repair from her vaginal repair from her delivery

## 2018-07-26 NOTE — Progress Notes (Signed)
Obstetrics and Gynecology Visit Return Patient Evaluation  Appointment Date: 07/26/2018  Primary Care Provider: Valerie Roys  OBGYN Clinic: [facility]  Chief Complaint: vaginal pain since delivery  History of Present Illness:  Angela [name] is a 33 y.o. P1 with the above CC. Patient had a vaginal delivery with a peri-uretheral laceration (repaired with vicryl).  Patient states that her stress urinary incontinence resolved on its own and she neve went to pelvic PT. She states that she has vaginal discomfort that seems to be getting worse and it started after delivery and maybe aggravated with tampon use. No pain with sex, dyspareunia, constipation, worse with bowel movements. It's all on the outside skin and not deep in the vagina.  She also notes discomfort, since delivery, with the labia minora on the right with wearing tights, swimsuits, etc.  No lower urinary tract s/s  Review of Systems:  as noted in the History of Present Illness.  Medications:  Laresa A. Mentink had no medications administered during this visit. Current Outpatient Medications  Medication Sig Dispense Refill  . sertraline (ZOLOFT) 50 MG tablet TAKE 1 TABLET BY MOUTH ONCE DAILY. 90 tablet 1  . Probiotic Product (PROBIOTIC-10 PO) Take by mouth.     No current facility-administered medications for this visit.     Allergies: has No Known Allergies.  Physical Exam:  BP 119/69   Pulse 82   Wt 120 lb (54.4 kg)   BMI 22.67 kg/m  Body mass index is 22.67 kg/m. General appearance: Well nourished, well developed female in no acute distress.  Neuro/Psych:  Normal mood and affect.    Pelvic exam:  EGBUS, vaginal introitus normal When patient asked to point to affected areas, for the vaginal discomfort, she points to the bilateral areas from the area just above the rectum up to the top of the labia majora.  For the area that worsens with tights, swimsuits, etc, she points to the  labia majora and on the right side.    Assessment: pt stable  Plan:  1. Vaginal pain I told her that the bilateral pain and maybe even the labia minora pain that it could be due to some pelvic floor issue and that I don't see anything surgical to be addressed. I told her that I feel that pelvic floor PT would be beneficial. Pt lives near [place] so will see if can find someone near there.  Pt is amenable to plan  RTC: PRN  Durene Romans MD Attending Center for [facility])

## 2018-08-08 ENCOUNTER — Telehealth: Payer: Self-pay | Admitting: Family Medicine

## 2018-08-08 MED ORDER — SERTRALINE HCL 50 MG PO TABS: 50.0000 mg | ORAL_TABLET | Freq: Every day | ORAL | 0 refills | Status: DC

## 2018-08-08 NOTE — Telephone Encounter (Signed)
Please schedule follow up

## 2018-08-08 NOTE — Telephone Encounter (Signed)
Scheduled

## 2018-08-08 NOTE — Telephone Encounter (Signed)
Medication Refill - Medication: sertraline (ZOLOFT) 50 MG tablet    Has the patient contacted their pharmacy? Yes.   (Agent: If no, request that the patient contact the pharmacy for the refill.) (Agent: If yes, when and what did the pharmacy advise?)  Preferred Pharmacy (with phone number or street name): [facility]  Pt changed pharm Pt is out of meds    Agent: Please be advised that RX refills may take up to 3 business days. We ask that you follow-up with your pharmacy.

## 2018-08-13 ENCOUNTER — Ambulatory Visit: Payer: Self-pay | Admitting: Family Medicine

## 2018-08-15 ENCOUNTER — Other Ambulatory Visit: Payer: Self-pay | Admitting: *Deleted

## 2018-08-15 DIAGNOSIS — N393 Stress incontinence (female) (male): Secondary | ICD-10-CM

## 2018-08-23 ENCOUNTER — Ambulatory Visit: Payer: BC Managed Care – PPO | Attending: Obstetrics and Gynecology | Admitting: Physical Therapy

## 2018-08-23 ENCOUNTER — Other Ambulatory Visit: Payer: Self-pay

## 2018-08-23 ENCOUNTER — Encounter: Payer: Self-pay | Admitting: Physical Therapy

## 2018-08-23 DIAGNOSIS — R278 Other lack of coordination: Secondary | ICD-10-CM | POA: Diagnosis not present

## 2018-08-23 DIAGNOSIS — M6208 Separation of muscle (nontraumatic), other site: Secondary | ICD-10-CM | POA: Diagnosis not present

## 2018-08-23 DIAGNOSIS — M62838 Other muscle spasm: Secondary | ICD-10-CM

## 2018-08-23 NOTE — Therapy (Signed)
[facility] [address] [place], [place], [address] Phone: [phone]   Fax:  [phone]  Physical Therapy Evaluation  Patient Details  Name: Angela SchoonerMagan A Romanoff MRN: [id number] Date of Birth: [birth year] Referring Provider (PT): Ivery Qualeharle Pickens, MD   Encounter Date: 08/23/2018  PT End of Session - 08/23/18 1822    Visit Number  1    Number of Visits  10    PT Start Time  1510    PT Stop Time  1610    PT Time Calculation (min)  60 min       Past Medical History:  Diagnosis Date  . Bilateral carpal tunnel syndrome 11/18/2015  . Cholestasis of pregnancy in third trimester 01/11/2017  . Depression with anxiety 05/07/2014  . GERD (gastroesophageal reflux disease)   . IBS (irritable bowel syndrome)   . Migraines   . Missed abortion 03/26/2014  . Multiple thyroid nodules   . Painful menstrual periods 11/13/2014  . Raynaud disease   . Raynaud's disease     Past Surgical History:  Procedure Laterality Date  . COLONOSCOPY  11/03/11   [place]:colonic mucosa appeared normal  . DG SELECTED [facility] ONLY    . WISDOM TOOTH EXTRACTION      There were no vitals filed for this visit.   Subjective Assessment - 08/23/18 1714    Subjective  1) Pelvic pain:  first child 1.5 years ago, pt noticed she randomly would get pain in the pelvic area near the tailbone . When she had her last period, pt tried using a tampon because she had to go to the pool. After she took it out, she had pelvic pain. After sexual intercourse, pt had pain. Pt also has pressure feeling that occurs randomly that is located in the vaginal area. It has occurred with doing dishes. Gynecological Hx: during labor, pt had a small tear. Symptoms occurred right after L& D. It took a long time to heal the tear. It hangs lower    2)  CLBP:  R sided glut pain. Non radiating.  Currently 2-3/10. It comes on when laying down a certain way. Pt had sciatic nerve pain in her R  buttiock during the whole pregnancy.    3) IBS-constipation prior to pregnancy and currently. Bowel movements occur every 2-3 days. Bristol Stool Scale 1 ( 75% of the time), Type 2 ( 25% of the time) ,  Daily fluid intake: 6 cups water, 1 cup coffee, 12 oz of soda.  Straining sometimes with bowel movements.    Pertinent History  Incontinence issues is not a problem currently but it was an issue right after the birth. Denied surgeries/ injuries. Pt has performed sit ups and crunches in the past. Sits all that at work, reclined in the chair with legs kicked forward.    Patient Stated Goals  be able to stand up and wash dishes, for sex to be more enjoying, and getting help to make sure she is able to have another child         [facility] Assessment - 08/23/18 1849      Assessment   Referring Provider (PT)  Ivery Qualeharle Pickens, MD      Precautions   Precautions  None      Restrictions   Weight Bearing Restrictions  No      Balance Screen   Has the patient fallen in the past 6 months  No      Prior Function  Level of Independence  Independent      Observation/Other Assessments   Observations  ankles crossed , slumped position, simulated desk position with L trunk rotation with 2 computer screens. simulated posture in office chair, leaned back, knees extended, sacral sitting       Coordination   Gross Motor Movements are Fluid and Coordinated  --   limited lateral diaphragmatic excursion   Fine Motor Movements are Fluid and Coordinated  --   overuse of ab with pelvic floor contraction     Strength   Overall Strength  --   BLE 5/5      Palpation   Spinal mobility  thoracic hypomobility     SI assessment   levelled and equal alignment    Palpation comment  no pelvic floor tensions but plan to assess further next session      Bed Mobility   Bed Mobility  --   crunch               Objective measurements completed on examination: See above findings.    Pelvic Floor  Special Questions - 08/23/18 1852    Diastasis Recti  4 fingers medial linea alba, 3 fingers above umbilicus, 1 knuckle depth, 2 fingers width with head lift ( post Tx: no knuckle depth, 2 fingers with headlift medial linea alba, closure at above umbilicus )       [facility] Adult PT Treatment/Exercise - 08/23/18 1849      Neuro Re-ed    Neuro Re-ed Details   deep core 1, breathing, cued for less overuse of ab, discussed work station set up to minimize excessive L trunk rotation       Manual Therapy   Manual therapy comments  fascial pulling in quadriped to close daistasis, STM/ MWM at thoracic region, anterior / lateral ribs                   PT Long Term Goals - 08/23/18 1855      PT LONG TERM GOAL #1   Title  Pt will demo proper deep core coordination with proper diaphragmatic excursion in order to optimize postural stability and pelvic floor function    Time  2    Period  Weeks    Status  New    Target Date  09/06/18      PT LONG TERM GOAL #2   Title  Pt wil demo decreased abdominal separation from 4   fingers width to < 1 figners width in order to improve intraabdominal pressure and progress with deep core strangthening, improve motility, pelvic floor function    Time  4    Period  Weeks    Status  New    Target Date  09/20/18      PT LONG TERM GOAL #3   Title  Pt will report being able to wear tampon without pain afterwards in order to go into pools on her mnstrual cycle    Time  8    Period  Weeks    Status  New    Target Date  10/18/18      PT LONG TERM GOAL #4   Title  Pt will report no pressure sensation when standing to do the dishes across 2 occasions in order to perform ADLs    Time  10    Period  Weeks    Status  New    Target Date  11/01/18      PT LONG TERM GOAL #  5   Title  Pt will decrease PFIQ-7 score from 10% to < 5% in order to restore pelvic floor function and improve QOL    Time  10    Period  Weeks    Status  New    Target Date  11/01/18       Additional Long Term Goals   Additional Long Term Goals  Yes      PT LONG TERM GOAL #6   Title  Pt will report improved Stool consistency from Type 1 ( 75% of the time), Type 2 ( 25% ) to Type 4 ( 75% of the time ) in order to promote GI and pelvic floor function    Time  10    Period  Weeks    Status  New    Target Date  11/01/18      PT LONG TERM GOAL #7   Title  Pt will decrease ODI score from8 % to < 4% in order to improve functional mobility and increase activities    Time  10    Period  Weeks    Status  New    Target Date  11/01/18             Plan - 08/23/18 1854    Clinical Impression Statement  Pt is a 33 yo female who reports pelvic pain, pressure sensation, CLBP which started after the delivery of her first child 1.5 years ago. Pt also reports of chronic constipation with Hx of IBS. These deficits impact her QOL and functional activities.  Pt presents with limited spinal /pelvic mobility, dyscoordination and strength of pelvic floor mm, diastasis recti, and poor body mechanics which places strain on the abdominal/pelvic floor mm. These are deficits that indicate an ineffective intraabdominal pressure system associated with her Sx.  Pt was provided education on etiology of Sx with anatomy, physiology explanation with images along with the benefits of customized pelvic PT Tx based on pt's medical conditions and musculoskeletal deficits.  Explained the physiology of deep core mm coordination and roles of pelvic floor function in urination, defecation, sexual function, and postural control with deep core mm system.  Following Tx today which pt tolerated without complaints, pt demo'd increased spinal mobility, increased diaphragmatic excursion, and increased closure of diastasis recti. Plan to assess pelvic floor for related possible scar adhesions 2/2 perineal tear during labor.  Pt demo'd understanding to ergonomic recommendations to minimize mm tightness and less strain on  pelvic floor/ back.      Personal Factors and Comorbidities  Age;Fitness    Examination-Activity Limitations  Other    Stability/Clinical Decision Making  Evolving/Moderate complexity    Clinical Decision Making  Low    Rehab Potential  Good    PT Frequency  1x / week    PT Duration  --   10   PT Treatment/Interventions  Gait training;Neuromuscular re-education;Functional mobility training;Moist Heat;Therapeutic activities;Therapeutic exercise;Patient/family education;Manual techniques;Scar mobilization    Consulted and Agree with Plan of Care  Patient       Patient will benefit from skilled therapeutic intervention in order to improve the following deficits and impairments:  Hypomobility, Decreased scar mobility, Decreased strength, Decreased mobility, Postural dysfunction, Improper body mechanics, Pain, Increased muscle spasms, Decreased coordination, Decreased range of motion  Visit Diagnosis: 1. Other lack of coordination   2. Diastasis recti   3. Other muscle spasm        Problem List Patient Active Problem List   Diagnosis Date Noted  .  Sinusitis, acute frontal 01/25/2018  . Stress incontinence of urine 03/15/2017  . Gastroesophageal reflux disease without esophagitis 10/07/2016  . Epistaxis 07/21/2016  . Bilateral carpal tunnel syndrome 11/18/2015  . IBS (irritable bowel syndrome) 05/07/2014  . Raynaud disease 05/07/2014  . Depression with anxiety 05/07/2014  . Smoker 05/07/2014    Mariane MastersYeung,Shin Yiing ,PT, DPT, E-RYT  08/23/2018, 7:14 PM  [place] [facility] [address] [place], [place], [address] Phone: [phone]   Fax:  [phone]  Name: Angela SchoonerMagan A Topping MRN: [id number] Date of Birth: 09-11-1985

## 2018-08-23 NOTE — Patient Instructions (Signed)
Stage manager ( handout)

## 2018-08-30 ENCOUNTER — Ambulatory Visit: Payer: BC Managed Care – PPO | Admitting: Physical Therapy

## 2018-09-06 ENCOUNTER — Other Ambulatory Visit: Payer: Self-pay

## 2018-09-06 ENCOUNTER — Ambulatory Visit: Payer: BC Managed Care – PPO | Admitting: Physical Therapy

## 2018-09-06 DIAGNOSIS — R278 Other lack of coordination: Secondary | ICD-10-CM | POA: Diagnosis not present

## 2018-09-06 DIAGNOSIS — M62838 Other muscle spasm: Secondary | ICD-10-CM

## 2018-09-06 DIAGNOSIS — M6208 Separation of muscle (nontraumatic), other site: Secondary | ICD-10-CM | POA: Diagnosis not present

## 2018-09-07 NOTE — Patient Instructions (Signed)
Stretch for pelvic floor   "v heels slide away and then back toward buttocks and then rock knee to slight ,  slide heel along at 11 o clock away from buttocks   10 reps    

## 2018-09-07 NOTE — Therapy (Signed)
[place] MAIN [facility] SERVICES [address] [place], [place], [address] Phone: [phone]   Fax:  [phone]  Physical Therapy Treatment  Patient Details  Name: Angela [name] MRN: [id number] Date of Birth: [birth year] Referring Provider (PT): Laurence Compton, MD   Encounter Date: 09/06/2018  PT End of Session - 09/06/18 1708    Visit Number  2    Number of Visits  10    PT Start Time  4166    PT Stop Time  1800    PT Time Calculation (min)  55 min       Past Medical History:  Diagnosis Date  . Bilateral carpal tunnel syndrome 11/18/2015  . Cholestasis of pregnancy in third trimester 01/11/2017  . Depression with anxiety 05/07/2014  . GERD (gastroesophageal reflux disease)   . IBS (irritable bowel syndrome)   . Migraines   . Missed abortion 03/26/2014  . Multiple thyroid nodules   . Painful menstrual periods 11/13/2014  . Raynaud disease   . Raynaud's disease     Past Surgical History:  Procedure Laterality Date  . COLONOSCOPY  11/03/11   [place]:colonic mucosa appeared normal  . DG SELECTED HSG GDC ONLY    . WISDOM TOOTH EXTRACTION      There were no vitals filed for this visit.  Subjective Assessment - 09/06/18 1709    Subjective  Pt has not had any LBP for the past 2 weeks. Pt felt sciatic pain down her leg but it goes quickly. Pt found out she is 7 weeks pregnancy and is afraid she will encounter issues during pregnancy    Pertinent History  Incontinence issues is not a problem currently but it was an issue right after the birth. Denied surgeries/ injuries. Pt has performed sit ups and crunches in the past. Sits all that at work, reclined in the chair with legs kicked forward.    Patient Stated Goals  be able to stand up and wash dishes, for sex to be more enjoying, and getting help to make sure she is able to have another child                    Pelvic Floor Special Questions - 09/07/18 1112    Diastasis  Recti  no depth and more closure     External Perineal Exam  through clothing: L pelvic floor with no initiation on cue to contract, tightness noted along ischial rami ( decreased post Tx and pelvic floor movement lift noted post Tx)  Grade 2 MMT, pre Tx, Grade 3+ post Tx . 6 reps  1 sec         [facility] Adult PT Treatment/Exercise - 09/07/18 1112      Neuro Re-ed    Neuro Re-ed Details   cued for deep core 1-2 , less overuse wiith glut      Manual Therapy   Manual therapy comments  external: STM/MWM at deep/superficial deep perineal mm R                   PT Long Term Goals - 09/07/18 1118      PT LONG TERM GOAL #1   Title  Pt will demo proper deep core coordination with proper diaphragmatic excursion in order to optimize postural stability and pelvic floor function    Time  2    Period  Weeks    Status  On-going      PT LONG TERM GOAL #2  Title  Pt wil demo decreased abdominal separation from 4   fingers width to < 1 figners width in order to improve intraabdominal pressure and progress with deep core strangthening, improve motility, pelvic floor function    Time  4    Period  Weeks    Status  Achieved      PT LONG TERM GOAL #3   Title  Pt will report being able to wear tampon without pain afterwards in order to go into pools on her mnstrual cycle    Time  8    Period  Weeks    Status  On-going      PT LONG TERM GOAL #4   Title  Pt will report no pressure sensation when standing to do the dishes across 2 occasions in order to perform ADLs    Time  10    Period  Weeks    Status  On-going      PT LONG TERM GOAL #5   Title  Pt will decrease PFIQ-7 score from 10% to < 5% in order to restore pelvic floor function and improve QOL    Time  10    Period  Weeks    Status  On-going      Additional Long Term Goals   Additional Long Term Goals  Yes      PT LONG TERM GOAL #6   Title  Pt will report improved Stool consistency from Type 1 ( 75% of the time), Type 2 (  25% ) to Type 4 ( 75% of the time ) in order to promote GI and pelvic floor function    Time  10    Period  Weeks    Status  On-going      PT LONG TERM GOAL #7   Title  Pt will decrease ODI score from8 % to < 4% in order to improve functional mobility and increase activities    Time  10    Period  Weeks    Status  On-going      PT LONG TERM GOAL #8   Title  Pt will demo increased pelvic floor strength from Grade 3, 6 reps , 1 sec contractions to 10 reps without fatigue in order to increase pelvic stability and minimize dysfunctions during pregnancy    Time  8    Period  Weeks    Status  New    Target Date  11/02/18      PT LONG TERM GOAL  #9   TITLE  Pt will demo no fascial restrictions at L 4 o'clock / ischial rami and demo symmetrical lift of pelvic floor mm on cue for contraction in orrder to minimize pain and insert tampons    Time  4    Period  Weeks    Status  New    Target Date  10/05/18            Plan - 09/07/18 1125    Clinical Impression Statement  Pt demo'd resolved diastasis recti and maintained equal pelvic girdle alignment. Today addressed pelvic floor tightness/ limited mobility on L with external manual Tx which pt tolerated without pain. Pt demo'd more symmetrical pelvic floor ocntraction post Tx and progressed to deep core coordination and strengthening. Initiated quick pelvic floor contractions today. Plan to provide SIJ belt next session and add thoracolumbar strengthening at next session.    Personal Factors and Comorbidities  Age;Fitness    Examination-Activity Limitations  Other  Stability/Clinical Decision Making  Evolving/Moderate complexity    Rehab Potential  Good    PT Frequency  1x / week    PT Duration  --   10   PT Treatment/Interventions  Gait training;Neuromuscular re-education;Functional mobility training;Moist Heat;Therapeutic activities;Therapeutic exercise;Patient/family education;Manual techniques;Scar mobilization    Consulted and  Agree with Plan of Care  Patient       Patient will benefit from skilled therapeutic intervention in order to improve the following deficits and impairments:  Hypomobility, Decreased scar mobility, Decreased strength, Decreased mobility, Postural dysfunction, Improper body mechanics, Pain, Increased muscle spasms, Decreased coordination, Decreased range of motion  Visit Diagnosis: 1. Other lack of coordination   2. Diastasis recti   3. Other muscle spasm        Problem List Patient Active Problem List   Diagnosis Date Noted  . Sinusitis, acute frontal 01/25/2018  . Stress incontinence of urine 03/15/2017  . Gastroesophageal reflux disease without esophagitis 10/07/2016  . Epistaxis 07/21/2016  . Bilateral carpal tunnel syndrome 11/18/2015  . IBS (irritable bowel syndrome) 05/07/2014  . Raynaud disease 05/07/2014  . Depression with anxiety 05/07/2014  . Smoker 05/07/2014    Mariane MastersYeung,Shin Yiing ,PT, DPT, E-RYT  09/07/2018, 11:26 AM  [place] [facility] [address] [place], [place], [id number] Phone: [phone]   Fax:  [phone]  Name: Angela [name] MRN: [id number] Date of Birth: 10-May-1985

## 2018-09-11 ENCOUNTER — Telehealth (HOSPITAL_COMMUNITY): Payer: Self-pay | Admitting: Lactation Services

## 2018-09-11 NOTE — Telephone Encounter (Signed)
Returned telephone call - no answer - LC left a message for mom to call [facility] services.

## 2018-09-11 NOTE — Telephone Encounter (Signed)
Out going - Lactation services -  Called this mom back after 3 attempts and getting her voice mail.  Per mom stopped breast feeding 1 week ago after 20 months due to the  Baby biting and being sore due to being [redacted] weeks pregnant.  Mom mentioned the breast the baby favored the most and seemed to produce  The most milk is not drying up the other breast is fine. The breast is firm with swollen  Milk ducts and when it gets really uncomfortable hand express down to comfort.  Using cold cabbage leaves days and nights. Even at work.  Ice packs. Also have been taking a warm bath and that has helped.  Mom expressed she really wants to dry up her milk.  Mom denies and S/S's of mastitis.   LC discussed with mom her body is probably confused due to her being [redacted] weeks pregnant,  And her recently stopping breast feeding ( her 85 month old was only breast feeding 1-2 times at night)  LC reminded mom the 1st trimester is when a mother sees the most breast changes with her pregnancy.   LC reviewed the regimen for drying up the milk with cold cabbage leaves ( popping the veins ) ice packs for  Comfort. LC also explored another option with mom - if she becomes uncomfortable express off the to comfort and save the  Breast milk and freeze it for her next baby.   LC recommended to call back with questions.

## 2018-09-13 ENCOUNTER — Ambulatory Visit: Payer: BC Managed Care – PPO | Admitting: Physical Therapy

## 2018-09-13 ENCOUNTER — Other Ambulatory Visit: Payer: Self-pay

## 2018-09-20 ENCOUNTER — Ambulatory Visit: Payer: BC Managed Care – PPO | Admitting: Physical Therapy

## 2018-09-27 ENCOUNTER — Encounter: Payer: BC Managed Care – PPO | Admitting: Physical Therapy

## 2018-10-01 ENCOUNTER — Encounter: Payer: Self-pay | Admitting: Family Medicine

## 2018-10-01 ENCOUNTER — Other Ambulatory Visit: Payer: Self-pay

## 2018-10-01 ENCOUNTER — Ambulatory Visit (INDEPENDENT_AMBULATORY_CARE_PROVIDER_SITE_OTHER): Payer: BC Managed Care – PPO | Admitting: Family Medicine

## 2018-10-01 DIAGNOSIS — Z3481 Encounter for supervision of other normal pregnancy, first trimester: Secondary | ICD-10-CM

## 2018-10-01 DIAGNOSIS — Z3480 Encounter for supervision of other normal pregnancy, unspecified trimester: Secondary | ICD-10-CM | POA: Insufficient documentation

## 2018-10-01 DIAGNOSIS — Z113 Encounter for screening for infections with a predominantly sexual mode of transmission: Secondary | ICD-10-CM | POA: Diagnosis not present

## 2018-10-01 DIAGNOSIS — Z3A10 10 weeks gestation of pregnancy: Secondary | ICD-10-CM

## 2018-10-01 DIAGNOSIS — Z31430 Encounter of female for testing for genetic disease carrier status for procreative management: Secondary | ICD-10-CM | POA: Diagnosis not present

## 2018-10-01 NOTE — Progress Notes (Signed)
DATING AND VIABILITY SONOGRAM   Angela [name] is a 33 y.o. year old G50P1011 with LMP Patient's last menstrual period was 07/20/2018 (exact date). which would correlate to  [redacted]w[redacted]d weeks gestation.  She has regular menstrual cycles.   She is here today for a confirmatory initial sonogram.    GESTATION: SINGLETON yes     FETAL ACTIVITY:          Heart rate     160          The fetus is active.   GESTATIONAL AGE AND  BIOMETRICS:  Gestational criteria: Estimated Date of Delivery: 04/26/19 by LMP now at [redacted]w[redacted]d  Previous Scans:0  GESTATIONAL SAC            mm          weeks  CROWN RUMP LENGTH           3.52 mm         10.2 weeks                                                                               AVERAGE EGA(BY THIS SCAN):  10.2 weeks  WORKING EDD( LMP ):  04/26/2019     TECHNICIAN COMMENTS: Patient informed that the ultrasound is considered a limited obstetric ultrasound and is not intended to be a complete ultrasound exam. Patient also informed that the ultrasound is not being completed with the intent of assessing for fetal or placental anomalies or any pelvic abnormalities. Explained that the purpose of today's ultrasound is to assess for fetal heart rate. Patient acknowledges the purpose of the exam and the limitations of the study.      A copy of this report including all images has been saved and backed up to a second source for retrieval if needed. All measures and details of the anatomical scan, placentation, fluid volume and pelvic anatomy are contained in that report.  Crosby Oyster 10/01/2018 4:19 PM

## 2018-10-01 NOTE — Patient Instructions (Signed)

## 2018-10-02 ENCOUNTER — Encounter: Payer: Self-pay | Admitting: Family Medicine

## 2018-10-02 LAB — OBSTETRIC PANEL, INCLUDING HIV
Antibody Screen: NEGATIVE
Basophils Absolute: 0 10*3/uL (ref 0.0–0.2)
Basos: 0 %
EOS (ABSOLUTE): 0 10*3/uL (ref 0.0–0.4)
Eos: 0 %
HIV Screen 4th Generation wRfx: NONREACTIVE
Hematocrit: 35.4 % (ref 34.0–46.6)
Hemoglobin: 11.8 g/dL (ref 11.1–15.9)
Hepatitis B Surface Ag: NEGATIVE
Immature Grans (Abs): 0 10*3/uL (ref 0.0–0.1)
Immature Granulocytes: 1 %
Lymphocytes Absolute: 1.3 10*3/uL (ref 0.7–3.1)
Lymphs: 28 %
MCH: 26.8 pg (ref 26.6–33.0)
MCHC: 33.3 g/dL (ref 31.5–35.7)
MCV: 80 fL (ref 79–97)
Monocytes Absolute: 0.3 10*3/uL (ref 0.1–0.9)
Monocytes: 7 %
Neutrophils Absolute: 2.9 10*3/uL (ref 1.4–7.0)
Neutrophils: 64 %
Platelets: 212 10*3/uL (ref 150–450)
RBC: 4.41 x10E6/uL (ref 3.77–5.28)
RDW: 13.1 % (ref 11.7–15.4)
RPR Ser Ql: NONREACTIVE
Rh Factor: POSITIVE
Rubella Antibodies, IGG: 2.49 index (ref >0.99)
WBC: 4.4 10*3/uL (ref 3.4–10.8)

## 2018-10-02 NOTE — Progress Notes (Signed)
Subjective:   Angela [name] is a 33 y.o. G3P1011 at [redacted]w[redacted]d by LMP, early ultrasound being seen today for her first obstetrical visit.  Her obstetrical history is not significant. Patient does intend to breast feed. Pregnancy history fully reviewed.  Patient reports fatigue.  HISTORY: OB History  Gravida Para Term Preterm AB Living  3 1 1  0 1 1  SAB TAB Ectopic Multiple Live Births  1 0 0 0 1    # Outcome Date GA Lbr Len/2nd Weight Sex Delivery Anes PTL Lv  3 Current           2 Term 01/12/17 [redacted]w[redacted]d 30:31 / 00:44 6 lb 6.8 oz (2.914 kg) F Vag-Spont EPI  LIV     Birth Comments: n/a     Name: KINJAL, NEITZKE     Apgar1: 8  Apgar5: 9  1 SAB 03/26/14 [redacted]w[redacted]d          Last pap smear was  07/05/2017 and was normal Past Medical History:  Diagnosis Date  . Bilateral carpal tunnel syndrome 11/18/2015  . Cholestasis of pregnancy in third trimester 01/11/2017  . Depression with anxiety 05/07/2014  . GERD (gastroesophageal reflux disease)   . IBS (irritable bowel syndrome)   . Migraines   . Missed abortion 03/26/2014  . Multiple thyroid nodules   . Painful menstrual periods 11/13/2014  . Raynaud disease   . Raynaud's disease    Past Surgical History:  Procedure Laterality Date  . COLONOSCOPY  11/03/11   [place]:colonic mucosa appeared normal  . DG SELECTED HSG GDC ONLY    . WISDOM TOOTH EXTRACTION     Family History  Problem Relation Age of Onset  . Diabetes Father   . Heart disease Father   . Alcohol abuse Father   . Hypertension Father   . Rheum arthritis Mother   . Arthritis Mother        rheumatoid  . Diabetes Paternal Grandfather   . Congestive Heart Failure Paternal Grandfather   . Alzheimer's disease Paternal Grandmother   . Other Maternal Grandmother        obstruction in stomach  . Stroke Maternal Grandfather   . Diabetes Maternal Grandfather   . Diabetes Sister   . Hypertension Sister   . Hypertension Brother   . Autism Cousin   . Autism Cousin   .  Colon cancer Neg Hx   . Esophageal cancer Neg Hx   . Rectal cancer Neg Hx   . Stomach cancer Neg Hx    Social History   Tobacco Use  . Smoking status: Former Smoker    Packs/day: 0.25    Years: 10.00    Pack years: 2.50    Types: Cigarettes    Quit date: 04/14/2016    Years since quitting: 2.4  . Smokeless tobacco: Never Used  . Tobacco comment: 5 cigs per day  Substance Use Topics  . Alcohol use: No    Comment: occ wine  . Drug use: No   No Known Allergies Current Outpatient Medications on File Prior to Visit  Medication Sig Dispense Refill  . Prenatal Vit-Fe Fumarate-FA (PRENATAL MULTIVITAMIN) TABS tablet Take 1 tablet by mouth daily at 12 noon.    . sertraline (ZOLOFT) 50 MG tablet Take 1 tablet (50 mg total) by mouth daily. 90 tablet 0   No current facility-administered medications on file prior to visit.      Exam   Vitals:   10/01/18 1554 10/01/18 1557  BP:  105/67   Pulse: 79   Temp: 98.2 F (36.8 C)   Weight: 126 lb 6.4 oz (57.3 kg)   Height:  5\' 2"  (1.575 m)   Fetal Heart Rate (bpm): 160  System: General: well-developed, well-nourished female in no acute distress   Skin: normal coloration and turgor, no rashes   Neurologic: oriented, normal, negative, normal mood   Extremities: normal strength, tone, and muscle mass, ROM of all joints is normal   HEENT PERRLA, extraocular movement intact and sclera clear, anicteric   Mouth/Teeth mucous membranes moist, pharynx normal without lesions and dental hygiene good   Neck supple and no masses   Cardiovascular: regular rate and rhythm   Respiratory:  no respiratory distress, normal breath sounds   Abdomen: soft, non-tender; bowel sounds normal; no masses,  no organomegaly    Limited OB u/s shows single IUP  Flicker, c/w dates. Assessment:   Pregnancy: G3P1011 Patient Active Problem List   Diagnosis Date Noted  . Supervision of other normal pregnancy, antepartum 10/01/2018  . Stress incontinence of urine  03/15/2017  . Gastroesophageal reflux disease without esophagitis 10/07/2016  . Epistaxis 07/21/2016  . Bilateral carpal tunnel syndrome 11/18/2015  . IBS (irritable bowel syndrome) 05/07/2014  . Raynaud disease 05/07/2014  . Depression with anxiety 05/07/2014     Plan:  1. Supervision of other normal pregnancy, antepartum New OB labs done - Genetic Screening - Enroll Patient in Babyscripts - Cervicovaginal ancillary only( [place]) - Babyscripts Schedule Optimization - Obstetric Panel, Including HIV - Culture, OB Urine   Initial labs drawn. Continue prenatal vitamins. Genetic Screening discussed, NIPS: ordered. Ultrasound discussed; fetal anatomic survey: will need at 18 wks. Problem list reviewed and updated. The nature [facility] with multiple MDs and other Advanced Practice Providers was explained to patient; also emphasized that residents, students are part of our team. Routine obstetric precautions reviewed. Return in 4 weeks (on 10/29/2018).

## 2018-10-03 LAB — CERVICOVAGINAL ANCILLARY ONLY
Chlamydia: NEGATIVE
Neisseria Gonorrhea: NEGATIVE

## 2018-10-04 ENCOUNTER — Encounter: Payer: BC Managed Care – PPO | Admitting: Physical Therapy

## 2018-10-04 LAB — URINE CULTURE, OB REFLEX

## 2018-10-04 LAB — CULTURE, OB URINE

## 2018-10-09 ENCOUNTER — Telehealth: Payer: Self-pay | Admitting: Radiology

## 2018-10-09 ENCOUNTER — Encounter: Payer: Self-pay | Admitting: Radiology

## 2018-10-09 NOTE — Telephone Encounter (Signed)
Left message for patient to call [facility] for Panorama results  

## 2018-10-09 NOTE — Telephone Encounter (Signed)
Patient was informed of Panorama results  

## 2018-10-11 ENCOUNTER — Encounter: Payer: BC Managed Care – PPO | Admitting: Physical Therapy

## 2018-10-23 ENCOUNTER — Telehealth: Payer: Self-pay | Admitting: Family Medicine

## 2018-10-23 NOTE — Telephone Encounter (Signed)
Patient notified of [facility] results. She is not interested in partner testing.

## 2018-10-29 ENCOUNTER — Ambulatory Visit (INDEPENDENT_AMBULATORY_CARE_PROVIDER_SITE_OTHER): Payer: BC Managed Care – PPO | Admitting: Obstetrics & Gynecology

## 2018-10-29 ENCOUNTER — Encounter: Payer: Self-pay | Admitting: Obstetrics & Gynecology

## 2018-10-29 ENCOUNTER — Other Ambulatory Visit: Payer: Self-pay

## 2018-10-29 VITALS — BP 99/63 | HR 72 | Wt 129.0 lb

## 2018-10-29 DIAGNOSIS — Z3480 Encounter for supervision of other normal pregnancy, unspecified trimester: Secondary | ICD-10-CM

## 2018-10-29 DIAGNOSIS — Z3A14 14 weeks gestation of pregnancy: Secondary | ICD-10-CM

## 2018-10-29 DIAGNOSIS — Z3482 Encounter for supervision of other normal pregnancy, second trimester: Secondary | ICD-10-CM

## 2018-10-29 DIAGNOSIS — F418 Other specified anxiety disorders: Secondary | ICD-10-CM

## 2018-10-29 NOTE — Patient Instructions (Signed)

## 2018-10-29 NOTE — Progress Notes (Signed)
   PRENATAL VISIT NOTE  Subjective:  Angela [name] is a 33 y.o. G3P1011 at [redacted]w[redacted]d being seen today for ongoing prenatal care.  She is currently monitored for the following issues for this low-risk pregnancy and has IBS (irritable bowel syndrome); Raynaud disease; Depression with anxiety; Bilateral carpal tunnel syndrome; Epistaxis; Gastroesophageal reflux disease without esophagitis; Stress incontinence of urine; and Supervision of other normal pregnancy, antepartum on their problem list.  Patient reports sciatic nerve pain. Denies leaking of fluid. No bleeding or LOF or pain in abd.   The following portions of the patient's history were reviewed and updated as appropriate: allergies, current medications, past family history, past medical history, past social history, past surgical history and problem list.   Objective:  BP 99/63   Pulse 72   Wt 129 lb (58.5 kg)   LMP 07/20/2018 (Exact Date)   BMI 23.59 kg/m    Fetal Status:           General:  Alert, oriented and cooperative. Patient is in no acute distress.  Skin: Skin is warm and dry. No rash noted.   Cardiovascular: Normal heart rate noted  Respiratory: Normal respiratory effort, no problems with respiration noted  Abdomen: Soft, gravid, appropriate for gestational age.        Pelvic: Cervical exam deferred        Extremities: Normal range of motion.     Mental Status: Normal mood and affect. Normal behavior. Normal judgment and thought content.   Assessment and Plan:  Pregnancy: G3P1011 at [redacted]w[redacted]d 1. Supervision of other normal pregnancy, antepartum AFP next visit [place] for anatomy in 4 week F/u shot next visit   2. Depression with anxiety  Preterm labor symptoms and general obstetric precautions including but not limited to vaginal bleeding, contractions, leaking of fluid and fetal movement were reviewed in detail with the patient. Please refer to After Visit Summary for other counseling recommendations.   Return in about  4 weeks (around 11/26/2018) for in person.  No future appointments.  Lavonia Drafts, MD

## 2018-11-09 ENCOUNTER — Other Ambulatory Visit: Payer: Self-pay | Admitting: Family Medicine

## 2018-11-26 ENCOUNTER — Ambulatory Visit (INDEPENDENT_AMBULATORY_CARE_PROVIDER_SITE_OTHER): Payer: BC Managed Care – PPO | Admitting: Family Medicine

## 2018-11-26 ENCOUNTER — Encounter: Payer: Self-pay | Admitting: Family Medicine

## 2018-11-26 ENCOUNTER — Other Ambulatory Visit: Payer: Self-pay

## 2018-11-26 VITALS — BP 107/70 | HR 80 | Wt 133.0 lb

## 2018-11-26 DIAGNOSIS — Z3A18 18 weeks gestation of pregnancy: Secondary | ICD-10-CM

## 2018-11-26 DIAGNOSIS — Z23 Encounter for immunization: Secondary | ICD-10-CM

## 2018-11-26 DIAGNOSIS — Z3480 Encounter for supervision of other normal pregnancy, unspecified trimester: Secondary | ICD-10-CM

## 2018-11-26 DIAGNOSIS — Z3482 Encounter for supervision of other normal pregnancy, second trimester: Secondary | ICD-10-CM

## 2018-11-26 NOTE — Patient Instructions (Addendum)
 Breastfeeding  Choosing to breastfeed is one of the best decisions you can make for yourself and your baby. A change in hormones during pregnancy causes your breasts to make breast milk in your milk-producing glands. Hormones prevent breast milk from being released before your baby is born. They also prompt milk flow after birth. Once breastfeeding has begun, thoughts of your baby, as well as his or her sucking or crying, can stimulate the release of milk from your milk-producing glands. Benefits of breastfeeding Research shows that breastfeeding offers many health benefits for infants and mothers. It also offers a cost-free and convenient way to feed your baby. For your baby  Your first milk (colostrum) helps your baby's digestive system to function better.  Special cells in your milk (antibodies) help your baby to fight off infections.  Breastfed babies are less likely to develop asthma, allergies, obesity, or type 2 diabetes. They are also at lower risk for sudden infant death syndrome (SIDS).  Nutrients in breast milk are better able to meet your baby's needs compared to infant formula.  Breast milk improves your baby's brain development. For you  Breastfeeding helps to create a very special bond between you and your baby.  Breastfeeding is convenient. Breast milk costs nothing and is always available at the correct temperature.  Breastfeeding helps to burn calories. It helps you to lose the weight that you gained during pregnancy.  Breastfeeding makes your uterus return faster to its size before pregnancy. It also slows bleeding (lochia) after you give birth.  Breastfeeding helps to lower your risk of developing type 2 diabetes, osteoporosis, rheumatoid arthritis, cardiovascular disease, and breast, ovarian, uterine, and endometrial cancer later in life. Breastfeeding basics Starting breastfeeding  Find a comfortable place to sit or lie down, with your neck and back  well-supported.  Place a pillow or a rolled-up blanket under your baby to bring him or her to the level of your breast (if you are seated). Nursing pillows are specially designed to help support your arms and your baby while you breastfeed.  Make sure that your baby's tummy (abdomen) is facing your abdomen.  Gently massage your breast. With your fingertips, massage from the outer edges of your breast inward toward the nipple. This encourages milk flow. If your milk flows slowly, you may need to continue this action during the feeding.  Support your breast with 4 fingers underneath and your thumb above your nipple (make the letter "C" with your hand). Make sure your fingers are well away from your nipple and your baby's mouth.  Stroke your baby's lips gently with your finger or nipple.  When your baby's mouth is open wide enough, quickly bring your baby to your breast, placing your entire nipple and as much of the areola as possible into your baby's mouth. The areola is the colored area around your nipple. ? More areola should be visible above your baby's upper lip than below the lower lip. ? Your baby's lips should be opened and extended outward (flanged) to ensure an adequate, comfortable latch. ? Your baby's tongue should be between his or her lower gum and your breast.  Make sure that your baby's mouth is correctly positioned around your nipple (latched). Your baby's lips should create a seal on your breast and be turned out (everted).  It is common for your baby to suck about 2-3 minutes in order to start the flow of breast milk. Latching Teaching your baby how to latch onto your breast properly   is very important. An improper latch can cause nipple pain, decreased milk supply, and poor weight gain in your baby. Also, if your baby is not latched onto your nipple properly, he or she may swallow some air during feeding. This can make your baby fussy. Burping your baby when you switch breasts  during the feeding can help to get rid of the air. However, teaching your baby to latch on properly is still the best way to prevent fussiness from swallowing air while breastfeeding. Signs that your baby has successfully latched onto your nipple  Silent tugging or silent sucking, without causing you pain. Infant's lips should be extended outward (flanged).  Swallowing heard between every 3-4 sucks once your milk has started to flow (after your let-down milk reflex occurs).  Muscle movement above and in front of his or her ears while sucking. Signs that your baby has not successfully latched onto your nipple  Sucking sounds or smacking sounds from your baby while breastfeeding.  Nipple pain. If you think your baby has not latched on correctly, slip your finger into the corner of your baby's mouth to break the suction and place it between your baby's gums. Attempt to start breastfeeding again. Signs of successful breastfeeding Signs from your baby  Your baby will gradually decrease the number of sucks or will completely stop sucking.  Your baby will fall asleep.  Your baby's body will relax.  Your baby will retain a small amount of milk in his or her mouth.  Your baby will let go of your breast by himself or herself. Signs from you  Breasts that have increased in firmness, weight, and size 1-3 hours after feeding.  Breasts that are softer immediately after breastfeeding.  Increased milk volume, as well as a change in milk consistency and color by the fifth day of breastfeeding.  Nipples that are not sore, cracked, or bleeding. Signs that your baby is getting enough milk  Wetting at least 1-2 diapers during the first 24 hours after birth.  Wetting at least 5-6 diapers every 24 hours for the first week after birth. The urine should be clear or pale yellow by the age of 5 days.  Wetting 6-8 diapers every 24 hours as your baby continues to grow and develop.  At least 3 stools in  a 24-hour period by the age of 5 days. The stool should be soft and yellow.  At least 3 stools in a 24-hour period by the age of 7 days. The stool should be seedy and yellow.  No loss of weight greater than 10% of birth weight during the first 3 days of life.  Average weight gain of 4-7 oz (113-198 g) per week after the age of 4 days.  Consistent daily weight gain by the age of 5 days, without weight loss after the age of 2 weeks. After a feeding, your baby may spit up a small amount of milk. This is normal. Breastfeeding frequency and duration Frequent feeding will help you make more milk and can prevent sore nipples and extremely full breasts (breast engorgement). Breastfeed when you feel the need to reduce the fullness of your breasts or when your baby shows signs of hunger. This is called "breastfeeding on demand." Signs that your baby is hungry include:  Increased alertness, activity, or restlessness.  Movement of the head from side to side.  Opening of the mouth when the corner of the mouth or cheek is stroked (rooting).  Increased sucking sounds, smacking lips,   cooing, sighing, or squeaking.  Hand-to-mouth movements and sucking on fingers or hands.  Fussing or crying. Avoid introducing a pacifier to your baby in the first 4-6 weeks after your baby is born. After this time, you may choose to use a pacifier. Research has shown that pacifier use during the first year of a baby's life decreases the risk of sudden infant death syndrome (SIDS). Allow your baby to feed on each breast as long as he or she wants. When your baby unlatches or falls asleep while feeding from the first breast, offer the second breast. Because newborns are often sleepy in the first few weeks of life, you may need to awaken your baby to get him or her to feed. Breastfeeding times will vary from baby to baby. However, the following rules can serve as a guide to help you make sure that your baby is properly fed:   Newborns (babies 4 weeks of age or younger) may breastfeed every 1-3 hours.  Newborns should not go without breastfeeding for longer than 3 hours during the day or 5 hours during the night.  You should breastfeed your baby a minimum of 8 times in a 24-hour period. Breast milk pumping     Pumping and storing breast milk allows you to make sure that your baby is exclusively fed your breast milk, even at times when you are unable to breastfeed. This is especially important if you go back to work while you are still breastfeeding, or if you are not able to be present during feedings. Your lactation consultant can help you find a method of pumping that works best for you and give you guidelines about how long it is safe to store breast milk. Caring for your breasts while you breastfeed Nipples can become dry, cracked, and sore while breastfeeding. The following recommendations can help keep your breasts moisturized and healthy:  Avoid using soap on your nipples.  Wear a supportive bra designed especially for nursing. Avoid wearing underwire-style bras or extremely tight bras (sports bras).  Air-dry your nipples for 3-4 minutes after each feeding.  Use only cotton bra pads to absorb leaked breast milk. Leaking of breast milk between feedings is normal.  Use lanolin on your nipples after breastfeeding. Lanolin helps to maintain your skin's normal moisture barrier. Pure lanolin is not harmful (not toxic) to your baby. You may also hand express a few drops of breast milk and gently massage that milk into your nipples and allow the milk to air-dry. In the first few weeks after giving birth, some women experience breast engorgement. Engorgement can make your breasts feel heavy, warm, and tender to the touch. Engorgement peaks within 3-5 days after you give birth. The following recommendations can help to ease engorgement:  Completely empty your breasts while breastfeeding or pumping. You may want to  start by applying warm, moist heat (in the shower or with warm, water-soaked hand towels) just before feeding or pumping. This increases circulation and helps the milk flow. If your baby does not completely empty your breasts while breastfeeding, pump any extra milk after he or she is finished.  Apply ice packs to your breasts immediately after breastfeeding or pumping, unless this is too uncomfortable for you. To do this: ? Put ice in a plastic bag. ? Place a towel between your skin and the bag. ? Leave the ice on for 20 minutes, 2-3 times a day.  Make sure that your baby is latched on and positioned properly while breastfeeding.   If engorgement persists after 48 hours of following these recommendations, contact your health care provider or a Advertising copywriterlactation consultant. Overall health care recommendations while breastfeeding  Eat 3 healthy meals and 3 snacks every day. Well-nourished mothers who are breastfeeding need an additional 450-500 calories a day. You can meet this requirement by increasing the amount of a balanced diet that you eat.  Drink enough water to keep your urine pale yellow or clear.  Rest often, relax, and continue to take your prenatal vitamins to prevent fatigue, stress, and low vitamin and mineral levels in your body (nutrient deficiencies).  Do not use any products that contain nicotine or tobacco, such as cigarettes and e-cigarettes. Your baby may be harmed by chemicals from cigarettes that pass into breast milk and exposure to secondhand smoke. If you need help quitting, ask your health care provider.  Avoid alcohol.  Do not use illegal drugs or marijuana.  Talk with your health care provider before taking any medicines. These include over-the-counter and prescription medicines as well as vitamins and herbal supplements. Some medicines that may be harmful to your baby can pass through breast milk.  It is possible to become pregnant while breastfeeding. If birth control is  desired, ask your health care provider about options that will be safe while breastfeeding your baby. Where to find more information: [employer]: www.llli.org Contact a health care provider if:  You feel like you want to stop breastfeeding or have become frustrated with breastfeeding.  Your nipples are cracked or bleeding.  Your breasts are red, tender, or warm.  You have: ? Painful breasts or nipples. ? A swollen area on either breast. ? A fever or chills. ? Nausea or vomiting. ? Drainage other than breast milk from your nipples.  Your breasts do not become full before feedings by the fifth day after you give birth.  You feel sad and depressed.  Your baby is: ? Too sleepy to eat well. ? Having trouble sleeping. ? More than 631 week old and wetting fewer than 6 diapers in a 24-hour period. ? Not gaining weight by 375 days of age.  Your baby has fewer than 3 stools in a 24-hour period.  Your baby's skin or the white parts of his or her eyes become yellow. Get help right away if:  Your baby is overly tired (lethargic) and does not want to wake up and feed.  Your baby develops an unexplained fever. Summary  Breastfeeding offers many health benefits for infant and mothers.  Try to breastfeed your infant when he or she shows early signs of hunger.  Gently tickle or stroke your baby's lips with your finger or nipple to allow the baby to open his or her mouth. Bring the baby to your breast. Make sure that much of the areola is in your baby's mouth. Offer one side and burp the baby before you offer the other side.  Talk with your health care provider or lactation consultant if you have questions or you face problems as you breastfeed. This information is not intended to replace advice given to you by your health care provider. Make sure you discuss any questions you have with your health care provider. Document Released: 01/31/2005 Document Revised: 04/27/2017  Document Reviewed: 03/04/2016 Elsevier Patient Education  2020 Else[employer] Inc.  Sciatica  Sciatica is pain, weakness, tingling, or loss of feeling (numbness) along the sciatic nerve. The sciatic nerve starts in the lower back and goes down the back of each  leg. Sciatica usually goes away on its own or with treatment. Sometimes, sciatica may come back (recur). What are the causes? This condition happens when the sciatic nerve is pinched or has pressure put on it. This may be the result of:  A disk in between the bones of the spine bulging out too far (herniated disk).  Changes in the spinal disks that occur with aging.  A condition that affects a muscle in the butt.  Extra bone growth near the sciatic nerve.  A break (fracture) of the area between your hip bones (pelvis).  Pregnancy.  Tumor. This is rare. What increases the risk? You are more likely to develop this condition if you:  Play sports that put pressure or stress on the spine.  Have poor strength and ease of movement (flexibility).  Have had a back injury in the past.  Have had back surgery.  Sit for long periods of time.  Do activities that involve bending or lifting over and over again.  Are very overweight (obese). What are the signs or symptoms? Symptoms can vary from mild to very bad. They may include:  Any of these problems in the lower back, leg, hip, or butt: ? Mild tingling, loss of feeling, or dull aches. ? Burning sensations. ? Sharp pains.  Loss of feeling in the back of the calf or the sole of the foot.  Leg weakness.  Very bad back pain that makes it hard to move. These symptoms may get worse when you cough, sneeze, or laugh. They may also get worse when you sit or stand for long periods of time. How is this treated? This condition often gets better without any treatment. However, treatment may include:  Changing or cutting back on physical activity when you have pain.  Doing exercises  and stretching.  Putting ice or heat on the affected area.  Medicines that help: ? To relieve pain and swelling. ? To relax your muscles.  Shots (injections) of medicines that help to relieve pain, irritation, and swelling.  Surgery. Follow these instructions at home: Medicines  Take over-the-counter and prescription medicines only as told by your doctor.  Ask your doctor if the medicine prescribed to you: ? Requires you to avoid driving or using heavy machinery. ? Can cause trouble pooping (constipation). You may need to take these steps to prevent or treat trouble pooping:  Drink enough fluids to keep your pee (urine) pale yellow.  Take over-the-counter or prescription medicines.  Eat foods that are high in fiber. These include beans, whole grains, and fresh fruits and vegetables.  Limit foods that are high in fat and sugar. These include fried or sweet foods. Managing pain      If told, put ice on the affected area. ? Put ice in a plastic bag. ? Place a towel between your skin and the bag. ? Leave the ice on for 20 minutes, 2-3 times a day.  If told, put heat on the affected area. Use the heat source that your doctor tells you to use, such as a moist heat pack or a heating pad. ? Place a towel between your skin and the heat source. ? Leave the heat on for 20-30 minutes. ? Remove the heat if your skin turns bright red. This is very important if you are unable to feel pain, heat, or cold. You may have a greater risk of getting burned. Activity   Return to your normal activities as told by your doctor. Ask your  doctor what activities are safe for you.  Avoid activities that make your symptoms worse.  Take short rests during the day. ? When you rest for a long time, do some physical activity or stretching between periods of rest. ? Avoid sitting for a long time without moving. Get up and move around at least one time each hour.  Exercise and stretch regularly, as  told by your doctor.  Do not lift anything that is heavier than 10 lb (4.5 kg) while you have symptoms of sciatica. ? Avoid lifting heavy things even when you do not have symptoms. ? Avoid lifting heavy things over and over.  When you lift objects, always lift in a way that is safe for your body. To do this, you should: ? Bend your knees. ? Keep the object close to your body. ? Avoid twisting. General instructions  Stay at a healthy weight.  Wear comfortable shoes that support your feet. Avoid wearing high heels.  Avoid sleeping on a mattress that is too soft or too hard. You might have less pain if you sleep on a mattress that is firm enough to support your back.  Keep all follow-up visits as told by your doctor. This is important. Contact a doctor if:  You have pain that: ? Wakes you up when you are sleeping. ? Gets worse when you lie down. ? Is worse than the pain you have had in the past. ? Lasts longer than 4 weeks.  You lose weight without trying. Get help right away if:  You cannot control when you pee (urinate) or poop (have a bowel movement).  You have weakness in any of these areas and it gets worse: ? Lower back. ? The area between your hip bones. ? Butt. ? Legs.  You have redness or swelling of your back.  You have a burning feeling when you pee. Summary  Sciatica is pain, weakness, tingling, or loss of feeling (numbness) along the sciatic nerve.  This condition happens when the sciatic nerve is pinched or has pressure put on it.  Sciatica can cause pain, tingling, or loss of feeling (numbness) in the lower back, legs, hips, and butt.  Treatment often includes rest, exercise, medicines, and putting ice or heat on the affected area. This information is not intended to replace advice given to you by your health care provider. Make sure you discuss any questions you have with your health care provider. Document Released: 11/10/2007 Document Revised:  02/19/2018 Document Reviewed: 02/19/2018 Elsevier Patient Education  [place].

## 2018-11-26 NOTE — Progress Notes (Signed)
    PRENATAL VISIT NOTE  Subjective:  Angela [name] is a 33 y.o. G3P1011 at [redacted]w[redacted]d being seen today for ongoing prenatal care.  She is currently monitored for the following issues for this low-risk pregnancy and has IBS (irritable bowel syndrome); Raynaud disease; Depression with anxiety; Bilateral carpal tunnel syndrome; Epistaxis; Gastroesophageal reflux disease without esophagitis; Stress incontinence of urine; and Supervision of other normal pregnancy, antepartum on their problem list.  Patient reports sciatica. Also having low blood sugar episodes..  Contractions: Not present. Vag. Bleeding: None.  Movement: Present. Denies leaking of fluid.   The following portions of the patient's history were reviewed and updated as appropriate: allergies, current medications, past family history, past medical history, past social history, past surgical history and problem list.   Objective:   Vitals:   11/26/18 1612  BP: 107/70  Pulse: 80  Weight: 133 lb (60.3 kg)    Fetal Status: Fetal Heart Rate (bpm): 150   Movement: Present     General:  Alert, oriented and cooperative. Patient is in no acute distress.  Skin: Skin is warm and dry. No rash noted.   Cardiovascular: Normal heart rate noted  Respiratory: Normal respiratory effort, no problems with respiration noted  Abdomen: Soft, gravid, appropriate for gestational age.  Pain/Pressure: Absent     Pelvic: Cervical exam deferred        Extremities: Normal range of motion.  Edema: None  Mental Status: Normal mood and affect. Normal behavior. Normal judgment and thought content.   Assessment and Plan:  Pregnancy: G3P1011 at [redacted]w[redacted]d 1. Supervision of other normal pregnancy, antepartum AFP today - AFP, Serum, Open Spina Bifida Sciatica exercises, bring pillow for chair at work How to stay safe from [place] reviewed Protein with all meals  General obstetric precautions including but not limited to vaginal bleeding, contractions, leaking of  fluid and fetal movement were reviewed in detail with the patient. Please refer to After Visit Summary for other counseling recommendations.   Return in 4 weeks (on 12/24/2018).  Future Appointments  Date Time Provider [place]  11/30/2018  2:30 PM [facility] [place] 1 [facility]  12/24/2018  4:00 PM Donnamae Jude, MD [facility]    Donnamae Jude, MD

## 2018-11-26 NOTE — Progress Notes (Signed)
Having issues with sciatic pain

## 2018-11-28 LAB — AFP, SERUM, OPEN SPINA BIFIDA
AFP MoM: 1.6
AFP Value: 77.1 ng/mL
Gest. Age on Collection Date: 18.2 weeks
Maternal Age At EDD: 32.3 yr
OSBR Risk 1 IN: 2100
Test Results:: NEGATIVE
Weight: 133 [lb_av]

## 2018-11-30 ENCOUNTER — Other Ambulatory Visit: Payer: Self-pay

## 2018-11-30 ENCOUNTER — Other Ambulatory Visit (HOSPITAL_COMMUNITY): Payer: Self-pay | Admitting: *Deleted

## 2018-11-30 ENCOUNTER — Ambulatory Visit (HOSPITAL_COMMUNITY)
Admission: RE | Admit: 2018-11-30 | Discharge: 2018-11-30 | Disposition: A | Discharge status: Home / Self Care | Payer: BC Managed Care – PPO | Source: Ambulatory Visit | Attending: Obstetrics and Gynecology | Admitting: Obstetrics and Gynecology

## 2018-11-30 ENCOUNTER — Other Ambulatory Visit: Payer: Self-pay | Admitting: Obstetrics & Gynecology

## 2018-11-30 DIAGNOSIS — O4442 Low lying placenta NOS or without hemorrhage, second trimester: Secondary | ICD-10-CM | POA: Diagnosis not present

## 2018-11-30 DIAGNOSIS — O359XX0 Maternal care for (suspected) fetal abnormality and damage, unspecified, not applicable or unspecified: Secondary | ICD-10-CM | POA: Diagnosis not present

## 2018-11-30 DIAGNOSIS — O4440 Low lying placenta NOS or without hemorrhage, unspecified trimester: Secondary | ICD-10-CM

## 2018-11-30 DIAGNOSIS — Z3480 Encounter for supervision of other normal pregnancy, unspecified trimester: Secondary | ICD-10-CM | POA: Insufficient documentation

## 2018-11-30 DIAGNOSIS — O09292 Supervision of pregnancy with other poor reproductive or obstetric history, second trimester: Secondary | ICD-10-CM | POA: Diagnosis not present

## 2018-11-30 DIAGNOSIS — Z3A19 19 weeks gestation of pregnancy: Secondary | ICD-10-CM

## 2018-12-24 ENCOUNTER — Other Ambulatory Visit: Payer: Self-pay

## 2018-12-24 ENCOUNTER — Ambulatory Visit (INDEPENDENT_AMBULATORY_CARE_PROVIDER_SITE_OTHER): Payer: BC Managed Care – PPO | Admitting: Family Medicine

## 2018-12-24 VITALS — BP 98/65 | HR 76 | Wt 140.0 lb

## 2018-12-24 DIAGNOSIS — Z3480 Encounter for supervision of other normal pregnancy, unspecified trimester: Secondary | ICD-10-CM

## 2018-12-24 DIAGNOSIS — Z3482 Encounter for supervision of other normal pregnancy, second trimester: Secondary | ICD-10-CM

## 2018-12-24 DIAGNOSIS — Z3A22 22 weeks gestation of pregnancy: Secondary | ICD-10-CM

## 2018-12-24 NOTE — Progress Notes (Signed)
Pt complains of having cramping and lower pelvic pain.

## 2018-12-24 NOTE — Progress Notes (Signed)
   PRENATAL VISIT NOTE  Subjective:  Angela [name] is a 33 y.o. G3P1011 at [redacted]w[redacted]d being seen today for ongoing prenatal care.  She is currently monitored for the following issues for this low-risk pregnancy and has IBS (irritable bowel syndrome); Raynaud disease; Depression with anxiety; Bilateral carpal tunnel syndrome; Epistaxis; Gastroesophageal reflux disease without esophagitis; Stress incontinence of urine; and Supervision of other normal pregnancy, antepartum on their problem list.  Patient reports no complaints.  Contractions: Irritability. Vag. Bleeding: None.  Movement: Present. Denies leaking of fluid.   The following portions of the patient's history were reviewed and updated as appropriate: allergies, current medications, past family history, past medical history, past social history, past surgical history and problem list.   Objective:   Vitals:   12/24/18 1603  BP: 98/65  Pulse: 76  Weight: 140 lb (63.5 kg)    Fetal Status: Fetal Heart Rate (bpm): 154 Fundal Height: 22 cm Movement: Present     General:  Alert, oriented and cooperative. Patient is in no acute distress.  Skin: Skin is warm and dry. No rash noted.   Cardiovascular: Normal heart rate noted  Respiratory: Normal respiratory effort, no problems with respiration noted  Abdomen: Soft, gravid, appropriate for gestational age.  Pain/Pressure: Present     Pelvic: Cervical exam deferred        Extremities: Normal range of motion.  Edema: None  Mental Status: Normal mood and affect. Normal behavior. Normal judgment and thought content.   Assessment and Plan:  Pregnancy: G3P1011 at [redacted]w[redacted]d 1. Supervision of other normal pregnancy, antepartum Some minor cramping Discussed low lying placenta  28 wks and TDaP next.  Preterm labor symptoms and general obstetric precautions including but not limited to vaginal bleeding, contractions, leaking of fluid and fetal movement were reviewed in detail with the patient. Please  refer to After Visit Summary for other counseling recommendations.   Return in 5 weeks (on 01/28/2019) for 28 wk labs, in person.  Future Appointments  Date Time Provider [place]  01/25/2019  3:45 PM [facility] [place] 2 [facility]US [facility]-US    Donnamae Jude, MD

## 2018-12-24 NOTE — Patient Instructions (Signed)

## 2019-01-04 DIAGNOSIS — Z1159 Encounter for screening for other viral diseases: Secondary | ICD-10-CM | POA: Diagnosis not present

## 2019-01-04 DIAGNOSIS — Z03818 Encounter for observation for suspected exposure to other biological agents ruled out: Secondary | ICD-10-CM | POA: Diagnosis not present

## 2019-01-25 ENCOUNTER — Ambulatory Visit (HOSPITAL_COMMUNITY)
Admission: RE | Admit: 2019-01-25 | Discharge: 2019-01-25 | Disposition: A | Discharge status: Home / Self Care | Payer: BC Managed Care – PPO | Source: Ambulatory Visit | Attending: Obstetrics and Gynecology | Admitting: Obstetrics and Gynecology

## 2019-01-25 ENCOUNTER — Other Ambulatory Visit: Payer: Self-pay

## 2019-01-25 DIAGNOSIS — O4440 Low lying placenta NOS or without hemorrhage, unspecified trimester: Secondary | ICD-10-CM | POA: Diagnosis not present

## 2019-01-25 DIAGNOSIS — O4442 Low lying placenta NOS or without hemorrhage, second trimester: Secondary | ICD-10-CM

## 2019-01-25 DIAGNOSIS — Z3A27 27 weeks gestation of pregnancy: Secondary | ICD-10-CM

## 2019-01-25 DIAGNOSIS — O09292 Supervision of pregnancy with other poor reproductive or obstetric history, second trimester: Secondary | ICD-10-CM | POA: Diagnosis not present

## 2019-01-25 DIAGNOSIS — Z362 Encounter for other antenatal screening follow-up: Secondary | ICD-10-CM | POA: Diagnosis not present

## 2019-01-25 DIAGNOSIS — O359XX0 Maternal care for (suspected) fetal abnormality and damage, unspecified, not applicable or unspecified: Secondary | ICD-10-CM

## 2019-01-28 ENCOUNTER — Other Ambulatory Visit (HOSPITAL_COMMUNITY): Payer: Self-pay | Admitting: *Deleted

## 2019-01-28 DIAGNOSIS — O4443 Low lying placenta NOS or without hemorrhage, third trimester: Secondary | ICD-10-CM

## 2019-01-29 ENCOUNTER — Other Ambulatory Visit: Payer: Self-pay

## 2019-01-29 ENCOUNTER — Ambulatory Visit (INDEPENDENT_AMBULATORY_CARE_PROVIDER_SITE_OTHER): Payer: BC Managed Care – PPO | Admitting: Obstetrics & Gynecology

## 2019-01-29 VITALS — BP 114/72 | HR 72 | Wt 146.6 lb

## 2019-01-29 DIAGNOSIS — M549 Dorsalgia, unspecified: Secondary | ICD-10-CM

## 2019-01-29 DIAGNOSIS — Z3480 Encounter for supervision of other normal pregnancy, unspecified trimester: Secondary | ICD-10-CM

## 2019-01-29 DIAGNOSIS — O4443 Low lying placenta NOS or without hemorrhage, third trimester: Secondary | ICD-10-CM | POA: Insufficient documentation

## 2019-01-29 DIAGNOSIS — Z3A27 27 weeks gestation of pregnancy: Secondary | ICD-10-CM

## 2019-01-29 DIAGNOSIS — O4442 Low lying placenta NOS or without hemorrhage, second trimester: Secondary | ICD-10-CM

## 2019-01-29 DIAGNOSIS — O99891 Other specified diseases and conditions complicating pregnancy: Secondary | ICD-10-CM

## 2019-01-29 HISTORY — DX: Low lying placenta nos or without hemorrhage, third trimester: O44.43

## 2019-01-29 MED ORDER — CYCLOBENZAPRINE HCL 10 MG PO TABS: 10.0000 mg | ORAL_TABLET | Freq: Three times a day (TID) | ORAL | 1 refills | Status: DC | PRN

## 2019-01-29 NOTE — Patient Instructions (Addendum)
Return to office for any scheduled appointments. Call the office or go to the MAU at [place] at [facility] if:  You begin to have strong, frequent contractions  Your water breaks.  Sometimes it is a big gush of fluid, sometimes it is just a trickle that keeps getting your panties wet or running down your legs  You have vaginal bleeding.  It is normal to have a small amount of spotting if your cervix was checked.   You do not feel your baby moving like normal.  If you do not, get something to eat and drink and lay down and focus on feeling your baby move.   If your baby is still not moving like normal, you should call the office or go to MAU.  Any other obstetric concerns.    Third Trimester of Pregnancy The third trimester is from week 28 through week 40 (months 7 through 9). The third trimester is a time when the unborn baby (fetus) is growing rapidly. At the end of the ninth month, the fetus is about 20 inches in length and weighs 6-10 pounds. Body changes during your third trimester Your body will continue to go through many changes during pregnancy. The changes vary from woman to woman. During the third trimester:  Your weight will continue to increase. You can expect to gain 25-35 pounds (11-16 kg) by the end of the pregnancy.  You may begin to get stretch marks on your hips, abdomen, and breasts.  You may urinate more often because the fetus is moving lower into your pelvis and pressing on your bladder.  You may develop or continue to have heartburn. This is caused by increased hormones that slow down muscles in the digestive tract.  You may develop or continue to have constipation because increased hormones slow digestion and cause the muscles that push waste through your intestines to relax.  You may develop hemorrhoids. These are swollen veins (varicose veins) in the rectum that can itch or be painful.  You may develop swollen, bulging veins (varicose  veins) in your legs.  You may have increased body aches in the pelvis, back, or thighs. This is due to weight gain and increased hormones that are relaxing your joints.  You may have changes in your hair. These can include thickening of your hair, rapid growth, and changes in texture. Some women also have hair loss during or after pregnancy, or hair that feels dry or thin. Your hair will most likely return to normal after your baby is born.  Your breasts will continue to grow and they will continue to become tender. A yellow fluid (colostrum) may leak from your breasts. This is the first milk you are producing for your baby.  Your belly button may stick out.  You may notice more swelling in your hands, face, or ankles.  You may have increased tingling or numbness in your hands, arms, and legs. The skin on your belly may also feel numb.  You may feel short of breath because of your expanding uterus.  You may have more problems sleeping. This can be caused by the size of your belly, increased need to urinate, and an increase in your body's metabolism.  You may notice the fetus "dropping," or moving lower in your abdomen (lightening).  You may have increased vaginal discharge.  You may notice your joints feel loose and you may have pain around your pelvic bone. What to expect at prenatal visits You will  have prenatal exams every 2 weeks until week 36. Then you will have weekly prenatal exams. During a routine prenatal visit:  You will be weighed to make sure you and the baby are growing normally.  Your blood pressure will be taken.  Your abdomen will be measured to track your baby's growth.  The fetal heartbeat will be listened to.  Any test results from the previous visit will be discussed.  You may have a cervical check near your due date to see if your cervix has softened or thinned (effaced).  You will be tested for Group B streptococcus. This happens between 35 and 37  weeks. Your health care provider may ask you:  What your birth plan is.  How you are feeling.  If you are feeling the baby move.  If you have had any abnormal symptoms, such as leaking fluid, bleeding, severe headaches, or abdominal cramping.  If you are using any tobacco products, including cigarettes, chewing tobacco, and electronic cigarettes.  If you have any questions. Other tests or screenings that may be performed during your third trimester include:  Blood tests that check for low iron levels (anemia).  Fetal testing to check the health, activity level, and growth of the fetus. Testing is done if you have certain medical conditions or if there are problems during the pregnancy.  Nonstress test (NST). This test checks the health of your baby to make sure there are no signs of problems, such as the baby not getting enough oxygen. During this test, a belt is placed around your belly. The baby is made to move, and its heart rate is monitored during movement. What is false labor? False labor is a condition in which you feel small, irregular tightenings of the muscles in the womb (contractions) that usually go away with rest, changing position, or drinking water. These are called Braxton Hicks contractions. Contractions may last for hours, days, or even weeks before true labor sets in. If contractions come at regular intervals, become more frequent, increase in intensity, or become painful, you should see your health care provider. What are the signs of labor?  Abdominal cramps.  Regular contractions that start at 10 minutes apart and become stronger and more frequent with time.  Contractions that start on the top of the uterus and spread down to the lower abdomen and back.  Increased pelvic pressure and dull back pain.  A watery or bloody mucus discharge that comes from the vagina.  Leaking of amniotic fluid. This is also known as your "water breaking." It could be a slow  trickle or a gush. Let your health care provider know if it has a color or strange odor. If you have any of these signs, call your health care provider right away, even if it is before your due date. Follow these instructions at home: Medicines  Follow your health care provider's instructions regarding medicine use. Specific medicines may be either safe or unsafe to take during pregnancy.  Take a prenatal vitamin that contains at least 600 micrograms (mcg) of folic acid.  If you develop constipation, try taking a stool softener if your health care provider approves. Eating and drinking   Eat a balanced diet that includes fresh fruits and vegetables, whole grains, good sources of protein such as meat, eggs, or tofu, and low-fat dairy. Your health care provider will help you determine the amount of weight gain that is right for you.  Avoid raw meat and uncooked cheese. These carry germs  that can cause birth defects in the baby.  If you have low calcium intake from food, talk to your health care provider about whether you should take a daily calcium supplement.  Eat four or five small meals rather than three large meals a day.  Limit foods that are high in fat and processed sugars, such as fried and sweet foods.  To prevent constipation: ? Drink enough fluid to keep your urine clear or pale yellow. ? Eat foods that are high in fiber, such as fresh fruits and vegetables, whole grains, and beans. Activity  Exercise only as directed by your health care provider. Most women can continue their usual exercise routine during pregnancy. Try to exercise for 30 minutes at least 5 days a week. Stop exercising if you experience uterine contractions.  Avoid heavy lifting.  Do not exercise in extreme heat or humidity, or at high altitudes.  Wear low-heel, comfortable shoes.  Practice good posture.  You may continue to have sex unless your health care provider tells you otherwise. Relieving pain  and discomfort  Take frequent breaks and rest with your legs elevated if you have leg cramps or low back pain.  Take warm sitz baths to soothe any pain or discomfort caused by hemorrhoids. Use hemorrhoid cream if your health care provider approves.  Wear a good support bra to prevent discomfort from breast tenderness.  If you develop varicose veins: ? Wear support pantyhose or compression stockings as told by your healthcare provider. ? Elevate your feet for 15 minutes, 3-4 times a day. Prenatal care  Write down your questions. Take them to your prenatal visits.  Keep all your prenatal visits as told by your health care provider. This is important. Safety  Wear your seat belt at all times when driving.  Make a list of emergency phone numbers, including numbers for family, friends, the hospital, and police and fire departments. General instructions  Avoid cat litter boxes and soil used by cats. These carry germs that can cause birth defects in the baby. If you have a cat, ask someone to clean the litter box for you.  Do not travel far distances unless it is absolutely necessary and only with the approval of your health care provider.  Do not use hot tubs, steam rooms, or saunas.  Do not drink alcohol.  Do not use any products that contain nicotine or tobacco, such as cigarettes and e-cigarettes. If you need help quitting, ask your health care provider.  Do not use any medicinal herbs or unprescribed drugs. These chemicals affect the formation and growth of the baby.  Do not douche or use tampons or scented sanitary pads.  Do not cross your legs for long periods of time.  To prepare for the arrival of your baby: ? Take prenatal classes to understand, practice, and ask questions about labor and delivery. ? Make a trial run to the hospital. ? Visit the hospital and tour the maternity area. ? Arrange for maternity or paternity leave through employers. ? Arrange for family and  friends to take care of pets while you are in the hospital. ? Purchase a rear-facing car seat and make sure you know how to install it in your car. ? Pack your hospital bag. ? Prepare the baby's nursery. Make sure to remove all pillows and stuffed animals from the baby's crib to prevent suffocation.  Visit your dentist if you have not gone during your pregnancy. Use a soft toothbrush to brush your teeth and  be gentle when you floss. Contact a health care provider if:  You are unsure if you are in labor or if your water has broken.  You become dizzy.  You have mild pelvic cramps, pelvic pressure, or nagging pain in your abdominal area.  You have lower back pain.  You have persistent nausea, vomiting, or diarrhea.  You have an unusual or bad smelling vaginal discharge.  You have pain when you urinate. Get help right away if:  Your water breaks before 37 weeks.  You have regular contractions less than 5 minutes apart before 37 weeks.  You have a fever.  You are leaking fluid from your vagina.  You have spotting or bleeding from your vagina.  You have severe abdominal pain or cramping.  You have rapid weight loss or weight gain.  You have shortness of breath with chest pain.  You notice sudden or extreme swelling of your face, hands, ankles, feet, or legs.  Your baby makes fewer than 10 movements in 2 hours.  You have severe headaches that do not go away when you take medicine.  You have vision changes. Summary  The third trimester is from week 28 through week 40, months 7 through 9. The third trimester is a time when the unborn baby (fetus) is growing rapidly.  During the third trimester, your discomfort may increase as you and your baby continue to gain weight. You may have abdominal, leg, and back pain, sleeping problems, and an increased need to urinate.  During the third trimester your breasts will keep growing and they will continue to become tender. A yellow  fluid (colostrum) may leak from your breasts. This is the first milk you are producing for your baby.  False labor is a condition in which you feel small, irregular tightenings of the muscles in the womb (contractions) that eventually go away. These are called Braxton Hicks contractions. Contractions may last for hours, days, or even weeks before true labor sets in.  Signs of labor can include: abdominal cramps; regular contractions that start at 10 minutes apart and become stronger and more frequent with time; watery or bloody mucus discharge that comes from the vagina; increased pelvic pressure and dull back pain; and leaking of amniotic fluid. This information is not intended to replace advice given to you by your health care provider. Make sure you discuss any questions you have with your health care provider. Document Released: 01/25/2001 Document Revised: 05/24/2018 Document Reviewed: 03/08/2016 Elsevier Patient Education  2020 [employer]    Placenta Previa Placenta previa is a condition in which the placenta implants in the lower part of the uterus in pregnant women. The placenta either partially or completely covers the opening to the cervix. This is a problem because the baby must pass through the cervix during delivery. There are three types of placenta previa:  Marginal placenta previa. The placenta reaches within an inch (2.5 cm) of the cervical opening but does not cover it.  Partial placenta previa. The placenta covers part of the cervical opening.  Complete placenta previa. The placenta covers the entire cervical opening. If the previa is marginal or partial and it is diagnosed in the first half of pregnancy, the placenta may move into a normal position as the pregnancy progresses and may no longer cover the cervix. It is important to keep all prenatal visits with your health care provider so you can be more closely monitored. What are the causes? The cause of this condition  is  not known. What increases the risk? This condition is more likely to develop in women who:  Are carrying more than one baby (multiples).  Have an abnormally shaped uterus.  Have scars on the lining of the uterus.  Have had surgeries involving the uterus, such as a cesarean delivery.  Have delivered a baby before.  Have a history of placenta previa.  Have smoked or used cocaine during pregnancy.  Are age 69 or older during pregnancy. What are the signs or symptoms? The main symptom of this condition is sudden, painless vaginal bleeding during the second half of pregnancy. The amount of bleeding can be very light at first, and it usually stops on its own. Heavier bleeding episodes may also happen. Some women with placenta previa may have no bleeding at all. How is this diagnosed?  This condition is diagnosed: ? From an ultrasound. This test uses sound waves to find where the placenta is located before you have any bleeding episodes. ? During a checkup after vaginal bleeding is noticed.  If you are diagnosed with a partial or complete previa, digital exams with fingers will generally be avoided. Your health care provider will still perform a speculum exam.  If you did not have an ultrasound during your pregnancy, placenta previa may not be diagnosed until bleeding occurs during labor. How is this treated? Treatment for this condition may include:  Decreased activity.  Bed rest at home or in the hospital.  Pelvic rest. Nothing is placed inside the vagina during pelvic rest. This means not having sex and not using tampons or douches.  A blood transfusion to replace blood that you have lost (maternal blood loss).  A cesarean delivery. This may be performed if: ? The bleeding is heavy and cannot be controlled. ? The placenta completely covers the cervix.  Medicines to stop premature labor or to help the baby's lungs to mature. This treatment may be used if you need delivery  before your pregnancy is full-term. Your treatment will be decided based on:  How much you are bleeding, or whether the bleeding has stopped.  How far along you are in your pregnancy.  The condition of your baby.  The type of placenta previa that you have.  Follow these instructions at home:  Get plenty of rest and lessen activity as told by your health care provider.  Stay on bed rest for as long as told by your health care provider.  Do not have sex, use tampons, use a douche, or place anything inside of your vagina if your health care provider recommended pelvic rest.  Take over-the-counter and prescription medicines as told by your health care provider.  Keep all follow-up visits as told by your health care provider. This is important. Get help right away if:  You have vaginal bleeding, even if in small amounts and even if you have no pain.  You have cramping or regular contractions.  You have pain in your abdomen or your lower back.  You have a feeling of increased pressure in your pelvis.  You have increased watery or bloody mucus from the vagina. This information is not intended to replace advice given to you by your health care provider. Make sure you discuss any questions you have with your health care provider. Document Released: 01/31/2005 Document Revised: 01/13/2017 Document Reviewed: 08/15/2015 Elsevier Patient Education  2020 [employer].

## 2019-01-29 NOTE — Progress Notes (Signed)
PRENATAL VISIT NOTE  Subjective:  Angela [name] is a 33 y.o. G3P1011 at 4758w4d being seen today for ongoing prenatal care.  She is currently monitored for the following issues for this high-risk pregnancy and has IBS (irritable bowel syndrome); Raynaud disease; Depression with anxiety; Bilateral carpal tunnel syndrome; Epistaxis; Gastroesophageal reflux disease without esophagitis; Stress incontinence of urine; Supervision of other normal pregnancy, antepartum; and Low-lying placenta in third trimester on their problem list.  Patient reports back pain related to sciatica. Wants to know if she can use topical creams.  Contractions: Irritability.  .  Movement: Present. Denies leaking of fluid.   The following portions of the patient's history were reviewed and updated as appropriate: allergies, current medications, past family history, past medical history, past social history, past surgical history and problem list.   Objective:   Vitals:   01/29/19 0841  BP: 114/72  Pulse: 72  Weight: 146 lb 9.6 oz (66.5 kg)    Fetal Status: Fetal Heart Rate (bpm): 156   Movement: Present     General:  Alert, oriented and cooperative. Patient is in no acute distress.  Skin: Skin is warm and dry. No rash noted.   Cardiovascular: Normal heart rate noted  Respiratory: Normal respiratory effort, no problems with respiration noted  Abdomen: Soft, gravid, appropriate for gestational age.  Pain/Pressure: Present     Pelvic: Cervical exam deferred        Extremities: Normal range of motion.  Edema: None  Mental Status: Normal mood and affect. Normal behavior. Normal judgment and thought content.   Imaging: US MFM OB FOLLOW UP  Result Date: 01/25/2019 ----------------------------------------------------------------------  OBSTETRICS REPORT                       (Signed Final 01/25/2019 04:34 pm) ---------------------------------------------------------------------- Patient Info  ID #:       [id number]                           D.O.B.:  Dec 16, 1985 (33 yrs)  Name:       Angela [name]                Visit Date: 01/25/2019 04:07 pm ---------------------------------------------------------------------- Performed By  Performed By:     Marcellina MillinKelly L Moser          Ref. Address[address]     [address]                    [address]                                                             [address]  Attending:        Ma RingsVictor Fang MD         [facility]  Referred By:  [place] ---------------------------------------------------------------------- Orders   #  Description                          Code         Ordered By   1  [place] MFM OB FOLLOW UP                  09470.96     [place]  ----------------------------------------------------------------------   #  Order #                    Accession #                 Episode #   1  [id number]                  [id number]                  [id number]  ---------------------------------------------------------------------- Indications   Low lying placenta, antepartum                 O44.40   Poor obstetrical history (cholestasis in       O09.299   pregnancy)   Fetal abnormality - other known or             O35.9XX0   suspected (right CPC)   [redacted] weeks gestation of pregnancy                Z3A.27  ---------------------------------------------------------------------- Fetal Evaluation  Num Of Fetuses:         1  Fetal Heart Rate(bpm):  164  Cardiac Activity:       Observed  Presentation:           Cephalic  Placenta:               Posterior, low-lying, 1.3cm from int os  P. Cord Insertion:      Previously Visualized  Amniotic Fluid  AFI FV:      Within normal limits                              Largest Pocket(cm)                              5.9 ---------------------------------------------------------------------- Biometry  BPD:      68.2  mm     G. Age:  27w 3d         78  %    CI:        74.76   %     70 - 86                                                          FL/HC:      19.3   %    18.6 - 20.4  HC:      250.3  mm     G. Age:  27w 1d         28  %    HC/AC:      1.02        1.05 - 1.21  AC:      245.3  mm  G. Age:  28w 5d         89  %    FL/BPD:     71.0   %    71 - 87  FL:       48.4  mm     G. Age:  26w 2d         16  %    FL/AC:      19.7   %    20 - 24  Est. FW:    1111  gm      2 lb 7 oz     66  % ---------------------------------------------------------------------- OB History  Gravidity:    3         Term:   1        Prem:   0        SAB:   1  TOP:          0       Ectopic:  0        Living: 1 ---------------------------------------------------------------------- Gestational Age  LMP:           27w 0d        Date:  07/20/18                 EDD:   04/26/19  U/S Today:     27w 3d                                        EDD:   04/23/19  Best:          27w 0d     Det. By:  LMP  (07/20/18)          EDD:   04/26/19 ---------------------------------------------------------------------- Anatomy  Cranium:               Appears normal         LVOT:                   Appears normal  Cavum:                 Appears normal         Aortic Arch:            Appears normal  Ventricles:            Appears normal         Ductal Arch:            Appears normal  Choroid Plexus:        Resolved Rt CP cyst    Diaphragm:              Appears normal  Cerebellum:            Appears normal         Stomach:                Appears normal, left                                                                        sided  Posterior Fossa:  Appears normal         Abdomen:                Appears normal  Nuchal Fold:           Previously seen        Abdominal Wall:         Previously seen  Face:                  Orbits and profile     Cord Vessels:           Previously seen                         previously seen  Lips:                  Previously seen        Kidneys:                Appear normal  Palate:                 Previously seen        Bladder:                Appears normal  Thoracic:              Appears normal         Spine:                  Previously seen  Heart:                 Appears normal         Upper Extremities:      Previously seen                         (4CH, axis, and                         situs)  RVOT:                  Appears normal         Lower Extremities:      Previously seen  Other:  Heels/feet and open hands/5th digits previously  visualized. Nasal          bone previously visualized. ---------------------------------------------------------------------- Cervix Uterus Adnexa  Cervix  Length:            4.4  cm.  Normal appearance by transabdominal scan. ---------------------------------------------------------------------- Comments  This patient was seen for a follow up growth scan due to a  low-lying placenta noted during her prior ultrasound exams.  She denies any problems since her last exam and denies any  vaginal bleeding.  She was informed that the fetal growth and amniotic fluid  level appears appropriate for her gestational age.  A low-lying placenta measuring 1.3 cm away from the internal  cervical os continues to be noted today.  Precautions  associated with the placenta previa were discussed.  A follow-up exam was scheduled in 6 weeks to assess the  placental location.  The patient was advised that I anticipate  that the placenta previa will most likely have resolved by her  next exam. ----------------------------------------------------------------------                   Ma Rings, MD Electronically Signed Final Report   01/25/2019 04:34 pm ----------------------------------------------------------------------  Assessment and Plan:  Pregnancy: G3P1011 at [redacted]w[redacted]d 1. Low-lying placenta in third trimester 19 weeks: 0.9 cm 27 weeks: 1.3 cm Previa precautions reviewed. Follow up scans as per MFM.  2. Back pain affecting pregnancy in third trimester Flexeril prescribed, can use  topical creams as needed - cyclobenzaprine (FLEXERIL) 10 MG tablet; Take 1 tablet (10 mg total) by mouth every 8 (eight) hours as needed for muscle spasms.  Dispense: 30 tablet; Refill: 1  3. Supervision of other normal pregnancy, antepartum Unable to do labs today as she is not fasting.  Will return for them soon.  - Tdap vaccine greater than or equal to 7yo IM Preterm labor symptoms and general obstetric precautions including but not limited to vaginal bleeding, contractions, leaking of fluid and fetal movement were reviewed in detail with the patient. Please refer to After Visit Summary for other counseling recommendations.   Return for 2 hr GTT, third trimester labs soon.  Next OB visit in 2 weeks: VIRTUAL OB VISIT.  Future Appointments  Date Time Provider Department Center  02/01/2019  8:30 AM [facility]  02/12/2019 11:15 AM Allie Bossier, MD [facility]  03/15/2019  3:45 PM [facility] [place] 2 [facility]  03/15/2019  3:50 PM [facility] NURSE [facility]US    Jaynie Collins, MD

## 2019-02-01 ENCOUNTER — Other Ambulatory Visit: Payer: BC Managed Care – PPO

## 2019-02-01 ENCOUNTER — Other Ambulatory Visit: Payer: Self-pay

## 2019-02-01 DIAGNOSIS — Z3402 Encounter for supervision of normal first pregnancy, second trimester: Secondary | ICD-10-CM | POA: Diagnosis not present

## 2019-02-02 LAB — CBC
Hematocrit: 33.5 % — ABNORMAL LOW (ref 34.0–46.6)
Hemoglobin: 10.7 g/dL — ABNORMAL LOW (ref 11.1–15.9)
MCH: 27 pg (ref 26.6–33.0)
MCHC: 31.9 g/dL (ref 31.5–35.7)
MCV: 84 fL (ref 79–97)
Platelets: 181 10*3/uL (ref 150–450)
RBC: 3.97 x10E6/uL (ref 3.77–5.28)
RDW: 13.5 % (ref 11.7–15.4)
WBC: 6.2 10*3/uL (ref 3.4–10.8)

## 2019-02-02 LAB — GLUCOSE TOLERANCE, 2 HOURS W/ 1HR
Glucose, 1 hour: 144 mg/dL (ref 65–179)
Glucose, 2 hour: 112 mg/dL (ref 65–152)
Glucose, Fasting: 73 mg/dL (ref 65–91)

## 2019-02-02 LAB — HIV ANTIBODY (ROUTINE TESTING W REFLEX): HIV Screen 4th Generation wRfx: NONREACTIVE

## 2019-02-02 LAB — RPR: RPR Ser Ql: NONREACTIVE

## 2019-02-12 ENCOUNTER — Other Ambulatory Visit: Payer: Self-pay

## 2019-02-12 ENCOUNTER — Telehealth (INDEPENDENT_AMBULATORY_CARE_PROVIDER_SITE_OTHER): Payer: BC Managed Care – PPO | Admitting: Obstetrics & Gynecology

## 2019-02-12 VITALS — BP 120/64 | HR 87 | Wt 147.0 lb

## 2019-02-12 DIAGNOSIS — Z3480 Encounter for supervision of other normal pregnancy, unspecified trimester: Secondary | ICD-10-CM

## 2019-02-12 DIAGNOSIS — Z3A29 29 weeks gestation of pregnancy: Secondary | ICD-10-CM

## 2019-02-12 DIAGNOSIS — O4443 Low lying placenta NOS or without hemorrhage, third trimester: Secondary | ICD-10-CM

## 2019-02-12 NOTE — Progress Notes (Signed)
I connected with  Angela [name] on 02/12/19 at 11:15 AM EST by telephone and verified that I am speaking with the correct person using two identifiers.   I discussed the limitations, risks, security and privacy concerns of performing an evaluation and management service by telephone and the availability of in person appointments. I also discussed with the patient that there may be a patient responsible charge related to this service. The patient expressed understanding and agreed to proceed.  Crosby Oyster, RN 02/12/2019  11:04 AM

## 2019-02-12 NOTE — Progress Notes (Signed)
TELEHEALTH OBSTETRICS PRENATAL VIRTUAL VIDEO VISIT ENCOUNTER NOTE  Provider location: [facility]   I connected with Kaeden A Jacuinde on 02/12/19 at 11:15 AM EST by MyChart Video Encounter at home and verified that I am speaking with the correct person using two identifiers.   I discussed the limitations, risks, security and privacy concerns of performing an evaluation and management service virtually and the availability of in person appointments. I also discussed with the patient that there may be a patient responsible charge related to this service. The patient expressed understanding and agreed to proceed. Subjective:  Angela [name] is a 33 y.o. G3P1011 at [redacted]w[redacted]d being seen today for ongoing prenatal care.  She is currently monitored for the following issues for this low-risk pregnancy and has IBS (irritable bowel syndrome); Raynaud disease; Depression with anxiety; Bilateral carpal tunnel syndrome; Epistaxis; Gastroesophageal reflux disease without esophagitis; Stress incontinence of urine; Supervision of other normal pregnancy, antepartum; and Low-lying placenta in third trimester on their problem list.  Patient reports no complaints.  Contractions: Not present. Vag. Bleeding: None.  Movement: Present. Denies any leaking of fluid.   The following portions of the patient's history were reviewed and updated as appropriate: allergies, current medications, past family history, past medical history, past social history, past surgical history and problem list.   Objective:   Vitals:   02/12/19 1102  BP: 120/64  Pulse: 87  Weight: 147 lb (66.7 kg)    Fetal Status:     Movement: Present     General:  Alert, oriented and cooperative. Patient is in no acute distress.  Respiratory: Normal respiratory effort, no problems with respiration noted  Mental Status: Normal mood and affect. Normal behavior. Normal judgment and thought content.  Rest of physical  exam deferred due to type of encounter  Imaging: [place] MFM OB FOLLOW UP  Result Date: 01/25/2019 ----------------------------------------------------------------------  OBSTETRICS REPORT                       (Signed Final 01/25/2019 04:34 pm) ---------------------------------------------------------------------- Patient Info  ID #:       [id number]                          D.O.B.:  10-14-1985 (33 yrs)  Name:       Angela [name]                Visit Date: 01/25/2019 04:07 pm ---------------------------------------------------------------------- Performed By  Performed By:     Marcellina Millin          Ref. Address:     [address]                    [address]                                                             [address]  Attending:        Ma Rings MD         [facility]  [facility] ---------------------------------------------------------------------- Orders   #  Description                          Code         Ordered By   1  US MFM OB FOLLOW UP                  [id number]     Noralee SpaceAVI SHANKAR  ----------------------------------------------------------------------   #  Order #                    Accession #                 Episode #   1  [id number]                  [id number]                  [id number]  ---------------------------------------------------------------------- Indications   Low lying placenta, antepartum                 O44.40   Poor obstetrical history (cholestasis in       O09.299   pregnancy)   Fetal abnormality - other known or             O35.9XX0   suspected (right CPC)   [redacted] weeks gestation of pregnancy                Z3A.27  ---------------------------------------------------------------------- Fetal Evaluation  Num Of Fetuses:         1  Fetal Heart Rate(bpm):  164  Cardiac Activity:       Observed  Presentation:           Cephalic  Placenta:               Posterior,  low-lying, 1.3cm from int os  P. Cord Insertion:      Previously Visualized  Amniotic Fluid  AFI FV:      Within normal limits                              Largest Pocket(cm)                              5.9 ---------------------------------------------------------------------- Biometry  BPD:      68.2  mm     G. Age:  27w 3d         54  %    CI:        74.76   %    70 - 86                                                          FL/HC:      19.3   %    18.6 - 20.4  HC:      250.3  mm     G. Age:  27w 1d         28  %    HC/AC:      1.02        1.05 - 1.21  AC:      245.3  mm     G. Age:  28w 5d         89  %    FL/BPD:     71.0   %    71 - 87  FL:       48.4  mm     G. Age:  26w 2d         16  %    FL/AC:      19.7   %    20 - 24  Est. FW:    1111  gm      2 lb 7 oz     66  % ---------------------------------------------------------------------- OB History  Gravidity:    3         Term:   1        Prem:   0        SAB:   1  TOP:          0       Ectopic:  0        Living: 1 ---------------------------------------------------------------------- Gestational Age  LMP:           27w 0d        Date:  07/20/18                 EDD:   04/26/19  U/S Today:     27w 3d                                        EDD:   04/23/19  Best:          27w 0d     Det. By:  LMP  (07/20/18)          EDD:   04/26/19 ---------------------------------------------------------------------- Anatomy  Cranium:               Appears normal         LVOT:                   Appears normal  Cavum:                 Appears normal         Aortic Arch:            Appears normal  Ventricles:            Appears normal         Ductal Arch:            Appears normal  Choroid Plexus:        Resolved Rt CP cyst    Diaphragm:              Appears normal  Cerebellum:            Appears normal         Stomach:                Appears normal, left  sided  Posterior Fossa:       Appears normal          Abdomen:                Appears normal  Nuchal Fold:           Previously seen        Abdominal Wall:         Previously seen  Face:                  Orbits and profile     Cord Vessels:           Previously seen                         previously seen  Lips:                  Previously seen        Kidneys:                Appear normal  Palate:                Previously seen        Bladder:                Appears normal  Thoracic:              Appears normal         Spine:                  Previously seen  Heart:                 Appears normal         Upper Extremities:      Previously seen                         (4CH, axis, and                         situs)  RVOT:                  Appears normal         Lower Extremities:      Previously seen  Other:  Heels/feet and open hands/5th digits previously  visualized. Nasal          bone previously visualized. ---------------------------------------------------------------------- Cervix Uterus Adnexa  Cervix  Length:            4.4  cm.  Normal appearance by transabdominal scan. ---------------------------------------------------------------------- Comments  This patient was seen for a follow up growth scan due to a  low-lying placenta noted during her prior ultrasound exams.  She denies any problems since her last exam and denies any  vaginal bleeding.  She was informed that the fetal growth and amniotic fluid  level appears appropriate for her gestational age.  A low-lying placenta measuring 1.3 cm away from the internal  cervical os continues to be noted today.  Precautions  associated with the placenta previa were discussed.  A follow-up exam was scheduled in 6 weeks to assess the  placental location.  The patient was advised that I anticipate  that the placenta previa will most likely have resolved by her  next exam. ----------------------------------------------------------------------                   Johnell Comings,  MD Electronically Signed Final Report    01/25/2019 04:34 pm ----------------------------------------------------------------------   Assessment and Plan:  Pregnancy: G3P1011 at [redacted]w[redacted]d 1. Low-lying placenta in third trimester - she has a follow up MFM 1/21  2. Supervision of other normal pregnancy, antepartum   Preterm labor symptoms and general obstetric precautions including but not limited to vaginal bleeding, contractions, leaking of fluid and fetal movement were reviewed in detail with the patient. I discussed the assessment and treatment plan with the patient. The patient was provided an opportunity to ask questions and all were answered. The patient agreed with the plan and demonstrated an understanding of the instructions. The patient was advised to call back or seek an in-person office evaluation/go to MAU at [facility] for any urgent or concerning symptoms. Please refer to After Visit Summary for other counseling recommendations.   I provided 10 minutes of face-to-face time during this encounter.  No follow-ups on file.  Future Appointments  Date Time Provider Department Center  02/12/2019 11:15 AM Allie Bossier, MD [facility]  02/27/2019 11:00 AM Federico Flake, MD [facility]  03/15/2019  3:45 PM [facility]-MFC [place] 2 WH-MFCUS [facility]-US  03/15/2019  3:50 PM WH-MFC NURSE WH-MFC MFC-US    Allie Bossier, MD Center for [employer], [facility]

## 2019-02-15 NOTE — L&D Delivery Note (Signed)
Delivery Note At 7:25 AM a viable female was delivered via Vaginal, Spontaneous (Presentation: Left Occiput Anterior).  APGAR: 4, 9; weight: PENDING.   Placenta status: Spontaneous, Intact.  Cord: 3 vessels with the following complications: None.   Anesthesia: None Episiotomy: None Lacerations: 1st degree perineal Suture Repair: 3.0 vicryl Est. Blood Loss (mL):  The patient was noted to be complete and pushing. Patient noted to have NO epidural anesthesia.  The patient was asked to push and the head delivered spontaneously in the LOA position. A nuchal cord was checked and relieved.   The anterior shoulder delivered easily and the posterior shoulder followed. The remainder of the infant was easily delivered and placed on mom's chest skin-to-skin. After a 30-second delay the cord was clamped x 2 and cut by FOB. The oropharynx and nasopharynx were bulb suctioned. then passed to the warmer where nursing staff personnel were in attendance. The infant was noted to have spontaneous cry and movement of all 4 extremities.    Cord blood and cord pH were then obtained.   The placenta delivered intact spontaneously. Pitocin was started IV to firm the uterus.   Examination of the cervix and vaginal vault did not reveal any lacerations. Examination of the perineum showed a 1st-degree laceration. Local anesthetic was applied.   The laceration was repaired with 3-0 Vicryl in a standard fashion. The patient tolerated the procedure well.   All sponge and needle counts were correct. Dr. Jerilynn Birkenhead was present for the entire procedure.   Mom to postpartum.  Baby to Couplet care / Skin to Skin.  Angela [name] 04/13/2019, 7:55 AM  OB FELLOW DELIVERY ATTESTATION  I was gloved and present for the delivery in its entirety, and I agree with the above resident's note. Arterial cord gas collected and pending; Apgars  4,9.  Jerilynn Birkenhead, MD [facility] Family Medicine Fellow, [facility] for  [employer], [facility]

## 2019-02-17 ENCOUNTER — Other Ambulatory Visit: Payer: Self-pay | Admitting: Family Medicine

## 2019-02-18 NOTE — Telephone Encounter (Signed)
Requested medication (s) are due for refill today: yes  Requested medication (s) are on the active medication list: yes  Last refill:  11/09/2018  Future visit scheduled: no  Notes to clinic:  no valid encounter within last 6 months   Requested Prescriptions  Pending Prescriptions Disp Refills   sertraline (ZOLOFT) 50 MG tablet [Pharmacy Med Name: SERTRALINE 50MG  TABLETS] 90 tablet 0    Sig: TAKE 1 TABLET(50 MG) BY MOUTH DAILY      Psychiatry:  Antidepressants - SSRI Failed - 02/17/2019  2:35 PM      Failed - Valid encounter within last 6 months    Recent Outpatient Visits           7 months ago Acute recurrent frontal sinusitis   [facility] [place], [name], DO   1 year ago Acute recurrent frontal sinusitis   [facility] [place], [place] T, NP   1 year ago Non-recurrent acute suppurative otitis media of right ear without spontaneous rupture of tympanic membrane   [facility] [place], [name], DO   1 year ago Acute maxillary sinusitis, recurrence not specified   [facility] [place], Priddy, DO   1 year ago Dizziness   [facility] [place], [place], DO       Future Appointments             In 1 week [name], Alvester Morin, MD Center for [facility], [facility]            Passed - Completed PHQ-2 or PHQ-9 in the last 360 days.

## 2019-02-18 NOTE — Telephone Encounter (Signed)
Needs appointment

## 2019-02-19 NOTE — Telephone Encounter (Signed)
Pt has scheduled an appointment for 02/21/19 but will be out of medication and would like to know if she could have some sent in to the pharmacy.

## 2019-02-21 ENCOUNTER — Other Ambulatory Visit: Payer: Self-pay

## 2019-02-21 ENCOUNTER — Ambulatory Visit (INDEPENDENT_AMBULATORY_CARE_PROVIDER_SITE_OTHER): Payer: Self-pay | Admitting: Family Medicine

## 2019-02-21 ENCOUNTER — Encounter: Payer: Self-pay | Admitting: Family Medicine

## 2019-02-21 DIAGNOSIS — R0789 Other chest pain: Secondary | ICD-10-CM

## 2019-02-21 DIAGNOSIS — F418 Other specified anxiety disorders: Secondary | ICD-10-CM

## 2019-02-21 MED ORDER — SERTRALINE HCL 50 MG PO TABS: ORAL_TABLET | ORAL | 3 refills | Status: DC

## 2019-02-21 MED ORDER — SIMETHICONE 80 MG PO CHEW: 80.0000 mg | CHEWABLE_TABLET | Freq: Four times a day (QID) | ORAL | 6 refills | Status: DC | PRN

## 2019-02-21 NOTE — Patient Instructions (Signed)
Food Choices for Gastroesophageal Reflux Disease, Adult When you have gastroesophageal reflux disease (GERD), the foods you eat and your eating habits are very important. Choosing the right foods can help ease the discomfort of GERD. Consider working with a diet and nutrition specialist (dietitian) to help you make healthy food choices. What general guidelines should I follow?  Eating plan  Choose healthy foods low in fat, such as fruits, vegetables, whole grains, low-fat dairy products, and lean meat, fish, and poultry.  Eat frequent, small meals instead of three large meals each day. Eat your meals slowly, in a relaxed setting. Avoid bending over or lying down until 2-3 hours after eating.  Limit high-fat foods such as fatty meats or fried foods.  Limit your intake of oils, butter, and shortening to less than 8 teaspoons each day.  Avoid the following: ? Foods that cause symptoms. These may be different for different people. Keep a food diary to keep track of foods that cause symptoms. ? Alcohol. ? Drinking large amounts of liquid with meals. ? Eating meals during the 2-3 hours before bed.  Cook foods using methods other than frying. This may include baking, grilling, or broiling. Lifestyle  Maintain a healthy weight. Ask your health care provider what weight is healthy for you. If you need to lose weight, work with your health care provider to do so safely.  Exercise for at least 30 minutes on 5 or more days each week, or as told by your health care provider.  Avoid wearing clothes that fit tightly around your waist and chest.  Do not use any products that contain nicotine or tobacco, such as cigarettes and e-cigarettes. If you need help quitting, ask your health care provider.  Sleep with the head of your bed raised. Use a wedge under the mattress or blocks under the bed frame to raise the head of the bed. What foods are not recommended? The items listed may not be a complete  list. Talk with your dietitian about what dietary choices are best for you. Grains Pastries or quick breads with added fat. French toast. Vegetables Deep fried vegetables. French fries. Any vegetables prepared with added fat. Any vegetables that cause symptoms. For some people this may include tomatoes and tomato products, chili peppers, onions and garlic, and horseradish. Fruits Any fruits prepared with added fat. Any fruits that cause symptoms. For some people this may include citrus fruits, such as oranges, grapefruit, pineapple, and lemons. Meats and other protein foods High-fat meats, such as fatty beef or pork, hot dogs, ribs, ham, sausage, salami and bacon. Fried meat or protein, including fried fish and fried chicken. Nuts and nut butters. Dairy Whole milk and chocolate milk. Sour cream. Cream. Ice cream. Cream cheese. Milk shakes. Beverages Coffee and tea, with or without caffeine. Carbonated beverages. Sodas. Energy drinks. Fruit juice made with acidic fruits (such as orange or grapefruit). Tomato juice. Alcoholic drinks. Fats and oils Butter. Margarine. Shortening. Ghee. Sweets and desserts Chocolate and cocoa. Donuts. Seasoning and other foods Pepper. Peppermint and spearmint. Any condiments, herbs, or seasonings that cause symptoms. For some people, this may include curry, hot sauce, or vinegar-based salad dressings. Summary  When you have gastroesophageal reflux disease (GERD), food and lifestyle choices are very important to help ease the discomfort of GERD.  Eat frequent, small meals instead of three large meals each day. Eat your meals slowly, in a relaxed setting. Avoid bending over or lying down until 2-3 hours after eating.  Limit high-fat   foods such as fatty meat or fried foods. This information is not intended to replace advice given to you by your health care provider. Make sure you discuss any questions you have with your health care provider. Document Revised:  05/24/2018 Document Reviewed: 02/02/2016 Elsevier Patient Education  2020 Elsevier Inc.  

## 2019-02-21 NOTE — Progress Notes (Signed)
LMP 07/20/2018 (Exact Date)    Subjective:    Patient ID: Angela [name], female    DOB: March 04, 1985, 34 y.o.   MRN: [id number]  HPI: Angela [name] is a 34 y.o. female  Chief Complaint  Patient presents with  . Depression  . Anxiety   ANXIETY/DEPRESSION Duration:controlled Anxious mood: no  Excessive worrying: no Irritability: no  Sweating: no Nausea: no Palpitations:no Hyperventilation: no Panic attacks: no Agoraphobia: no  Obscessions/compulsions: no Depressed mood: no Depression screen [facility] 2/9 02/21/2019 06/28/2018 04/11/2017 05/20/2016 11/18/2015  Decreased Interest 0 0 0 0 1  Down, Depressed, Hopeless 0 0 1 0 0  PHQ - 2 Score 0 0 1 0 1  Altered sleeping 0 0 - - 0  Tired, decreased energy 0 3 - - 1  Change in appetite 0 0 - - 0  Feeling bad or failure about yourself  0 0 - - 0  Trouble concentrating 0 0 - - 1  Moving slowly or fidgety/restless 0 0 - - 0  Suicidal thoughts 0 0 - - 0  PHQ-9 Score 0 3 - - 3  Difficult doing work/chores Not difficult at all Not difficult at all - - -   GAD 7 : Generalized Anxiety Score 02/21/2019 04/11/2017 05/20/2016 11/18/2015  Nervous, Anxious, on Edge 0 0 1 0  Control/stop worrying 0 0 1 0  Worry too much - different things 0 0 1 1  Trouble relaxing 3 0 1 0  Restless 0 0 3 1  Easily annoyed or irritable 3 3 0 1  Afraid - awful might happen 0 3 0 0  Total GAD 7 Score 6 6 7 3   Anxiety Difficulty Not difficult at all Not difficult at all Not difficult at all Not difficult at all   Anhedonia: no Weight changes: no Insomnia: no   Hypersomnia: no Fatigue/loss of energy: no Feelings of worthlessness: no Feelings of guilt: no Impaired concentration/indecisiveness: no Suicidal ideations: no  Crying spells: no Recent Stressors/Life Changes: no   Relationship problems: no   Family stress: no     Financial stress: no    Job stress: no    Recent death/loss: no  Has been having a lot of gas when she lays down at night as well as  diarrhea/constipation. Is currently in her 3rd trimester of pregnancy- notes that it was not like this with her last one. She is otherwise doing well with no other concerns or complaints at this time.   Relevant past medical, surgical, family and social history reviewed and updated as indicated. Interim medical history since our last visit reviewed. Allergies and medications reviewed and updated.  Review of Systems  Constitutional: Negative.   Respiratory: Negative.   Cardiovascular: Negative.   Gastrointestinal: Positive for constipation and diarrhea. Negative for abdominal distention, abdominal pain, anal bleeding, blood in stool, nausea, rectal pain and vomiting.  Neurological: Negative.   Psychiatric/Behavioral: Negative.     Per HPI unless specifically indicated above     Objective:    LMP 07/20/2018 (Exact Date)   Wt Readings from Last 3 Encounters:  02/12/19 147 lb (66.7 kg)  01/29/19 146 lb 9.6 oz (66.5 kg)  12/24/18 140 lb (63.5 kg)    Physical Exam Vitals and nursing note reviewed.  Constitutional:      General: She is not in acute distress.    Appearance: Normal appearance. She is not ill-appearing, toxic-appearing or diaphoretic.  HENT:     Head: Normocephalic and atraumatic.  Right Ear: External ear normal.     Left Ear: External ear normal.     Nose: Nose normal.     Mouth/Throat:     Mouth: Mucous membranes are moist.     Pharynx: Oropharynx is clear.  Eyes:     General: No scleral icterus.       Right eye: No discharge.        Left eye: No discharge.     Conjunctiva/sclera: Conjunctivae normal.     Pupils: Pupils are equal, round, and reactive to light.  Pulmonary:     Effort: Pulmonary effort is normal. No respiratory distress.     Comments: Speaking in full sentences Musculoskeletal:        General: Normal range of motion.     Cervical back: Normal range of motion.  Skin:    Coloration: Skin is not jaundiced or pale.     Findings: No bruising,  erythema, lesion or rash.  Neurological:     Mental Status: She is alert and oriented to person, place, and time. Mental status is at baseline.  Psychiatric:        Mood and Affect: Mood normal.        Behavior: Behavior normal.        Thought Content: Thought content normal.        Judgment: Judgment normal.     Results for orders placed or performed in visit on 01/29/19  HIV antibody (with reflex)  Result Value Ref Range   HIV Screen 4th Generation wRfx Non Reactive Non Reactive  RPR  Result Value Ref Range   RPR Ser Ql Non Reactive Non Reactive  CBC  Result Value Ref Range   WBC 6.2 3.4 - 10.8 x10E3/uL   RBC 3.97 3.77 - 5.28 x10E6/uL   Hemoglobin 10.7 (L) 11.1 - 15.9 g/dL   Hematocrit 33.5 (L) 34.0 - 46.6 %   MCV 84 79 - 97 fL   MCH 27.0 26.6 - 33.0 pg   MCHC 31.9 31.5 - 35.7 g/dL   RDW 13.5 11.7 - 15.4 %   Platelets 181 150 - 450 x10E3/uL  2 Hour GTT  Result Value Ref Range   Glucose, Fasting 73 65 - 91 mg/dL   Glucose, 1 hour 144 65 - 179 mg/dL   Glucose, 2 hour 112 65 - 152 mg/dL      Assessment & Plan:   Problem List Items Addressed This Visit      Other   Depression with anxiety - Primary    Under good control on current regimen. Continue current regimen. Continue to monitor. Call with any concerns. Refills given.        Relevant Medications   sertraline (ZOLOFT) 50 MG tablet    Other Visit Diagnoses    Gassy chest pain       Will raise head of her bed and start simethicone. Call if not getting better or getting worse.        Follow up plan: Return in about 6 months (around 08/21/2019) for Physical.   . This visit was completed via D[employer] due to the restrictions of the COVID-19 pandemic. All issues as above were discussed and addressed. Physical exam was done as above through visual confirmation on Doximity. If it was felt that the patient should be evaluated in the office, they were directed there. The patient verbally consented to this  visit. . Location of the patient: home . Location of the provider: work . Those involved with  this call:  . Provider: Olevia Perches, DO . CMA: Tiffany Reel, CMA . Front Desk/Registration: Adela Ports  . Time spent on call: 25 minutes with patient face to face via video conference. More than 50% of this time was spent in counseling and coordination of care. 40 minutes total spent in review of patient's record and preparation of their chart.

## 2019-02-21 NOTE — Assessment & Plan Note (Signed)
Under good control on current regimen. Continue current regimen. Continue to monitor. Call with any concerns. Refills given.   

## 2019-02-27 ENCOUNTER — Encounter: Payer: Self-pay | Admitting: Family Medicine

## 2019-02-27 ENCOUNTER — Telehealth (INDEPENDENT_AMBULATORY_CARE_PROVIDER_SITE_OTHER): Payer: Self-pay | Admitting: Family Medicine

## 2019-02-27 ENCOUNTER — Other Ambulatory Visit: Payer: Self-pay

## 2019-02-27 VITALS — BP 117/67 | HR 72

## 2019-02-27 DIAGNOSIS — Z3A31 31 weeks gestation of pregnancy: Secondary | ICD-10-CM

## 2019-02-27 DIAGNOSIS — Z3480 Encounter for supervision of other normal pregnancy, unspecified trimester: Secondary | ICD-10-CM

## 2019-02-27 DIAGNOSIS — O4443 Low lying placenta NOS or without hemorrhage, third trimester: Secondary | ICD-10-CM

## 2019-02-27 NOTE — Progress Notes (Signed)
I connected with  Angela [name] on 02/27/19 at 11:00 AM EST by telephone and verified that I am speaking with the correct person using two identifiers.   I discussed the limitations, risks, security and privacy concerns of performing an evaluation and management service by telephone and the availability of in person appointments. I also discussed with the patient that there may be a patient responsible charge related to this service. The patient expressed understanding and agreed to proceed.  Delanna Blacketer Emeline Darling, CMA 02/27/2019  11:21 AM

## 2019-02-27 NOTE — Progress Notes (Signed)
I connected with@ on 02/27/19 at 11:00 AM EST by: MyChart and verified that I am speaking with the correct person using two identifiers.  Patient is located at [facility] and provider is located at [facility].     The purpose of this virtual visit is to provide medical care while limiting exposure to the novel coronavirus. I discussed the limitations, risks, security and privacy concerns of performing an evaluation and management service by Mychart and the availability of in person appointments. I also discussed with the patient that there may be a patient responsible charge related to this service. By engaging in this virtual visit, you consent to the provision of healthcare.  Additionally, you authorize for your insurance to be billed for the services provided during this visit.  The patient expressed understanding and agreed to proceed.  The following staff members participated in the virtual visit:  Demetrice Cheree Ditto    PRENATAL VISIT NOTE  Subjective:  Angela [name] is a 34 y.o. G3P1011 at [redacted]w[redacted]d  for phone visit for ongoing prenatal care.  She is currently monitored for the following issues for this low-risk pregnancy and has IBS (irritable bowel syndrome); Raynaud disease; Depression with anxiety; Bilateral carpal tunnel syndrome; Epistaxis; Gastroesophageal reflux disease without esophagitis; Stress incontinence of urine; Supervision of other normal pregnancy, antepartum; and Low-lying placenta in third trimester on their problem list.  Patient reports no complaints.  Contractions: Not present. Vag. Bleeding: None.  Movement: Present. Denies leaking of fluid.   Reports GERD in the middle of the night - improved since starting GasX and sleeping on extra pillow  The following portions of the patient's history were reviewed and updated as appropriate: allergies, current medications, past family history, past medical history, past social history, past surgical history and problem list.   Objective:    Vitals:   02/27/19 1118  BP: 117/67  Pulse: 72   Self-Obtained  Fetal Status:     Movement: Present     Assessment and Plan:  Pregnancy: G3P1011 at [redacted]w[redacted]d 1. Supervision of other normal pregnancy, antepartum Updated box Reviewed contraception in detail. Desires POPs Reviewed GERD which is improving. Will continue to monitor sx.   2. Low-lying placenta in third trimester Has repeat US on 1/29  Preterm labor symptoms and general obstetric precautions including but not limited to vaginal bleeding, contractions, leaking of fluid and fetal movement were reviewed in detail with the patient.  Return in about 2 weeks (around 03/13/2019) for Routine prenatal care, Telehealth/Virtual health OB Visit.  Future Appointments  Date Time Provider Department Center  03/13/2019  1:00 PM Federico Flake, MD [facility]  03/15/2019  3:45 PM [facility] [place] 2 [facility]  03/15/2019  3:50 PM [facility] NURSE [facility]  08/22/2019  3:00 PM Dorcas Carrow, DO [facility]     Time spent on virtual visit: 10 minutes  Federico Flake, MD

## 2019-03-13 ENCOUNTER — Telehealth (INDEPENDENT_AMBULATORY_CARE_PROVIDER_SITE_OTHER): Payer: BC Managed Care – PPO | Admitting: Family Medicine

## 2019-03-13 ENCOUNTER — Encounter: Payer: Self-pay | Admitting: Family Medicine

## 2019-03-13 VITALS — BP 122/83 | Wt 156.0 lb

## 2019-03-13 DIAGNOSIS — O4443 Low lying placenta NOS or without hemorrhage, third trimester: Secondary | ICD-10-CM

## 2019-03-13 DIAGNOSIS — Z3480 Encounter for supervision of other normal pregnancy, unspecified trimester: Secondary | ICD-10-CM

## 2019-03-13 DIAGNOSIS — Z3A33 33 weeks gestation of pregnancy: Secondary | ICD-10-CM

## 2019-03-13 NOTE — Progress Notes (Signed)
I connected with@ on 03/13/19 at  1:00 PM EST by: MyChart  and verified that I am speaking with the correct person using two identifiers.  Patient is located at cart and provider is located at [facility].     The purpose of this virtual visit is to provide medical care while limiting exposure to the novel coronavirus. I discussed the limitations, risks, security and privacy concerns of performing an evaluation and management service by MyChart  and the availability of in person appointments. I also discussed with the patient that there may be a patient responsible charge related to this service. By engaging in this virtual visit, you consent to the provision of healthcare.  Additionally, you authorize for your insurance to be billed for the services provided during this visit.  The patient expressed understanding and agreed to proceed.  The following staff members participated in the virtual visit:  Scheryl Marten    PRENATAL VISIT NOTE  Subjective:  Angela [name] is a 33 y.o. G3P1011 at [redacted]w[redacted]d  for phone visit for ongoing prenatal care.  She is currently monitored for the following issues for this low-risk pregnancy and has IBS (irritable bowel syndrome); Raynaud disease; Depression with anxiety; Bilateral carpal tunnel syndrome; Epistaxis; Gastroesophageal reflux disease without esophagitis; Stress incontinence of urine; Supervision of other normal pregnancy, antepartum; and Low-lying placenta in third trimester on their problem list.  Patient reports mild vaginal stinging.  Contractions: Irritability. Vag. Bleeding: None.  Movement: Present. Denies leaking of fluid.   The following portions of the patient's history were reviewed and updated as appropriate: allergies, current medications, past family history, past medical history, past social history, past surgical history and problem list.   Objective:   Vitals:   03/13/19 1306  BP: 122/83  Weight: 156 lb (70.8 kg)   Self-Obtained  Fetal  Status:     Movement: Present     Assessment and Plan:  Pregnancy: G3P1011 at [redacted]w[redacted]d  1. Supervision of other normal pregnancy, antepartum Up to date.  Discussed weight gain in pregnancy- per patient has gained 6 lbs since last visit. Reviewed that with normal BP this is less concerning Discussed upcoming [place]  Reviewed 36 wk labs Vaginal stinging seems to be normal round ligament pain  2. Low-lying placenta in third trimester Has [place] on 1/29   Preterm labor symptoms and general obstetric precautions including but not limited to vaginal bleeding, contractions, leaking of fluid and fetal movement were reviewed in detail with the patient.  Return in about 2 weeks (around 03/27/2019) for Routine prenatal care, 36wks, in person.  Future Appointments  Date Time Provider Department Center  03/15/2019  3:45 PM [facility] [place] 2 [facility]  03/15/2019  3:50 PM [facility] NURSE [facility]  03/27/2019  3:15 PM Federico Flake, MD [facility]  08/22/2019  3:00 PM Dorcas Carrow, DO [facility]     Time spent on virtual visit: 10 minutes  Federico Flake, MD

## 2019-03-13 NOTE — Progress Notes (Signed)
I connected with  Angela [name] on 03/13/19 at  1:00 PM EST by telephone and verified that I am speaking with the correct person using two identifiers.   I discussed the limitations, risks, security and privacy concerns of performing an evaluation and management service by telephone and the availability of in person appointments. I also discussed with the patient that there may be a patient responsible charge related to this service. The patient expressed understanding and agreed to proceed.  Scheryl Marten, RN 03/13/2019  1:07 PM

## 2019-03-15 ENCOUNTER — Ambulatory Visit (HOSPITAL_COMMUNITY)
Admission: RE | Admit: 2019-03-15 | Discharge: 2019-03-15 | Disposition: A | Discharge status: Home / Self Care | Payer: BC Managed Care – PPO | Source: Ambulatory Visit | Attending: Obstetrics and Gynecology | Admitting: Obstetrics and Gynecology

## 2019-03-15 ENCOUNTER — Encounter (HOSPITAL_COMMUNITY): Payer: Self-pay

## 2019-03-15 ENCOUNTER — Other Ambulatory Visit: Payer: Self-pay

## 2019-03-15 ENCOUNTER — Ambulatory Visit (HOSPITAL_COMMUNITY): Payer: BC Managed Care – PPO | Admitting: *Deleted

## 2019-03-15 DIAGNOSIS — O359XX0 Maternal care for (suspected) fetal abnormality and damage, unspecified, not applicable or unspecified: Secondary | ICD-10-CM

## 2019-03-15 DIAGNOSIS — Z3480 Encounter for supervision of other normal pregnancy, unspecified trimester: Secondary | ICD-10-CM

## 2019-03-15 DIAGNOSIS — O4443 Low lying placenta NOS or without hemorrhage, third trimester: Secondary | ICD-10-CM | POA: Insufficient documentation

## 2019-03-15 DIAGNOSIS — O09293 Supervision of pregnancy with other poor reproductive or obstetric history, third trimester: Secondary | ICD-10-CM

## 2019-03-15 DIAGNOSIS — Z3A34 34 weeks gestation of pregnancy: Secondary | ICD-10-CM

## 2019-03-15 DIAGNOSIS — Z362 Encounter for other antenatal screening follow-up: Secondary | ICD-10-CM

## 2019-03-27 ENCOUNTER — Ambulatory Visit (INDEPENDENT_AMBULATORY_CARE_PROVIDER_SITE_OTHER): Payer: BC Managed Care – PPO | Admitting: Family Medicine

## 2019-03-27 ENCOUNTER — Other Ambulatory Visit: Payer: Self-pay

## 2019-03-27 VITALS — BP 107/70 | HR 92 | Wt 157.0 lb

## 2019-03-27 DIAGNOSIS — Z3A35 35 weeks gestation of pregnancy: Secondary | ICD-10-CM

## 2019-03-27 DIAGNOSIS — O4443 Low lying placenta NOS or without hemorrhage, third trimester: Secondary | ICD-10-CM

## 2019-03-27 DIAGNOSIS — Z3480 Encounter for supervision of other normal pregnancy, unspecified trimester: Secondary | ICD-10-CM

## 2019-03-27 NOTE — Progress Notes (Signed)
   PRENATAL VISIT NOTE  Subjective:  Angela [name] is a 34 y.o. G3P1011 at [redacted]w[redacted]d being seen today for ongoing prenatal care.  She is currently monitored for the following issues for this low-risk pregnancy and has IBS (irritable bowel syndrome); Raynaud disease; Depression with anxiety; Bilateral carpal tunnel syndrome; Epistaxis; Gastroesophageal reflux disease without esophagitis; Stress incontinence of urine; Supervision of other normal pregnancy, antepartum; and Low-lying placenta in third trimester-RESOLVED @34wk  on their problem list.  Patient reports no complaints.  Contractions: Irritability. Vag. Bleeding: None.  Movement: Present. Denies leaking of fluid.   The following portions of the patient's history were reviewed and updated as appropriate: allergies, current medications, past family history, past medical history, past social history, past surgical history and problem list.   Objective:   Vitals:   03/27/19 1531  BP: 107/70  Pulse: 92  Weight: 157 lb (71.2 kg)    Fetal Status: Fetal Heart Rate (bpm): 126   Movement: Present     General:  Alert, oriented and cooperative. Patient is in no acute distress.  Skin: Skin is warm and dry. No rash noted.   Cardiovascular: Normal heart rate noted  Respiratory: Normal respiratory effort, no problems with respiration noted  Abdomen: Soft, gravid, appropriate for gestational age.  Pain/Pressure: Present     Pelvic: Cervical exam deferred        Extremities: Normal range of motion.  Edema: Trace  Mental Status: Normal mood and affect. Normal behavior. Normal judgment and thought content.   Assessment and Plan:  Pregnancy: G3P1011 at [redacted]w[redacted]d  1. Supervision of other normal pregnancy, antepartum Up to date Denies swelling and itching today 36 wk labs at next visit  2. Low-lying placenta in third trimester Resolved at 34 wks  Preterm labor symptoms and general obstetric precautions including but not limited to vaginal  bleeding, contractions, leaking of fluid and fetal movement were reviewed in detail with the patient. Please refer to After Visit Summary for other counseling recommendations.   Return in about 1 week (around 04/03/2019) for Routine prenatal care, 36wks, in person.  Future Appointments  Date Time Provider Department Center  08/22/2019  3:00 PM 10/23/2019, DO [facility]    Dorcas Carrow, MD

## 2019-04-03 ENCOUNTER — Other Ambulatory Visit: Payer: Self-pay

## 2019-04-03 ENCOUNTER — Ambulatory Visit (INDEPENDENT_AMBULATORY_CARE_PROVIDER_SITE_OTHER): Payer: BC Managed Care – PPO | Admitting: Advanced Practice Midwife

## 2019-04-03 VITALS — BP 113/75 | HR 75 | Wt 159.0 lb

## 2019-04-03 DIAGNOSIS — Z3A36 36 weeks gestation of pregnancy: Secondary | ICD-10-CM

## 2019-04-03 DIAGNOSIS — Z113 Encounter for screening for infections with a predominantly sexual mode of transmission: Secondary | ICD-10-CM | POA: Diagnosis not present

## 2019-04-03 DIAGNOSIS — Z3480 Encounter for supervision of other normal pregnancy, unspecified trimester: Secondary | ICD-10-CM

## 2019-04-03 DIAGNOSIS — Z3483 Encounter for supervision of other normal pregnancy, third trimester: Secondary | ICD-10-CM

## 2019-04-03 NOTE — Patient Instructions (Signed)

## 2019-04-03 NOTE — Progress Notes (Signed)
   PRENATAL VISIT NOTE  Subjective:  Angela [name] is a 34 y.o. G3P1011 at [redacted]w[redacted]d being seen today for ongoing prenatal care.  She is currently monitored for the following issues for this low-risk pregnancy and has IBS (irritable bowel syndrome); Raynaud disease; Depression with anxiety; Bilateral carpal tunnel syndrome; Epistaxis; Gastroesophageal reflux disease without esophagitis; Stress incontinence of urine; Supervision of other normal pregnancy, antepartum; and Low-lying placenta in third trimester-RESOLVED @34wk  on their problem list.  Patient reports no complaints. She denies itching or swelling.  Contractions: Irritability. Vag. Bleeding: None.  Movement: Present. Denies leaking of fluid.   The following portions of the patient's history were reviewed and updated as appropriate: allergies, current medications, past family history, past medical history, past social history, past surgical history and problem list. Problem list updated.  Objective:   Vitals:   04/03/19 1622  BP: 113/75  Pulse: 75  Weight: 159 lb (72.1 kg)    Fetal Status: Fetal Heart Rate (bpm): 141 Fundal Height: 36 cm Movement: Present  Presentation: Vertex  General:  Alert, oriented and cooperative. Patient is in no acute distress.  Skin: Skin is warm and dry. No rash noted.   Cardiovascular: Normal heart rate noted  Respiratory: Normal respiratory effort, no problems with respiration noted  Abdomen: Soft, gravid, appropriate for gestational age.  Pain/Pressure: Present     Pelvic: Cervical exam performed Dilation: 1 Effacement (%): Thick Station: -3  Extremities: Normal range of motion.     Mental Status: Normal mood and affect. Normal behavior. Normal judgment and thought content.   Assessment and Plan:  Pregnancy: G3P1011 at [redacted]w[redacted]d  1. Supervision of other normal pregnancy, antepartum - Routine care - EFW 2505g/66% at [facility] 03/15/2019. If induction is required, patient would like to schedule during 39th  week. Continue discussion next visit - Strep Gp B NAA - GC/Chlamydia probe amp ([place])not at [facility]  Preterm labor symptoms and general obstetric precautions including but not limited to vaginal bleeding, contractions, leaking of fluid and fetal movement were reviewed in detail with the patient. Please refer to After Visit Summary for other counseling recommendations.    Future Appointments  Date Time Provider Department Center  04/16/2019 11:15 AM 06/16/2019, MD [facility]  08/22/2019  3:00 PM 10/23/2019, DO [facility]    Dorcas Carrow, [place]or

## 2019-04-04 ENCOUNTER — Other Ambulatory Visit: Payer: Self-pay

## 2019-04-04 ENCOUNTER — Encounter (HOSPITAL_COMMUNITY): Payer: Self-pay | Admitting: Obstetrics & Gynecology

## 2019-04-04 ENCOUNTER — Inpatient Hospital Stay (HOSPITAL_COMMUNITY)
Admission: AD | Admit: 2019-04-04 | Discharge: 2019-04-04 | Disposition: A | Discharge status: Home / Self Care | Payer: BC Managed Care – PPO | Attending: Obstetrics & Gynecology | Admitting: Obstetrics & Gynecology

## 2019-04-04 DIAGNOSIS — O479 False labor, unspecified: Secondary | ICD-10-CM | POA: Diagnosis not present

## 2019-04-04 DIAGNOSIS — Z3A36 36 weeks gestation of pregnancy: Secondary | ICD-10-CM | POA: Diagnosis not present

## 2019-04-04 DIAGNOSIS — O4703 False labor before 37 completed weeks of gestation, third trimester: Secondary | ICD-10-CM | POA: Diagnosis not present

## 2019-04-04 DIAGNOSIS — Z3A00 Weeks of gestation of pregnancy not specified: Secondary | ICD-10-CM | POA: Insufficient documentation

## 2019-04-04 DIAGNOSIS — Z3480 Encounter for supervision of other normal pregnancy, unspecified trimester: Secondary | ICD-10-CM

## 2019-04-04 NOTE — Discharge Instructions (Signed)

## 2019-04-04 NOTE — MAU Note (Signed)
Having contractions yesterday but not painful and was checked at office -was 1 cm.  Today, contractions got more painful, took a nap and ctxs eased up then they returned.  Ctxs are not as bad now since trip over here from home. No bleeding.  No leaking. Baby moving well.

## 2019-04-04 NOTE — MAU Note (Signed)
I have communicated with Marylee Floras CNM and reviewed vital signs:  Vitals:   04/04/19 2143 04/04/19 2144  BP:  114/67  Pulse:  77  Resp: 16   Temp: 98.8 F (37.1 C)   SpO2:      Vaginal exam:  Dilation: 1.5 Effacement (%): 50 Cervical Position: Posterior Station: -2 Presentation: Vertex Exam by:: Namari Breton Forensic scientist,   Also reviewed contraction pattern and that non-stress test is reactive.  It has been documented that patient is contracting every 7-9 minutes.  Pt was 1 cm at office visit yesterday and is 1.5 cm today.  Per Drenda Freeze CNM, no repeat cervical exam ordered.  Patient denies any other complaints.  Based on this report provider has given order for discharge.  A discharge order and diagnosis entered by a provider.   Labor discharge instructions reviewed with patient.

## 2019-04-04 NOTE — MAU Provider Note (Signed)
Pt came in for a labor check after contracting since yesterday, gotten more painful today, but ctx started getting less intense on the way to MAU, now 2/10.    NST: FHR baseline 135 bpm, Variability: absent, Accelerations:present, Decelerations:  Absent= Cat 1/Reactive  Ctx q 8-10 minutes Cx 1.5/50/-2  Pt D/C'd home w/labor precautions.

## 2019-04-05 LAB — STREP GP B NAA: Strep Gp B NAA: POSITIVE — AB

## 2019-04-08 ENCOUNTER — Encounter: Payer: Self-pay | Admitting: Advanced Practice Midwife

## 2019-04-08 DIAGNOSIS — A491 Streptococcal infection, unspecified site: Secondary | ICD-10-CM | POA: Insufficient documentation

## 2019-04-08 HISTORY — DX: Streptococcal infection, unspecified site: A49.1

## 2019-04-08 LAB — GC/CHLAMYDIA PROBE AMP (~~LOC~~) NOT AT ARMC
Chlamydia: NEGATIVE
Comment: NEGATIVE
Comment: NORMAL
Neisseria Gonorrhea: NEGATIVE

## 2019-04-13 ENCOUNTER — Other Ambulatory Visit: Payer: Self-pay

## 2019-04-13 ENCOUNTER — Inpatient Hospital Stay (HOSPITAL_COMMUNITY)
Admission: AD | Admit: 2019-04-13 | Discharge: 2019-04-15 | DRG: 807 | Disposition: A | Discharge status: Home / Self Care | Payer: BC Managed Care – PPO | Attending: Obstetrics & Gynecology | Admitting: Obstetrics & Gynecology

## 2019-04-13 ENCOUNTER — Encounter (HOSPITAL_COMMUNITY): Payer: Self-pay | Admitting: Obstetrics and Gynecology

## 2019-04-13 DIAGNOSIS — O99824 Streptococcus B carrier state complicating childbirth: Secondary | ICD-10-CM | POA: Diagnosis present

## 2019-04-13 DIAGNOSIS — Z20822 Contact with and (suspected) exposure to covid-19: Secondary | ICD-10-CM | POA: Diagnosis present

## 2019-04-13 DIAGNOSIS — F418 Other specified anxiety disorders: Secondary | ICD-10-CM | POA: Diagnosis present

## 2019-04-13 DIAGNOSIS — Z3A38 38 weeks gestation of pregnancy: Secondary | ICD-10-CM | POA: Diagnosis not present

## 2019-04-13 DIAGNOSIS — Z412 Encounter for routine and ritual male circumcision: Secondary | ICD-10-CM | POA: Diagnosis not present

## 2019-04-13 DIAGNOSIS — O4443 Low lying placenta NOS or without hemorrhage, third trimester: Secondary | ICD-10-CM | POA: Diagnosis present

## 2019-04-13 DIAGNOSIS — Z87891 Personal history of nicotine dependence: Secondary | ICD-10-CM

## 2019-04-13 DIAGNOSIS — O26893 Other specified pregnancy related conditions, third trimester: Secondary | ICD-10-CM | POA: Diagnosis not present

## 2019-04-13 DIAGNOSIS — O99344 Other mental disorders complicating childbirth: Secondary | ICD-10-CM | POA: Diagnosis present

## 2019-04-13 DIAGNOSIS — A491 Streptococcal infection, unspecified site: Secondary | ICD-10-CM | POA: Diagnosis present

## 2019-04-13 DIAGNOSIS — Z051 Observation and evaluation of newborn for suspected infectious condition ruled out: Secondary | ICD-10-CM | POA: Diagnosis not present

## 2019-04-13 DIAGNOSIS — Z9189 Other specified personal risk factors, not elsewhere classified: Secondary | ICD-10-CM | POA: Diagnosis not present

## 2019-04-13 DIAGNOSIS — Z23 Encounter for immunization: Secondary | ICD-10-CM | POA: Diagnosis not present

## 2019-04-13 LAB — RESPIRATORY PANEL BY RT PCR (FLU A&B, COVID)
Influenza A by PCR: NEGATIVE
Influenza B by PCR: NEGATIVE
SARS Coronavirus 2 by RT PCR: NEGATIVE

## 2019-04-13 LAB — TYPE AND SCREEN
ABO/RH(D): O POS
Antibody Screen: NEGATIVE

## 2019-04-13 LAB — CBC
HCT: 38.4 % (ref 36.0–46.0)
Hemoglobin: 12 g/dL (ref 12.0–15.0)
MCH: 26.8 pg (ref 26.0–34.0)
MCHC: 31.3 g/dL (ref 30.0–36.0)
MCV: 85.7 fL (ref 80.0–100.0)
Platelets: 159 10*3/uL (ref 150–400)
RBC: 4.48 MIL/uL (ref 3.87–5.11)
RDW: 14.1 % (ref 11.5–15.5)
WBC: 5.1 10*3/uL (ref 4.0–10.5)
nRBC: 0 % (ref 0.0–0.2)

## 2019-04-13 LAB — RPR: RPR Ser Ql: NONREACTIVE

## 2019-04-13 LAB — POCT FERN TEST: POCT Fern Test: NEGATIVE

## 2019-04-13 LAB — ABO/RH: ABO/RH(D): O POS

## 2019-04-13 MED ORDER — ACETAMINOPHEN 325 MG PO TABS: 650.0000 mg | ORAL_TABLET | ORAL | Status: DC | PRN

## 2019-04-13 MED ORDER — EPHEDRINE 5 MG/ML INJ: 10.0000 mg | INTRAVENOUS | Status: DC | PRN

## 2019-04-13 MED ORDER — PHENYLEPHRINE 40 MCG/ML (10ML) SYRINGE FOR IV PUSH (FOR BLOOD PRESSURE SUPPORT): 80.0000 ug | PREFILLED_SYRINGE | INTRAVENOUS | Status: DC | PRN

## 2019-04-13 MED ORDER — DIPHENHYDRAMINE HCL 50 MG/ML IJ SOLN: 12.5000 mg | INTRAMUSCULAR | Status: DC | PRN

## 2019-04-13 MED ORDER — ONDANSETRON HCL 4 MG/2ML IJ SOLN: 4.0000 mg | Freq: Four times a day (QID) | INTRAMUSCULAR | Status: DC | PRN

## 2019-04-13 MED ORDER — SERTRALINE HCL 50 MG PO TABS
50.0000 mg | ORAL_TABLET | Freq: Every day | ORAL | Status: DC
  Filled 2019-04-13: qty 1

## 2019-04-13 MED ORDER — FENTANYL CITRATE (PF) 100 MCG/2ML IJ SOLN
100.0000 ug | INTRAMUSCULAR | Status: DC | PRN
  Administered 2019-04-13: 100 ug via INTRAVENOUS

## 2019-04-13 MED ORDER — LACTATED RINGERS IV SOLN: 500.0000 mL | INTRAVENOUS | Status: DC | PRN

## 2019-04-13 MED ORDER — LACTATED RINGERS IV SOLN: 500.0000 mL | Freq: Once | INTRAVENOUS | Status: DC

## 2019-04-13 MED ORDER — IBUPROFEN 600 MG PO TABS
600.0000 mg | ORAL_TABLET | Freq: Four times a day (QID) | ORAL | Status: DC
  Administered 2019-04-13 – 2019-04-15 (×8): 600 mg via ORAL
  Filled 2019-04-13 (×8): qty 1

## 2019-04-13 MED ORDER — OXYTOCIN BOLUS FROM INFUSION
500.0000 mL | Freq: Once | INTRAVENOUS | Status: AC
  Administered 2019-04-13: 08:00:00 500 mL via INTRAVENOUS

## 2019-04-13 MED ORDER — LACTATED RINGERS IV SOLN: INTRAVENOUS | Status: DC

## 2019-04-13 MED ORDER — OXYCODONE HCL 5 MG PO TABS: 5.0000 mg | ORAL_TABLET | ORAL | Status: DC | PRN

## 2019-04-13 MED ORDER — DIPHENHYDRAMINE HCL 25 MG PO CAPS: 25.0000 mg | ORAL_CAPSULE | Freq: Four times a day (QID) | ORAL | Status: DC | PRN

## 2019-04-13 MED ORDER — ZOLPIDEM TARTRATE 5 MG PO TABS: 5.0000 mg | ORAL_TABLET | Freq: Every evening | ORAL | Status: DC | PRN

## 2019-04-13 MED ORDER — COCONUT OIL OIL
1.0000 "application " | TOPICAL_OIL | Status: DC | PRN
  Administered 2019-04-13: 1 via TOPICAL

## 2019-04-13 MED ORDER — ONDANSETRON HCL 4 MG/2ML IJ SOLN: 4.0000 mg | INTRAMUSCULAR | Status: DC | PRN

## 2019-04-13 MED ORDER — SODIUM CHLORIDE 0.9 % IV SOLN
2.0000 g | Freq: Once | INTRAVENOUS | Status: AC
  Administered 2019-04-13: 2 g via INTRAVENOUS
  Filled 2019-04-13: qty 2000

## 2019-04-13 MED ORDER — DIBUCAINE (PERIANAL) 1 % EX OINT: 1.0000 "application " | TOPICAL_OINTMENT | CUTANEOUS | Status: DC | PRN

## 2019-04-13 MED ORDER — PRENATAL MULTIVITAMIN CH
1.0000 | ORAL_TABLET | Freq: Every day | ORAL | Status: DC
  Administered 2019-04-14: 12:00:00 1 via ORAL
  Filled 2019-04-13: qty 1

## 2019-04-13 MED ORDER — SOD CITRATE-CITRIC ACID 500-334 MG/5ML PO SOLN: 30.0000 mL | ORAL | Status: DC | PRN

## 2019-04-13 MED ORDER — FENTANYL CITRATE (PF) 100 MCG/2ML IJ SOLN
INTRAMUSCULAR | Status: AC
  Filled 2019-04-13: qty 2

## 2019-04-13 MED ORDER — BENZOCAINE-MENTHOL 20-0.5 % EX AERO
1.0000 "application " | INHALATION_SPRAY | CUTANEOUS | Status: DC | PRN
  Administered 2019-04-13: 1 via TOPICAL
  Filled 2019-04-13: qty 56

## 2019-04-13 MED ORDER — LIDOCAINE HCL (PF) 1 % IJ SOLN
30.0000 mL | INTRAMUSCULAR | Status: AC | PRN
  Administered 2019-04-13: 08:00:00 30 mL via SUBCUTANEOUS
  Filled 2019-04-13: qty 30

## 2019-04-13 MED ORDER — ONDANSETRON HCL 4 MG PO TABS: 4.0000 mg | ORAL_TABLET | ORAL | Status: DC | PRN

## 2019-04-13 MED ORDER — OXYTOCIN 40 UNITS IN NORMAL SALINE INFUSION - SIMPLE MED
2.5000 [IU]/h | INTRAVENOUS | Status: DC
  Administered 2019-04-13: 08:00:00 2.5 [IU]/h via INTRAVENOUS
  Filled 2019-04-13: qty 1000

## 2019-04-13 MED ORDER — WITCH HAZEL-GLYCERIN EX PADS: 1.0000 "application " | MEDICATED_PAD | CUTANEOUS | Status: DC | PRN

## 2019-04-13 MED ORDER — SIMETHICONE 80 MG PO CHEW: 80.0000 mg | CHEWABLE_TABLET | ORAL | Status: DC | PRN

## 2019-04-13 MED ORDER — SENNOSIDES-DOCUSATE SODIUM 8.6-50 MG PO TABS
2.0000 | ORAL_TABLET | ORAL | Status: DC
  Administered 2019-04-13 – 2019-04-15 (×2): 2 via ORAL
  Filled 2019-04-13 (×2): qty 2

## 2019-04-13 MED ORDER — TETANUS-DIPHTH-ACELL PERTUSSIS 5-2.5-18.5 LF-MCG/0.5 IM SUSP: 0.5000 mL | Freq: Once | INTRAMUSCULAR | Status: DC

## 2019-04-13 MED ORDER — FENTANYL-BUPIVACAINE-NACL 0.5-0.125-0.9 MG/250ML-% EP SOLN: 12.0000 mL/h | EPIDURAL | Status: DC | PRN

## 2019-04-13 NOTE — Discharge Summary (Signed)
Postpartum Discharge Summary      Patient Name: Angela [name] DOB: 02-27-1985 MRN: [id number]  Date of admission: 04/13/2019 Delivering Provider: Chauncey Mann   Date of discharge: 04/15/2019  Admitting diagnosis: Labor and delivery, indication for care [O75.9] Intrauterine pregnancy: [redacted]w[redacted]d    Secondary diagnosis:  Active Problems:   Depression with anxiety   Low-lying placenta in third trimester-RESOLVED '@34wk'    GBS (group B streptococcus) infection   Labor and delivery, indication for care  Additional problems: None     Discharge diagnosis: Term Pregnancy Delivered                                                                                                Post partum procedures:None  Augmentation: None  Complications: None  Hospital course:  Onset of Labor With Vaginal Delivery     34y.o. yo [id number] admitted in Latent Labor on 04/13/2019. Patient had an uncomplicated labor course as follows: Patient arrived after SROM and SOL at home with initial SVE 5/80/-2. She quickly progressed to complete in less than one hour with uncomplicated delivery. Membrane Rupture Time/Date: 4:30 AM ,04/13/2019   Intrapartum Procedures: Episiotomy: None [1]                                         Lacerations:  1st degree [2]  Patient had a delivery of a Viable infant. 04/13/2019  Information for the patient's newborn:  SeYachet, Mattson0[[id number]]Delivery Method: Vaginal, Spontaneous(Filed from Delivery Summary)     Pateint had an uncomplicated postpartum course.  She is ambulating, tolerating a regular diet, passing flatus, and urinating well. Micronor prescribed on discharge. Patient is discharged home in stable condition on 04/15/19.  Delivery time: 7:25 AM    Magnesium Sulfate received: No BMZ received: No Rhophylac:No MMR:No Transfusion:No  Physical exam  Vitals:   04/13/19 2339 04/14/19 0518 04/14/19 1430 04/14/19 2134  BP: (!) 104/57 105/71 104/60  113/76  Pulse: 77 72 73 76  Resp: '18 18 18 16  ' Temp: 97.8 F (36.6 C) 97.8 F (36.6 C) 98.2 F (36.8 C) 97.9 F (36.6 C)  TempSrc: Oral Oral Oral Oral  SpO2:       General: alert, cooperative and no distress Lochia: appropriate Uterine Fundus: firm Incision: N/A DVT Evaluation: No evidence of DVT seen on physical exam. Labs: Lab Results  Component Value Date   WBC 5.1 04/13/2019   HGB 12.0 04/13/2019   HCT 38.4 04/13/2019   MCV 85.7 04/13/2019   PLT 159 04/13/2019   CMP Latest Ref Rng & Units 04/11/2017  Glucose 65 - 99 mg/dL 84  BUN 6 - 20 mg/dL 9  Creatinine 0.57 - 1.00 mg/dL 0.66  Sodium 134 - 144 mmol/L 138  Potassium 3.5 - 5.2 mmol/L 3.8  Chloride 96 - 106 mmol/L 97  CO2 20 - 29 mmol/L 25  Calcium 8.7 - 10.2 mg/dL 9.8  Total Protein 6.0 - 8.5 g/dL 7.4  Total  Bilirubin 0.0 - 1.2 mg/dL 0.3  Alkaline Phos 39 - 117 IU/L 72  AST 0 - 40 IU/L 16  ALT 0 - 32 IU/L 17   Edinburgh Score: Edinburgh Postnatal Depression Scale Screening Tool 02/10/2017  I have been able to laugh and see the funny side of things. 0  I have looked forward with enjoyment to things. 0  I have blamed myself unnecessarily when things went wrong. 2  I have been anxious or worried for no good reason. 1  I have felt scared or panicky for no good reason. 1  Things have been getting on top of me. 1  I have been so unhappy that I have had difficulty sleeping. 0  I have felt sad or miserable. 0  I have been so unhappy that I have been crying. 0  The thought of harming myself has occurred to me. 0  Edinburgh Postnatal Depression Scale Total 5    Discharge instruction: per After Visit Summary and "Baby and Me Booklet".  After visit meds:  Allergies as of 04/15/2019   No Known Allergies     Medication List    TAKE these medications   acetaminophen 325 MG tablet Commonly known as: Tylenol Take 2 tablets (650 mg total) by mouth every 6 (six) hours as needed (for pain scale < 4). What changed:    medication strength  how much to take  reasons to take this   ibuprofen 600 MG tablet Commonly known as: ADVIL Take 1 tablet (600 mg total) by mouth every 6 (six) hours.   norethindrone 0.35 MG tablet Commonly known as: MICRONOR Take 1 tablet (0.35 mg total) by mouth daily.   prenatal multivitamin Tabs tablet Take 1 tablet by mouth at bedtime.   senna-docusate 8.6-50 MG tablet Commonly known as: Senokot-S Take 2 tablets by mouth daily. Start taking on: April 16, 2019   sertraline 50 MG tablet Commonly known as: ZOLOFT TAKE 1 TABLET(50 MG) BY MOUTH DAILY What changed:   how much to take  how to take this  when to take this   simethicone 80 MG chewable tablet Commonly known as: Gas-X Chew 1 tablet (80 mg total) by mouth every 6 (six) hours as needed for flatulence.       Diet: routine diet  Activity: Advance as tolerated. Pelvic rest for 6 weeks.   Outpatient follow up:4 weeks Follow up Appt: Future Appointments  Date Time Provider [place]  04/16/2019 11:15 AM Aletha Halim, MD [facility]  08/22/2019  3:00 PM Valerie Roys, DO [facility]   Follow up Visit:    Please schedule this patient for Postpartum visit in: 4 weeks with the following provider: Any provider Virtual For C/S patients schedule nurse incision check in weeks 2 weeks: no Low risk pregnancy complicated by: none Delivery mode:  SVD Anticipated Birth Control:  POPs PP Procedures needed: none  Schedule Integrated BH visit: no   Newborn Data: Live born female  Birth Weight: 3164g  APGAR: 4, 9  Newborn Delivery   Birth date/time: 04/13/2019 07:25:00 Delivery type: Vaginal, Spontaneous      Baby Feeding: Breast Disposition:home with mother   04/15/2019 Chauncey Mann, MD

## 2019-04-13 NOTE — MAU Note (Signed)
Patient reports to MAU c/o SROM @0430 . Pt is having ctx every 5-7 min. No bleeding. Pt has not felt baby move yet today. Pt reports her pain is a 10/10.

## 2019-04-13 NOTE — H&P (Addendum)
LABOR AND DELIVERY ADMISSION HISTORY AND PHYSICAL NOTE  Angela [name] is a 34 y.o. female G3P1011 with IUP at [redacted]w[redacted]d by LMP and 10 wk [place] presenting for SOL with possible SROM.  She reports positive fetal movement and leakage of fluid. She denies vaginal bleeding.  Prenatal History/Complications: PNC at [facility] Pregnancy complications:  - R CPC seen on [place], resolved - Low-lying placenta resolved  Past Medical History: Past Medical History:  Diagnosis Date  . Bilateral carpal tunnel syndrome 11/18/2015  . Cholestasis of pregnancy in third trimester 01/11/2017  . Depression with anxiety 05/07/2014  . GERD (gastroesophageal reflux disease)   . IBS (irritable bowel syndrome)   . Migraines   . Missed abortion 03/26/2014  . Multiple thyroid nodules   . Painful menstrual periods 11/13/2014  . Raynaud disease   . Raynaud's disease     Past Surgical History: Past Surgical History:  Procedure Laterality Date  . COLONOSCOPY  11/03/11   [place]:colonic mucosa appeared normal  . DG SELECTED HSG GDC ONLY    . WISDOM TOOTH EXTRACTION      Obstetrical History: OB History    Gravida  3   Para  1   Term  1   Preterm  0   AB  1   Living  1     SAB  1   TAB  0   Ectopic  0   Multiple  0   Live Births  1           Social History: Social History   Socioeconomic History  . Marital status: Single    Spouse name: Not on file  . Number of children: 1  . Years of education: college  . Highest education level: Bachelor's degree (e.g., BA, AB, BS)  Occupational History  . Occupation: Education officer, museum  Tobacco Use  . Smoking status: Former Smoker    Packs/day: 0.25    Years: 10.00    Pack years: 2.50    Types: Cigarettes    Quit date: 04/14/2016    Years since quitting: 2.9  . Smokeless tobacco: Never Used  . Tobacco comment: 5 cigs per day  Substance and Sexual Activity  . Alcohol use: No    Comment: occ wine  . Drug use: No  . Sexual activity: Yes    Birth  control/protection: None  Other Topics Concern  . Not on file  Social History Narrative  . Not on file   Social Determinants of Health   Financial Resource Strain: Low Risk   . Difficulty of Paying Living Expenses: Not hard at all  Food Insecurity: No Food Insecurity  . Worried About Charity fundraiser in the Last Year: Never true  . Ran Out of Food in the Last Year: Never true  Transportation Needs: No Transportation Needs  . Lack of Transportation (Medical): No  . Lack of Transportation (Non-Medical): No  Physical Activity: Inactive  . Days of Exercise per Week: 0 days  . Minutes of Exercise per Session: 0 min  Stress: Stress Concern Present  . Feeling of Stress : To some extent  Social Connections: Slightly Isolated  . Frequency of Communication with Friends and Family: More than three times a week  . Frequency of Social Gatherings with Friends and Family: More than three times a week  . Attends Religious Services: More than 4 times per year  . Active Member of Clubs or Organizations: No  . Attends Archivist Meetings: Never  .  Marital Status: Married    Family History: Family History  Problem Relation Age of Onset  . Diabetes Father   . Heart disease Father   . Alcohol abuse Father   . Hypertension Father   . Rheum arthritis Mother   . Arthritis Mother        rheumatoid  . Diabetes Paternal Grandfather   . Congestive Heart Failure Paternal Grandfather   . Alzheimer's disease Paternal Grandmother   . Other Maternal Grandmother        obstruction in stomach  . Stroke Maternal Grandfather   . Diabetes Maternal Grandfather   . Diabetes Sister   . Hypertension Sister   . Hypertension Brother   . Autism Cousin   . Autism Cousin   . Colon cancer Neg Hx   . Esophageal cancer Neg Hx   . Rectal cancer Neg Hx   . Stomach cancer Neg Hx     Allergies: No Known Allergies  Medications Prior to Admission  Medication Sig Dispense Refill Last Dose  .  Prenatal Vit-Fe Fumarate-FA (PRENATAL MULTIVITAMIN) TABS tablet Take 1 tablet by mouth daily at 12 noon.   04/12/2019 at Unknown time  . sertraline (ZOLOFT) 50 MG tablet TAKE 1 TABLET(50 MG) BY MOUTH DAILY 90 tablet 3 04/12/2019 at Unknown time  . simethicone (GAS-X) 80 MG chewable tablet Chew 1 tablet (80 mg total) by mouth every 6 (six) hours as needed for flatulence. 30 tablet 6 04/12/2019 at Unknown time  . cyclobenzaprine (FLEXERIL) 10 MG tablet Take 1 tablet (10 mg total) by mouth every 8 (eight) hours as needed for muscle spasms. (Patient not taking: Reported on 02/27/2019) 30 tablet 1      Review of Systems  All systems reviewed and negative except as stated in HPI  Physical Exam Blood pressure 105/73, pulse 73, temperature 98.5 F (36.9 C), temperature source Oral, resp. rate 18, last menstrual period 07/20/2018, SpO2 100 %, currently breastfeeding. General appearance: alert, oriented, NAD Lungs: normal respiratory effort Heart: regular rate Abdomen: soft, non-tender; gravid, FH appropriate for GA Extremities: No calf swelling or tenderness Presentation: cephalic Fetal monitoring: 145 bpm, moderate variability, 15x15 accels, no decels Uterine activity: q1-2 minutes Dilation: 5 Effacement (%): 80 Station: -2 Exam by:: Ralene Bathe, RN  Prenatal labs: ABO, Rh: O/Positive/-- (08/17 1641) Antibody: Negative (08/17 1641) Rubella: 2.49 (08/17 1641) RPR: Non Reactive (12/18 0841)  HBsAg: Negative (08/17 1641)  HIV: Non Reactive (12/18 0841)  GC/Chlamydia: Negative/Negative (04/03/2019) GBS: --[name] (02/17 1629)  2-hr GTT: Normal (02/01/2019) Genetic screening:  Alpha-Thal silent carrier, otherwise negative (10/01/2018) Anatomy US: Resolved right Choroid Plexus Cyst (03/15/2019), normal scan, placenta posterior  Prenatal Transfer Tool  Maternal Diabetes: No Genetic Screening: Abnormal:  Results: Other: Alpha-Thal silent carrier, otherwise negative Maternal  Ultrasounds/Referrals: Normal Fetal Ultrasounds or other Referrals:  None Maternal Substance Abuse:  No Significant Maternal Medications:  None Significant Maternal Lab Results: Group B Strep positive  Results for orders placed or performed during the hospital encounter of 04/13/19 (from the past 24 hour(s))  Fern Test   Collection Time: 04/13/19  6:36 AM  Result Value Ref Range   POCT Fern Test Negative = intact amniotic membranes     Patient Active Problem List   Diagnosis Date Noted  . Labor and delivery, indication for care 04/13/2019  . GBS (group B streptococcus) infection 04/08/2019  . Low-lying placenta in third trimester-RESOLVED @34wk  01/29/2019  . Supervision of other normal pregnancy, antepartum 10/01/2018  . Stress incontinence of urine 03/15/2017  .  Gastroesophageal reflux disease without esophagitis 10/07/2016  . Epistaxis 07/21/2016  . Bilateral carpal tunnel syndrome 11/18/2015  . IBS (irritable bowel syndrome) 05/07/2014  . Raynaud disease 05/07/2014  . Depression with anxiety 05/07/2014    Assessment: Angela [name] is a 34 y.o. G3P1011 at [redacted]w[redacted]d here for SOL and SROM.   #Labor: ACTIVE, pushing #Pain: 10/10 pain, completely dilated, no time for epidural #FWB: Category 1 (145 bpm, moderate variability, 15x15 accels, no decels) #ID:  GBS positive - NOT ADEQUATELY TREATED #MOF: Breast #MOC:POPs #Circ:  YES  Dollene Cleveland 04/13/2019, 6:55 AM  I saw and evaluated the patient. I agree with the findings and the plan of care as documented in the resident's note. Patient arrived after SROM and SOL at home with initial SVE 5/80/-2. She quickly progressed to complete in less than one hour with uncomplicated delivery. Inadequate GBS ppx.   Jerilynn Birkenhead, MD [facility] Family Medicine Fellow, [facility] for [employer], [facility]

## 2019-04-13 NOTE — Lactation Note (Signed)
This note was copied from a baby's chart. Lactation Consultation Note  Patient Name: Angela [name] BHALP'F Date: 04/13/2019 Reason for consult: Initial assessment;Early term 37-38.6wks P2.  Mom breastfed her first baby for 18 months.  Newborn is 6 hours old and latching to right breast easily.  Mom reports he has a more difficult time on left side.  Mom concerned he is hungry and "only getting colostrum".  Mom trying to use her manual pump for stimulation.  Reassured mom and instructed to put back to breast if still showing feeding cues.  Instructed to use breast massage during feeding.  Baby is currently showing feeding cues and mom easily latched him to right breast.  Baby actively sucking with a few swallows.  Encouraged mom to call for latch assist on left if needed.  Recommended football hold.  Breastfeeding consultation services information given and reviewed.  Maternal Data Does the patient have breastfeeding experience prior to this delivery?: Yes  Feeding    LATCH Score Latch: Grasps breast easily, tongue down, lips flanged, rhythmical sucking.  Audible Swallowing: A few with stimulation  Type of Nipple: Everted at rest and after stimulation  Comfort (Breast/Nipple): Filling, red/small blisters or bruises, mild/mod discomfort  Hold (Positioning): No assistance needed to correctly position infant at breast.  LATCH Score: 8  Interventions    Lactation Tools Discussed/Used     Consult Status Consult Status: Follow-up Date: 04/14/19 Follow-up type: In-patient    Huston Foley 04/13/2019, 2:07 PM

## 2019-04-14 DIAGNOSIS — Z412 Encounter for routine and ritual male circumcision: Secondary | ICD-10-CM | POA: Diagnosis not present

## 2019-04-14 DIAGNOSIS — Z9189 Other specified personal risk factors, not elsewhere classified: Secondary | ICD-10-CM | POA: Diagnosis not present

## 2019-04-14 NOTE — Progress Notes (Signed)
MOB was referred for history of depression/anxiety. * Referral screened out by Clinical Social Worker because none of the following criteria appear to apply: ~ History of anxiety/depression during this pregnancy, or of post-partum depression following prior delivery. ~ Diagnosis of anxiety and/or depression within last 3 years OR * MOB's symptoms currently being treated with medication and/or therapy. MOB taking active RX for Zoloft 50 mg and prenatal notes state "Under good control on current regimen."  Please contact the Clinical Social Worker if needs arise, by MOB request, or if MOB scores greater than 9/yes to question 10 on Edinburgh Postpartum Depression Screen.  Angela [name], MSW, LCSWA Clinical Social Worker [phone] 

## 2019-04-14 NOTE — Progress Notes (Signed)
Post Partum Day 1 Subjective: Patient reports feeling well. She is tolerating PO. Ambulating and urinating without difficulty. Lochia minimal.  Objective: Blood pressure 105/71, pulse 72, temperature 97.8 F (36.6 C), temperature source Oral, resp. rate 18, last menstrual period 07/20/2018, SpO2 100 %, unknown if currently breastfeeding.  Physical Exam:  General: alert, cooperative and appears stated age Lochia: appropriate Uterine Fundus: firm Incision: NA DVT Evaluation: No evidence of DVT seen on physical exam.  Recent Labs    04/13/19 0622  HGB 12.0  HCT 38.4    Assessment/Plan: Plan for discharge tomorrow; infant without adequate GBS ppx POPs on discharge Breastfeeding Vitals stable    LOS: 1 day   Aryanah Enslow N Shaneka Efaw 04/14/2019, 5:56 AM

## 2019-04-14 NOTE — Lactation Note (Signed)
This note was copied from a baby's chart. Lactation Consultation Note  Patient Name: Boy Florencia Zaccaro PFRHZ'[id number] Date: 04/14/2019  P2, 18 hour female infant. Per mom, infant is starting to latch well at breast, and feeding 10 to 15 minutes most feeding now. LC did not observe latch at this time,  infant is asleep in basinet. Infant last breastfed at 12:45 am.  LC reviewed hand expression and mom easily expressed 6 mls of colostrum in a bullet. Mom plans to breast feed and offer EBM  That she expressed afterwards on a spoon, LC reassured mom she has enough supply for infant and infant's small tummy size was discussed.  Mom will continue to breastfeed infant according to hunger cues, 8 to 12 times within 24 hours of life and and not to exceed 3 hours without breastfeeding infant. Mom knows to call RN or LC if she needs assistance with latching infant at breast.   Maternal Data    Feeding Feeding Type: Breast Fed  LATCH Score             Interventions Interventions: Breast feeding basics reviewed  Lactation Tools Discussed/Used     Consult Status      Danelle Earthly 04/14/2019, 1:35 AM

## 2019-04-15 MED ORDER — SENNOSIDES-DOCUSATE SODIUM 8.6-50 MG PO TABS: 2.0000 | ORAL_TABLET | ORAL | 0 refills | Status: DC

## 2019-04-15 MED ORDER — ACETAMINOPHEN 325 MG PO TABS: 650.0000 mg | ORAL_TABLET | Freq: Four times a day (QID) | ORAL | 0 refills | Status: DC | PRN

## 2019-04-15 MED ORDER — IBUPROFEN 600 MG PO TABS: 600.0000 mg | ORAL_TABLET | Freq: Four times a day (QID) | ORAL | 0 refills | Status: DC

## 2019-04-15 MED ORDER — NORETHINDRONE 0.35 MG PO TABS: 1.0000 | ORAL_TABLET | Freq: Every day | ORAL | 11 refills | Status: DC

## 2019-04-16 ENCOUNTER — Telehealth: Payer: BC Managed Care – PPO | Admitting: Obstetrics and Gynecology

## 2019-04-22 ENCOUNTER — Encounter: Payer: Self-pay | Admitting: Radiology

## 2019-05-15 ENCOUNTER — Other Ambulatory Visit: Payer: Self-pay

## 2019-05-15 ENCOUNTER — Ambulatory Visit (INDEPENDENT_AMBULATORY_CARE_PROVIDER_SITE_OTHER): Payer: BC Managed Care – PPO | Admitting: Advanced Practice Midwife

## 2019-05-15 ENCOUNTER — Encounter: Payer: Self-pay | Admitting: Advanced Practice Midwife

## 2019-05-15 NOTE — Progress Notes (Signed)
    Post Partum Visit Note  Angela [name] is a 34 y.o. G47P2012 female who presents for a postpartum visit. She is 4 weeks 4 days postpartum following a normal spontaneous vaginal delivery.  I have fully reviewed the prenatal and intrapartum course. The delivery was at 38.1 gestational weeks.  Anesthesia: none. Postpartum course has been uncomplicated. Baby is doing well. Baby is feeding by breast. Bleeding thin lochia and red. Bowel function is normal. Bladder function is normal. Patient is not sexually active. Contraception method is none. Micronor was prescribed at hospital discharge but patient has not initiated the medication yet. Postpartum depression screening: negative.  The following portions of the patient's history were reviewed and updated as appropriate: allergies, current medications, past family history, past medical history, past social history, past surgical history and problem list.  Review of Systems A comprehensive review of systems was negative except for: Genitourinary: positive for vaginal spotting    Objective:  Blood pressure 117/76, pulse 74, weight 143 lb 9.6 oz (65.1 kg), unknown if currently breastfeeding.  General:  alert, cooperative, appears stated age and no distress   Breasts:  inspection negative, no nipple discharge or bleeding, no masses or nodularity palpable  Lungs: clear to auscultation bilaterally  Heart:  regular rate   Abdomen: soft, non-tender; bowel sounds normal; no masses,  no organomegaly   Vulva:  normal  Vagina: vagina positive for scant red-tinged mucus  Cervix:  no lesions  Rectal Exam: Not performed.        Assessment:   --Normal postpartum exam.  --Discussed with patient that irregular spotting can still occur in the setting of exclusive breastfeeding.  --Pelvic exam to r/u retained POCs ordered. Reviewed signs of worsening acuity, report to MAU for eval PRN --Pap smear not performed at today's visit.   Plan:   Essential  components of care per ACOG recommendations:  1.  Mood and well being: Patient with negative depression screening today. Reviewed local resources for support.  - Patient does not use tobacco. - hx of drug use? No    2. Infant care and feeding:  -Patient currently breastmilk feeding? Yes   3. Sexuality, contraception and birth spacing - Patient does not want a pregnancy in the next year.  - Reviewed forms of contraception in tiered fashion. Patient desired oral progesterone-only contraceptive today.    4. Sleep and fatigue -Encouraged family/partner/community support of 4 hrs of uninterrupted sleep to help with mood and fatigue  5. Physical Recovery  - Discussed patients delivery  - Patient had a 1st degree laceration.  - Patient has urinary incontinence? No - Patient is safe to resume physical and sexual activity  6.  Health Maintenance - Last pap smear done 07/05/2017 and was normal with negative HPV.  Calvert Cantor, CNM Center for [employer], [facility]

## 2019-05-15 NOTE — Patient Instructions (Signed)
Perinatal Anxiety When a woman feels excessive tension or worry (anxiety) during pregnancy or during the first 12 months after she gives birth, she has a condition called perinatal anxiety. Anxiety can interfere with work, school, relationships, and other everyday activities. If it is not managed properly, it can also cause problems in the mother and her baby.  If you are pregnant and you have symptoms of an anxiety disorder, it is important to talk with your health care provider. What are the causes? The exact cause of this condition is not known. Hormonal changes during and after pregnancy may play a role in causing perinatal anxiety. What increases the risk? You are more likely to develop this condition if:  You have a personal or family history of depression, anxiety, or mood disorders.  You experience a stressful life event during pregnancy, such as the death of a loved one.  You have a lot of regular life stress, such as being a single parent.  You have thyroid problems. What are the signs or symptoms? Perinatal anxiety can be different for everyone. It may include:  Panic attacks (panic disorder). These are intense episodes of fear or discomfort that may also cause sweating, nausea, shortness of breath, or fear of dying. They usually last 5-15 minutes.  Reliving an upsetting (traumatic) event through distressing thoughts, dreams, or flashbacks (post-traumatic stress disorder, or PTSD).  Excessive worry about multiple problems (generalized anxiety disorder).  Fear and stress about leaving certain people or loved ones (separation anxiety).  Performing repetitive tasks (compulsions) to relieve stress or worry (obsessive compulsive disorder, or OCD).  Fear of certain objects or situations (phobias).  Excessive worrying, such as a constant feeling that something bad is going to happen.  Inability to relax.  Difficulty concentrating.  Sleep problems.  Frequent nightmares or  disturbing thoughts. How is this diagnosed? This condition is diagnosed based on a physical exam and mental evaluation. In some cases, your health care provider may use an anxiety screening tool. These tools include a list of questions that can help a health care provider diagnose anxiety. Your health care provider may refer you to a mental health expert who specializes in anxiety. How is this treated? This condition may be treated with:  Medicines. Your health care provider will only give you medicines that have been proven safe for pregnancy and breastfeeding.  Talk therapy with a mental health professional to help change your patterns of thinking (cognitive behavioral therapy).  Mindfulness-based stress reduction.  Other relaxation therapies, such as deep breathing or guided muscle relaxation.  Support groups. Follow these instructions at home: Lifestyle  Do not use any products that contain nicotine or tobacco, such as cigarettes and e-cigarettes. If you need help quitting, ask your health care provider.  Do not use alcohol when you are pregnant. After your baby is born, limit alcohol intake to no more than 1 drink a day. One drink equals 12 oz of beer, 5 oz of wine, or 1 oz of hard liquor.  Consider joining a support group for new mothers. Ask your health care provider for recommendations.  Take good care of yourself. Make sure you: ? Get plenty of sleep. If you are having trouble sleeping, talk with your health care provider. ? Eat a healthy diet. This includes plenty of fruits and vegetables, whole grains, and lean proteins. ? Exercise regularly, as told by your health care provider. Ask your health care provider what exercises are safe for you. General instructions  Take over-the-counter  and prescription medicines only as told by your health care provider.  Talk with your partner or family members about your feelings during pregnancy. Share any concerns or fears that you may  have.  Ask for help with tasks or chores when you need it. Ask friends and family members to provide meals, watch your children, or help with cleaning.  Keep all follow-up visits as told by your health care provider. This is important. Contact a health care provider if:  You (or people close to you) notice that you have any symptoms of anxiety or depression.  You have anxiety and your symptoms get worse.  You experience side effects from medicines, such as nausea or sleep problems. Get help right away if:  You feel like hurting yourself, your baby, or someone else. If you ever feel like you may hurt yourself or others, or have thoughts about taking your own life, get help right away. You can go to your nearest emergency department or call:  Your local emergency services (911 in the [place]  A suicide crisis helpline, such as the [place] at 3097784915. This is open 24 hours a day. Summary  Perinatal anxiety is when a woman feels excessive tension or worry during pregnancy or during the first 12 months after she gives birth.  Perinatal anxiety may include panic attacks, post-traumatic stress disorder, separation anxiety, phobias, or generalized anxiety.  Perinatal anxiety can cause physical health problems in the mother and baby if not properly managed.  This condition is treated with medicines, talk therapy, stress reduction therapies, or a combination of two or more treatments.  Talk with your partner or family members about your concerns or fears. Do not be afraid to ask for help. This information is not intended to replace advice given to you by your health care provider. Make sure you discuss any questions you have with your health care provider. Document Revised: 02/03/2017 Document Reviewed: 03/30/2016 Elsevier Patient Education  2020 [employer] 12-33 Years Old, Female Preventive care refers to visits with your health  care provider and lifestyle choices that can promote health and wellness. This includes:  A yearly physical exam. This may also be called an annual well check.  Regular dental visits and eye exams.  Immunizations.  Screening for certain conditions.  Healthy lifestyle choices, such as eating a healthy diet, getting regular exercise, not using drugs or products that contain nicotine and tobacco, and limiting alcohol use. What can I expect for my preventive care visit? Physical exam Your health care provider will check your:  Height and weight. This may be used to calculate body mass index (BMI), which tells if you are at a healthy weight.  Heart rate and blood pressure.  Skin for abnormal spots. Counseling Your health care provider may ask you questions about your:  Alcohol, tobacco, and drug use.  Emotional well-being.  Home and relationship well-being.  Sexual activity.  Eating habits.  Work and work Statistician.  Method of birth control.  Menstrual cycle.  Pregnancy history. What immunizations do I need?  Influenza (flu) vaccine  This is recommended every year. Tetanus, diphtheria, and pertussis (Tdap) vaccine  You may need a Td booster every 10 years. Varicella (chickenpox) vaccine  You may need this if you have not been vaccinated. Human papillomavirus (HPV) vaccine  If recommended by your health care provider, you may need three doses over 6 months. Measles, mumps, and rubella (MMR) vaccine  You may need  at least one dose of MMR. You may also need a second dose. Meningococcal conjugate (MenACWY) vaccine  One dose is recommended if you are age 75-21 years and a first-year college student living in a residence hall, or if you have one of several medical conditions. You may also need additional booster doses. Pneumococcal conjugate (PCV13) vaccine  You may need this if you have certain conditions and were not previously vaccinated. Pneumococcal  polysaccharide (PPSV23) vaccine  You may need one or two doses if you smoke cigarettes or if you have certain conditions. Hepatitis A vaccine  You may need this if you have certain conditions or if you travel or work in places where you may be exposed to hepatitis A. Hepatitis B vaccine  You may need this if you have certain conditions or if you travel or work in places where you may be exposed to hepatitis B. Haemophilus influenzae type b (Hib) vaccine  You may need this if you have certain conditions. You may receive vaccines as individual doses or as more than one vaccine together in one shot (combination vaccines). Talk with your health care provider about the risks and benefits of combination vaccines. What tests do I need?  Blood tests  Lipid and cholesterol levels. These may be checked every 5 years starting at age 57.  Hepatitis C test.  Hepatitis B test. Screening  Diabetes screening. This is done by checking your blood sugar (glucose) after you have not eaten for a while (fasting).  Sexually transmitted disease (STD) testing.  BRCA-related cancer screening. This may be done if you have a family history of breast, ovarian, tubal, or peritoneal cancers.  Pelvic exam and Pap test. This may be done every 3 years starting at age 48. Starting at age 70, this may be done every 5 years if you have a Pap test in combination with an HPV test. Talk with your health care provider about your test results, treatment options, and if necessary, the need for more tests. Follow these instructions at home: Eating and drinking   Eat a diet that includes fresh fruits and vegetables, whole grains, lean protein, and low-fat dairy.  Take vitamin and mineral supplements as recommended by your health care provider.  Do not drink alcohol if: ? Your health care provider tells you not to drink. ? You are pregnant, may be pregnant, or are planning to become pregnant.  If you drink  alcohol: ? Limit how much you have to 0-1 drink a day. ? Be aware of how much alcohol is in your drink. In the [place] one drink equals one 12 oz bottle of beer (355 mL), one 5 oz glass of wine (148 mL), or one 1 oz glass of hard liquor (44 mL). Lifestyle  Take daily care of your teeth and gums.  Stay active. Exercise for at least 30 minutes on 5 or more days each week.  Do not use any products that contain nicotine or tobacco, such as cigarettes, e-cigarettes, and chewing tobacco. If you need help quitting, ask your health care provider.  If you are sexually active, practice safe sex. Use a condom or other form of birth control (contraception) in order to prevent pregnancy and STIs (sexually transmitted infections). If you plan to become pregnant, see your health care provider for a preconception visit. What's next?  Visit your health care provider once a year for a well check visit.  Ask your health care provider how often you should have your eyes and  your eyes and teeth checked.  Stay up to date on all vaccines. This information is not intended to replace advice given to you by your health care provider. Make sure you discuss any questions you have with your health care provider. Document Revised: 10/12/2017 Document Reviewed: 10/12/2017 Elsevier Patient Education  2020 [employer] Inc[employer]  

## 2019-05-16 LAB — CBC
Hematocrit: 40.8 % (ref 34.0–46.6)
Hemoglobin: 13.1 g/dL (ref 11.1–15.9)
MCH: 26.2 pg — ABNORMAL LOW (ref 26.6–33.0)
MCHC: 32.1 g/dL (ref 31.5–35.7)
MCV: 82 fL (ref 79–97)
Platelets: 163 10*3/uL (ref 150–450)
RBC: 5 x10E6/uL (ref 3.77–5.28)
RDW: 13.3 % (ref 11.7–15.4)
WBC: 2.9 10*3/uL — ABNORMAL LOW (ref 3.4–10.8)

## 2019-05-16 LAB — BETA HCG QUANT (REF LAB): hCG Quant: <1 m[IU]/mL

## 2019-05-20 ENCOUNTER — Ambulatory Visit: Admission: RE | Admit: 2019-05-20 | Payer: BC Managed Care – PPO | Source: Ambulatory Visit

## 2019-08-22 ENCOUNTER — Encounter: Payer: Self-pay | Admitting: Family Medicine

## 2019-09-03 ENCOUNTER — Encounter: Payer: Self-pay | Admitting: Family Medicine

## 2019-10-11 ENCOUNTER — Ambulatory Visit: Payer: Self-pay | Admitting: *Deleted

## 2019-10-11 NOTE — Telephone Encounter (Signed)
Scheduled virtual for monday

## 2019-10-11 NOTE — Telephone Encounter (Signed)
appt

## 2019-10-11 NOTE — Telephone Encounter (Signed)
Pt called stating that she is on day 11 of covid. She states that she has felt better, but all of a sudden she started having pressure in the middle of her face. Please advise. Patient c/o headache and facial pressure starting today. Patient is on day 10 of covid. patient reports she is prone to sinus infections but thought since having covid it was relating to covid but did not have this symptom before. Patient denies difficulty breathing , SOB or chest pain. Patient wants to know best thing for her to take for new symptoms due to her breastfeeding 39 month old. Recommended to call pharmacists for options. Care advise given. Patient verbalized understanding of care advise and to call back or go to [facility] or ED if symptoms worsen. Please advise.  Reason for Disposition . [1] PERSISTING SYMPTOMS OF COVID-19 AND [2] NEW symptom AND [3] that sounds mild . [1] Caller has NON-URGENT question AND [2] triager unable to answer  Answer Assessment - Initial Assessment Questions 1. COVID-19 ONSET: "When did the symptoms of COVID-19 first start?"     Greater than 10 days ago 3. MAIN SYMPTOM:  "What is your main concern or symptom right now?" (e.g., breathing difficulty, cough, fatigue. loss of smell)     Started feeling better now have headache with pressure in my face, still coughing with some congestion 4. SYMPTOM ONSET: "When did the headache   start?"     today 5. BETTER-SAME-WORSE: "Are you getting better, staying the same, or getting worse over the last 1 to 2 weeks?"     Headache is worse 6. RECENT MEDICAL VISIT: "Have you been seen by a healthcare provider (doctor, NP, PA) for these persisting COVID-19 symptoms?" If Yes, ask: "When were you seen?" (e.g., date)     No, patient had baby 6 months ago and is breastfeeding 7. COUGH: "Do you have a cough?" If Yes, ask: "How bad is the cough?"       Yes  8. FEVER: "Do you have a fever?" If Yes, ask: "What is your temperature, how was it measured, and when did it  start?"     no 9. BREATHING DIFFICULTY: "Are you having any trouble breathing?" If Yes, ask: "How bad is your breathing?" (e.g., mild, moderate, severe)    - MILD: No SOB at rest, mild SOB with walking, speaks normally in sentences, can lay down, no retractions, pulse < 100.    - MODERATE: SOB at rest, SOB with minimal exertion and prefers to sit, cannot lie down flat, speaks in phrases, mild retractions, audible wheezing, pulse 100-120.    - SEVERE: Very SOB at rest, speaks in single words, struggling to breathe, sitting hunched forward, retractions, pulse > 120       no 10. HIGH RISK DISEASE: "Do you have any chronic medical problems?" (e.g., asthma, heart or lung disease, weak immune system, obesity, etc.)       no 11. PREGNANCY: "Is there any chance you are pregnant?" "When was your last menstrual period?"       Not pregnant. Had baby 6 months ago 12. OTHER SYMPTOMS: "Do you have any other symptoms?"  (e.g., fatigue, headache, muscle pain, weakness)    Headache, facial pressure, cough  Protocols used: CORONAVIRUS (COVID-19) PERSISTING SYMPTOMS FOLLOW-UP CALL-A-AH

## 2019-10-14 ENCOUNTER — Telehealth (INDEPENDENT_AMBULATORY_CARE_PROVIDER_SITE_OTHER): Payer: Managed Care, Other (non HMO) | Admitting: Family Medicine

## 2019-10-14 ENCOUNTER — Encounter: Payer: Self-pay | Admitting: Family Medicine

## 2019-10-14 VITALS — Temp 97.8°F

## 2019-10-14 DIAGNOSIS — U071 COVID-19: Secondary | ICD-10-CM | POA: Diagnosis not present

## 2019-10-14 MED ORDER — PREDNISONE 50 MG PO TABS: 50.0000 mg | ORAL_TABLET | Freq: Every day | ORAL | 0 refills | Status: DC

## 2019-10-14 NOTE — Progress Notes (Signed)
Temp 97.8 F (36.6 C)    Subjective:    Patient ID: Angela [name], female    DOB: 02-May-1985, 34 y.o.   MRN: [id number]  HPI: Angela [name] is a 34 y.o. female presenting on 10/14/2019 for Covid 19 and Cough (dry )   UPPER RESPIRATORY TRACT INFECTION Duration: about 2 weeks Worst symptom: headache facial pain, cough  Fever: no Cough: yes Shortness of breath: no Wheezing: no Chest pain: no Chest tightness: no Chest congestion: no Nasal congestion: yes Runny nose: yes Post nasal drip: yes Sneezing: no Sore throat: no Swollen glands: no Sinus pressure: no Headache: no Face pain: no Toothache: no Ear pain: no  Ear pressure: no  Eyes red/itching:no Eye drainage/crusting: no  Vomiting: no Rash: no Fatigue: no Sick contacts: yes Strep contacts: no  Context: better Recurrent sinusitis: no Relief with OTC cold/cough medications: yes  Treatments attempted: cold/sinus, mucinex and anti-histamine   Relevant past medical, surgical, family and social history reviewed and updated as indicated. Interim medical history since our last visit reviewed. Allergies and medications reviewed and updated.  Current Outpatient Medications on File Prior to Visit  Medication Sig  . acetaminophen (TYLENOL) 325 MG tablet Take 2 tablets (650 mg total) by mouth every 6 (six) hours as needed (for pain scale < 4).  . norethindrone (MICRONOR) 0.35 MG tablet Take 1 tablet (0.35 mg total) by mouth daily.  . Prenatal Vit-Fe Fumarate-FA (PRENATAL MULTIVITAMIN) TABS tablet Take 1 tablet by mouth at bedtime.   . sertraline (ZOLOFT) 50 MG tablet TAKE 1 TABLET(50 MG) BY MOUTH DAILY   No current facility-administered medications on file prior to visit.    Review of Systems  Constitutional: Positive for fatigue. Negative for activity change, appetite change, chills, diaphoresis, fever and unexpected weight change.  HENT: Positive for congestion, postnasal drip and rhinorrhea. Negative for dental  problem, drooling, ear discharge, ear pain, facial swelling, hearing loss, mouth sores, nosebleeds, sinus pressure, sinus pain, sneezing, sore throat, tinnitus, trouble swallowing and voice change.   Respiratory: Positive for cough. Negative for apnea, choking, chest tightness, shortness of breath, wheezing and stridor.   Cardiovascular: Negative.   Gastrointestinal: Negative.   Neurological: Negative.   Psychiatric/Behavioral: Negative.     Per HPI unless specifically indicated above     Objective:    Temp 97.8 F (36.6 C)   Wt Readings from Last 3 Encounters:  05/15/19 143 lb 9.6 oz (65.1 kg)  04/04/19 156 lb (70.8 kg)  04/03/19 159 lb (72.1 kg)    Physical Exam Vitals and nursing note reviewed.  Constitutional:      General: She is not in acute distress.    Appearance: Normal appearance. She is not ill-appearing, toxic-appearing or diaphoretic.  HENT:     Head: Normocephalic and atraumatic.     Right Ear: External ear normal.     Left Ear: External ear normal.     Nose: Nose normal.     Mouth/Throat:     Mouth: Mucous membranes are moist.     Pharynx: Oropharynx is clear.  Eyes:     General: No scleral icterus.       Right eye: No discharge.        Left eye: No discharge.     Conjunctiva/sclera: Conjunctivae normal.     Pupils: Pupils are equal, round, and reactive to light.  Pulmonary:     Effort: Pulmonary effort is normal. No respiratory distress.     Comments: Speaking in full sentences  Musculoskeletal:        General: Normal range of motion.     Cervical back: Normal range of motion.  Skin:    Coloration: Skin is not jaundiced or pale.     Findings: No bruising, erythema, lesion or rash.  Neurological:     Mental Status: She is alert and oriented to person, place, and time. Mental status is at baseline.  Psychiatric:        Mood and Affect: Mood normal.        Behavior: Behavior normal.        Thought Content: Thought content normal.        Judgment:  Judgment normal.     Results for orders placed or performed in visit on 05/15/19  Beta hCG quant (ref lab)  Result Value Ref Range   hCG Quant <1 mIU/mL  CBC  Result Value Ref Range   WBC 2.9 (L) 3.4 - 10.8 x10E3/uL   RBC 5.00 3.77 - 5.28 x10E6/uL   Hemoglobin 13.1 11.1 - 15.9 g/dL   Hematocrit 31.5 17.6 - 46.6 %   MCV 82 79 - 97 fL   MCH 26.2 (L) 26.6 - 33.0 pg   MCHC 32.1 31 - 35 g/dL   RDW 16.0 73.7 - 10.6 %   Platelets 163 150 - 450 x10E3/uL      Assessment & Plan:   Problem List Items Addressed This Visit    None    Visit Diagnoses    COVID-19    -  Primary   Out of quarantine and feeling better. Will use sudafed or flonase for cough/congestion. If not working, will use prednisone. Call with any concerns.       Follow up plan: Return if symptoms worsen or fail to improve.   . This visit was completed via MyChart due to the restrictions of the COVID-19 pandemic. All issues as above were discussed and addressed. Physical exam was done as above through visual confirmation on MyChart. If it was felt that the patient should be evaluated in the office, they were directed there. The patient verbally consented to this visit. . Location of the patient: home . Location of the provider: work . Those involved with this call:  . Provider: Olevia Perches, DO . CMA: Floydene Flock, RMA . Front Desk/Registration: Adela Ports  . Time spent on call: 15 minutes with patient face to face via video conference. More than 50% of this time was spent in counseling and coordination of care. 23 minutes total spent in review of patient's record and preparation of their chart.

## 2019-10-17 ENCOUNTER — Encounter: Payer: Self-pay | Admitting: Family Medicine

## 2019-10-17 MED ORDER — PREDNISONE 50 MG PO TABS: 50.0000 mg | ORAL_TABLET | Freq: Every day | ORAL | 0 refills | Status: DC

## 2019-10-25 ENCOUNTER — Encounter: Payer: Self-pay | Admitting: Family Medicine

## 2019-11-19 ENCOUNTER — Other Ambulatory Visit: Payer: Self-pay | Admitting: Family Medicine

## 2019-11-19 MED ORDER — SERTRALINE HCL 50 MG PO TABS: ORAL_TABLET | ORAL | 3 refills | Status: DC

## 2019-11-19 NOTE — Telephone Encounter (Signed)
Please Advise.  KP

## 2019-11-19 NOTE — Telephone Encounter (Signed)
Phone call to pt. To discuss refill request.  Reported that during her video visit with Dr. [name], in August, 2021, she was advised to increase Zoloft 50 mg. To two tabs/ day.  Stated she is feeling so much better on the 2 tablets/ day, and "doesn't want to go back down to one tab per day, at this point. "  Reported she got a refill 2 weeks ago, and now is out of the medication.  [place] [facility].  Pt. Stated she will run out of this medication after today.  Advised pt. That nurse was not able to find documentation showing recommendation to increase her dose to two tabs/ day.  Will send to Dr. [name] to review / make recommendations.  Verb. Understanding.

## 2019-11-19 NOTE — Telephone Encounter (Signed)
Requested medication (s) are due for refill today:  No  Requested medication (s) are on the active medication list:  Yes  Future visit scheduled:  No   Last Refill:  02/21/19; #90; RF x 3  Notes to clinic: see note in this encounter.  Pt. Was given verbal recommendation to increase Zoloft to 2 tabs/ day, during video visit with Dr.  [name] in August.  Unable to find documentation supporting dose increase.  Please discuss with Dr. [name].  [place] and will run out of medication after today.  (got it filled about 2 weeks ago, and has run out, and will need to have new script sent with new dose recommendations)  Requested Prescriptions  Pending Prescriptions Disp Refills   sertraline (ZOLOFT) 50 MG tablet 90 tablet 3    Sig: TAKE 1 TABLET(50 MG) BY MOUTH DAILY      Psychiatry:  Antidepressants - SSRI Passed - 11/19/2019 12:39 PM      Passed - Completed PHQ-2 or PHQ-9 in the last 360 days.      Passed - Valid encounter within last 6 months    Recent Outpatient Visits           1 month ago COVID-19   [facility], [place], DO   9 months ago Depression with anxiety   [facility] [place], [place], DO   1 year ago Acute recurrent frontal sinusitis   [facility] [place], [name], DO   1 year ago Acute recurrent frontal sinusitis   [facility] [place], [place] T, NP   2 years ago Non-recurrent acute suppurative otitis media of right ear without spontaneous rupture of tympanic membrane   [facility] [place], [place], DO       Future Appointments             In 2 weeks [name], [name], DO [employer], [facility]

## 2019-11-19 NOTE — Telephone Encounter (Signed)
Medication Refill - Medication: Zoloft  Has the patient contacted their pharmacy? Yes.   (Agent: If no, request that the patient contact the pharmacy for the refill.) (Agent: If yes, when and what did the pharmacy advise?)  Preferred [facility] (with phone number [facility] [id number], [place], [place] [address] phone number is [phone]  Agent: Please be advised that RX refills may take up to 3 business days. We ask that you follow-up with your pharmacy.

## 2019-11-20 ENCOUNTER — Telehealth: Payer: Self-pay

## 2019-11-20 ENCOUNTER — Encounter: Payer: Self-pay | Admitting: Family Medicine

## 2019-11-20 NOTE — Telephone Encounter (Signed)
Called pt told her to call pharmacy and see why her medication can not be refilled until 10/20. Viewed chart it looks like it was time for pt to have a RF.  KP

## 2019-11-20 NOTE — Telephone Encounter (Signed)
Copied from CRM [id number]. Topic: General - Other >> Nov 19, 2019  3:38 PM Marylen Ponto wrote: Reason for CRM: Pt stated her pharmacy just called and left a message stating the Rx for sertraline (ZOLOFT) 50 MG tablet will not be filled until October 20. Pt requests that Dr. [name] nurse call her back

## 2019-11-25 ENCOUNTER — Encounter: Payer: Self-pay | Admitting: Family Medicine

## 2019-11-25 MED ORDER — SERTRALINE HCL 100 MG PO TABS: 100.0000 mg | ORAL_TABLET | Freq: Every day | ORAL | 1 refills | Status: DC

## 2019-12-09 ENCOUNTER — Ambulatory Visit (INDEPENDENT_AMBULATORY_CARE_PROVIDER_SITE_OTHER): Payer: Managed Care, Other (non HMO) | Admitting: Family Medicine

## 2019-12-09 ENCOUNTER — Encounter: Payer: Self-pay | Admitting: Family Medicine

## 2019-12-09 ENCOUNTER — Other Ambulatory Visit: Payer: Self-pay

## 2019-12-09 VITALS — BP 119/78 | HR 67 | Temp 98.8°F | Ht 62.0 in | Wt 145.0 lb

## 2019-12-09 DIAGNOSIS — F418 Other specified anxiety disorders: Secondary | ICD-10-CM

## 2019-12-09 DIAGNOSIS — Z0000 Encounter for general adult medical examination without abnormal findings: Secondary | ICD-10-CM | POA: Diagnosis not present

## 2019-12-09 DIAGNOSIS — F3341 Major depressive disorder, recurrent, in partial remission: Secondary | ICD-10-CM | POA: Insufficient documentation

## 2019-12-09 LAB — URINALYSIS, ROUTINE W REFLEX MICROSCOPIC
Bilirubin, UA: NEGATIVE
Glucose, UA: NEGATIVE
Ketones, UA: NEGATIVE
Leukocytes,UA: NEGATIVE
Nitrite, UA: NEGATIVE
Protein,UA: NEGATIVE
RBC, UA: NEGATIVE
Specific Gravity, UA: <1.005 — ABNORMAL LOW (ref 1.005–1.030)
Urobilinogen, Ur: 0.2 mg/dL (ref 0.2–1.0)
pH, UA: 6.5 (ref 5.0–7.5)

## 2019-12-09 NOTE — Progress Notes (Signed)
BP 119/78 (BP Location: Left Arm, Patient Position: Sitting)   Pulse 67   Temp 98.8 F (37.1 C) (Oral)   Ht 5\' 2"  (1.575 m)   Wt 145 lb (65.8 kg)   SpO2 99%   BMI 26.52 kg/m    Subjective:    Patient ID: , female    DOB: December 07, 1985, 34 y.o.   MRN: [id number]  HPI: Angela [name] is a 34 y.o. female presenting on 12/09/2019 for comprehensive medical examination. Current medical complaints include:   DEPRESSION Mood status: controlled Satisfied with current treatment?: no Symptom severity: severe  Duration of current treatment : months Side effects: no Medication compliance: excellent compliance Psychotherapy/counseling: no  Previous psychiatric medications: zoloft Depressed mood: no Anxious mood: no Anhedonia: no Significant weight loss or gain: no Insomnia: no  Fatigue: no Feelings of worthlessness or guilt: no Impaired concentration/indecisiveness: no Suicidal ideations: no Hopelessness: no Crying spells: no Depression screen [facility] 2/9 12/09/2019 10/14/2019 02/21/2019 06/28/2018 04/11/2017  Decreased Interest 0 0 0 0 0  Down, Depressed, Hopeless 0 1 0 0 1  PHQ - 2 Score 0 1 0 0 1  Altered sleeping 0 0 0 0 -  Tired, decreased energy 0 0 0 3 -  Change in appetite 0 0 0 0 -  Feeling bad or failure about yourself  0 1 0 0 -  Trouble concentrating 0 3 0 0 -  Moving slowly or fidgety/restless 0 1 0 0 -  Suicidal thoughts 0 0 0 0 -  PHQ-9 Score 0 6 0 3 -  Difficult doing work/chores Not difficult at all Not difficult at all Not difficult at all Not difficult at all -     She currently lives with: husband and son Menopausal Symptoms: no  Depression Screen done today and results listed below:  Depression screen [facility] 2/9 12/09/2019 10/14/2019 02/21/2019 06/28/2018 04/11/2017  Decreased Interest 0 0 0 0 0  Down, Depressed, Hopeless 0 1 0 0 1  PHQ - 2 Score 0 1 0 0 1  Altered sleeping 0 0 0 0 -  Tired, decreased energy 0 0 0 3 -  Change in appetite 0 0 0 0  -  Feeling bad or failure about yourself  0 1 0 0 -  Trouble concentrating 0 3 0 0 -  Moving slowly or fidgety/restless 0 1 0 0 -  Suicidal thoughts 0 0 0 0 -  PHQ-9 Score 0 6 0 3 -  Difficult doing work/chores Not difficult at all Not difficult at all Not difficult at all Not difficult at all -    Past Medical History:  Past Medical History:  Diagnosis Date  . Bilateral carpal tunnel syndrome 11/18/2015  . Cholestasis of pregnancy in third trimester 01/11/2017  . Depression with anxiety 05/07/2014  . GBS (group B streptococcus) infection 04/08/2019   PCN prophylaxis in labor  . GERD (gastroesophageal reflux disease)   . IBS (irritable bowel syndrome)   . Labor and delivery, indication for care 04/13/2019  . Low-lying placenta in third trimester-RESOLVED @34wk  01/29/2019   19 weeks: 0.9 cm 27 weeks: 1.3 cm 34 weeks-- RESOLVED Previa precautions reviewed. Follow up scans as per MFM.  . Migraines   . Missed abortion 03/26/2014  . Multiple thyroid nodules   . Painful menstrual periods 11/13/2014  . Raynaud disease   . Raynaud's disease     Surgical History:  Past Surgical History:  Procedure Laterality Date  . COLONOSCOPY  11/03/11  [place]:colonic mucosa appeared normal  . DG SELECTED HSG GDC ONLY    . WISDOM TOOTH EXTRACTION      Medications:  Current Outpatient Medications on File Prior to Visit  Medication Sig  . acetaminophen (TYLENOL) 325 MG tablet Take 2 tablets (650 mg total) by mouth every 6 (six) hours as needed (for pain scale < 4).  . norethindrone (MICRONOR) 0.35 MG tablet Take 1 tablet (0.35 mg total) by mouth daily.  . Prenatal Vit-Fe Fumarate-FA (PRENATAL MULTIVITAMIN) TABS tablet Take 1 tablet by mouth at bedtime.   . sertraline (ZOLOFT) 100 MG tablet Take 1 tablet (100 mg total) by mouth daily.   No current facility-administered medications on file prior to visit.    Allergies:  No Known Allergies  Social History:  Social History   Socioeconomic  History  . Marital status: Single    Spouse name: Not on file  . Number of children: 1  . Years of education: college  . Highest education level: Bachelor's degree (e.g., BA, AB, BS)  Occupational History  . Occupation: Child psychotherapist  Tobacco Use  . Smoking status: Former Smoker    Packs/day: 0.25    Years: 10.00    Pack years: 2.50    Types: Cigarettes    Quit date: 04/14/2016    Years since quitting: 3.6  . Smokeless tobacco: Never Used  . Tobacco comment: 5 cigs per day  Vaping Use  . Vaping Use: Never used  Substance and Sexual Activity  . Alcohol use: No    Comment: occ wine  . Drug use: No  . Sexual activity: Yes    Birth control/protection: None  Other Topics Concern  . Not on file  Social History Narrative  . Not on file   Social Determinants of Health   Financial Resource Strain:   . Difficulty of Paying Living Expenses: Not on file  Food Insecurity:   . Worried About Programme researcher, broadcasting/film/video in the Last Year: Not on file  . Ran Out of Food in the Last Year: Not on file  Transportation Needs:   . Lack of Transportation (Medical): Not on file  . Lack of Transportation (Non-Medical): Not on file  Physical Activity:   . Days of Exercise per Week: Not on file  . Minutes of Exercise per Session: Not on file  Stress:   . Feeling of Stress : Not on file  Social Connections:   . Frequency of Communication with Friends and Family: Not on file  . Frequency of Social Gatherings with Friends and Family: Not on file  . Attends Religious Services: Not on file  . Active Member of Clubs or Organizations: Not on file  . Attends Banker Meetings: Not on file  . Marital Status: Not on file  Intimate Partner Violence:   . Fear of Current or Ex-Partner: Not on file  . Emotionally Abused: Not on file  . Physically Abused: Not on file  . Sexually Abused: Not on file   Social History   Tobacco Use  Smoking Status Former Smoker  . Packs/day: 0.25  . Years: 10.00    . Pack years: 2.50  . Types: Cigarettes  . Quit date: 04/14/2016  . Years since quitting: 3.6  Smokeless Tobacco Never Used  Tobacco Comment   5 cigs per day   Social History   Substance and Sexual Activity  Alcohol Use No   Comment: occ wine    Family History:  Family History  Problem  Relation Age of Onset  . Diabetes Father   . Heart disease Father   . Alcohol abuse Father   . Hypertension Father   . Rheum arthritis Mother   . Arthritis Mother        rheumatoid  . Diabetes Paternal Grandfather   . Congestive Heart Failure Paternal Grandfather   . Alzheimer's disease Paternal Grandmother   . Other Maternal Grandmother        obstruction in stomach  . Stroke Maternal Grandfather   . Diabetes Maternal Grandfather   . Diabetes Sister   . Hypertension Sister   . Hypertension Brother   . Autism Cousin   . Autism Cousin   . Colon cancer Neg Hx   . Esophageal cancer Neg Hx   . Rectal cancer Neg Hx   . Stomach cancer Neg Hx     Past medical history, surgical history, medications, allergies, family history and social history reviewed with patient today and changes made to appropriate areas of the chart.   Review of Systems  Constitutional: Negative.   HENT: Negative.   Eyes: Negative.   Respiratory: Negative.   Cardiovascular: Negative.   Gastrointestinal: Negative.   Genitourinary: Negative.   Musculoskeletal: Negative.   Skin: Negative.   Neurological: Negative.   Endo/Heme/Allergies: Negative.   Psychiatric/Behavioral: Negative.     All other ROS negative except what is listed above and in the HPI.      Objective:    BP 119/78 (BP Location: Left Arm, Patient Position: Sitting)   Pulse 67   Temp 98.8 F (37.1 C) (Oral)   Ht 5\' 2"  (1.575 m)   Wt 145 lb (65.8 kg)   SpO2 99%   BMI 26.52 kg/m   Wt Readings from Last 3 Encounters:  12/09/19 145 lb (65.8 kg)  05/15/19 143 lb 9.6 oz (65.1 kg)  04/04/19 156 lb (70.8 kg)    Physical Exam Vitals and  nursing note reviewed.  Constitutional:      General: She is not in acute distress.    Appearance: Normal appearance. She is not ill-appearing, toxic-appearing or diaphoretic.  HENT:     Head: Normocephalic and atraumatic.     Right Ear: Tympanic membrane, ear canal and external ear normal. There is no impacted cerumen.     Left Ear: Tympanic membrane, ear canal and external ear normal. There is no impacted cerumen.     Nose: Nose normal. No congestion or rhinorrhea.     Mouth/Throat:     Mouth: Mucous membranes are moist.     Pharynx: Oropharynx is clear. No oropharyngeal exudate or posterior oropharyngeal erythema.  Eyes:     General: No scleral icterus.       Right eye: No discharge.        Left eye: No discharge.     Extraocular Movements: Extraocular movements intact.     Conjunctiva/sclera: Conjunctivae normal.     Pupils: Pupils are equal, round, and reactive to light.  Neck:     Vascular: No carotid bruit.  Cardiovascular:     Rate and Rhythm: Normal rate and regular rhythm.     Pulses: Normal pulses.     Heart sounds: No murmur heard.  No friction rub. No gallop.   Pulmonary:     Effort: Pulmonary effort is normal. No respiratory distress.     Breath sounds: Normal breath sounds. No stridor. No wheezing, rhonchi or rales.  Chest:     Chest wall: No tenderness.  Abdominal:  General: Abdomen is flat. Bowel sounds are normal. There is no distension.     Palpations: Abdomen is soft. There is no mass.     Tenderness: There is no abdominal tenderness. There is no right CVA tenderness, left CVA tenderness, guarding or rebound.     Hernia: No hernia is present.  Genitourinary:    Comments: Breast and pelvic exams deferred with shared decision making Musculoskeletal:        General: No swelling, tenderness, deformity or signs of injury.     Cervical back: Normal range of motion and neck supple. No rigidity. No muscular tenderness.     Right lower leg: No edema.     Left  lower leg: No edema.  Lymphadenopathy:     Cervical: No cervical adenopathy.  Skin:    General: Skin is warm and dry.     Capillary Refill: Capillary refill takes less than 2 seconds.     Coloration: Skin is not jaundiced or pale.     Findings: No bruising, erythema, lesion or rash.  Neurological:     General: No focal deficit present.     Mental Status: She is alert and oriented to person, place, and time. Mental status is at baseline.     Cranial Nerves: No cranial nerve deficit.     Sensory: No sensory deficit.     Motor: No weakness.     Coordination: Coordination normal.     Gait: Gait normal.     Deep Tendon Reflexes: Reflexes normal.  Psychiatric:        Mood and Affect: Mood normal.        Behavior: Behavior normal.        Thought Content: Thought content normal.        Judgment: Judgment normal.     Results for orders placed or performed in visit on 05/15/19  Beta hCG quant (ref lab)  Result Value Ref Range   hCG Quant <1 mIU/mL  CBC  Result Value Ref Range   WBC 2.9 (L) 3.4 - 10.8 x10E3/uL   RBC 5.00 3.77 - 5.28 x10E6/uL   Hemoglobin 13.1 11.1 - 15.9 g/dL   Hematocrit 16.140.8 09.634.0 - 46.6 %   MCV 82 79 - 97 fL   MCH 26.2 (L) 26.6 - 33.0 pg   MCHC 32.1 31 - 35 g/dL   RDW 04.513.3 40.911.7 - 81.115.4 %   Platelets 163 150 - 450 x10E3/uL      Assessment & Plan:   Problem List Items Addressed This Visit      Other   Depression with anxiety    Under good control on current regimen. Continue current regimen. Continue to monitor. Call with any concerns. Refills up to date.        Recurrent major depressive disorder, in partial remission (HCC)    Under good control on current regimen. Continue current regimen. Continue to monitor. Call with any concerns. Refills up to date.         Other Visit Diagnoses    Routine general medical examination at a health care facility    -  Primary   Vaccines declined. Screening labs checked today. Pap up to date. Continue diet and  exercise. Call with any concerns.    Relevant Orders   CBC with Differential/Platelet   Comprehensive metabolic panel   Lipid Panel w/o Chol/HDL Ratio   TSH   Urinalysis, Routine w reflex microscopic   Hepatitis C Antibody       Follow  up plan: Return in about 6 months (around 06/08/2020).   LABORATORY TESTING:  - Pap smear: up to date  IMMUNIZATIONS:   - Tdap: Tetanus vaccination status reviewed: last tetanus booster within 10 years. - Influenza: Refused - Pneumovax: Not applicable - Prevnar: Not applicable - COVID: Refused  PATIENT COUNSELING:   Advised to take 1 mg of folate supplement per day if capable of pregnancy.   Sexuality: Discussed sexually transmitted diseases, partner selection, use of condoms, avoidance of unintended pregnancy  and contraceptive alternatives.   Advised to avoid cigarette smoking.  I discussed with the patient that most people either abstain from alcohol or drink within safe limits (<=14/week and <=4 drinks/occasion for males, <=7/weeks and <= 3 drinks/occasion for females) and that the risk for alcohol disorders and other health effects rises proportionally with the number of drinks per week and how often a drinker exceeds daily limits.  Discussed cessation/primary prevention of drug use and availability of treatment for abuse.   Diet: Encouraged to adjust caloric intake to maintain  or achieve ideal body weight, to reduce intake of dietary saturated fat and total fat, to limit sodium intake by avoiding high sodium foods and not adding table salt, and to maintain adequate dietary potassium and calcium preferably from fresh fruits, vegetables, and low-fat dairy products.    stressed the importance of regular exercise  Injury prevention: Discussed safety belts, safety helmets, smoke detector, smoking near bedding or upholstery.   Dental health: Discussed importance of regular tooth brushing, flossing, and dental visits.    NEXT PREVENTATIVE  PHYSICAL DUE IN 1 YEAR. Return in about 6 months (around 06/08/2020).

## 2019-12-09 NOTE — Assessment & Plan Note (Signed)
Under good control on current regimen. Continue current regimen. Continue to monitor. Call with any concerns. Refills up to date.   

## 2019-12-09 NOTE — Patient Instructions (Addendum)
Health Maintenance, Female Adopting a healthy lifestyle and getting preventive care are important in promoting health and wellness. Ask your health care provider about:  The right schedule for you to have regular tests and exams.  Things you can do on your own to prevent diseases and keep yourself healthy. What should I know about diet, weight, and exercise? Eat a healthy diet   Eat a diet that includes plenty of vegetables, fruits, low-fat dairy products, and lean protein.  Do not eat a lot of foods that are high in solid fats, added sugars, or sodium. Maintain a healthy weight Body mass index (BMI) is used to identify weight problems. It estimates body fat based on height and weight. Your health care provider can help determine your BMI and help you achieve or maintain a healthy weight. Get regular exercise Get regular exercise. This is one of the most important things you can do for your health. Most adults should:  Exercise for at least 150 minutes each week. The exercise should increase your heart rate and make you sweat (moderate-intensity exercise).  Do strengthening exercises at least twice a week. This is in addition to the moderate-intensity exercise.  Spend less time sitting. Even light physical activity can be beneficial. Watch cholesterol and blood lipids Have your blood tested for lipids and cholesterol at 33 years of age, then have this test every 5 years. Have your cholesterol levels checked more often if:  Your lipid or cholesterol levels are high.  You are older than 33 years of age.  You are at high risk for heart disease. What should I know about cancer screening? Depending on your health history and family history, you may need to have cancer screening at various ages. This may include screening for:  Breast cancer.  Cervical cancer.  Colorectal cancer.  Skin cancer.  Lung cancer. What should I know about heart disease, diabetes, and high blood  pressure? Blood pressure and heart disease  High blood pressure causes heart disease and increases the risk of stroke. This is more likely to develop in people who have high blood pressure readings, are of African descent, or are overweight.  Have your blood pressure checked: ? Every 3-5 years if you are 18-39 years of age. ? Every year if you are 40 years old or older. Diabetes Have regular diabetes screenings. This checks your fasting blood sugar level. Have the screening done:  Once every three years after age 40 if you are at a normal weight and have a low risk for diabetes.  More often and at a younger age if you are overweight or have a high risk for diabetes. What should I know about preventing infection? Hepatitis B If you have a higher risk for hepatitis B, you should be screened for this virus. Talk with your health care provider to find out if you are at risk for hepatitis B infection. Hepatitis C Testing is recommended for:  Everyone born from 1945 through 1965.  Anyone with known risk factors for hepatitis C. Sexually transmitted infections (STIs)  Get screened for STIs, including gonorrhea and chlamydia, if: ? You are sexually active and are younger than 33 years of age. ? You are older than 33 years of age and your health care provider tells you that you are at risk for this type of infection. ? Your sexual activity has changed since you were last screened, and you are at increased risk for chlamydia or gonorrhea. Ask your health care provider if   you are at risk.  Ask your health care provider about whether you are at high risk for HIV. Your health care provider may recommend a prescription medicine to help prevent HIV infection. If you choose to take medicine to prevent HIV, you should first get tested for HIV. You should then be tested every 3 months for as long as you are taking the medicine. Pregnancy  If you are about to stop having your period (premenopausal) and  you may become pregnant, seek counseling before you get pregnant.  Take 400 to 800 micrograms (mcg) of folic acid every day if you become pregnant.  Ask for birth control (contraception) if you want to prevent pregnancy. Osteoporosis and menopause Osteoporosis is a disease in which the bones lose minerals and strength with aging. This can result in bone fractures. If you are 65 years old or older, or if you are at risk for osteoporosis and fractures, ask your health care provider if you should:  Be screened for bone loss.  Take a calcium or vitamin D supplement to lower your risk of fractures.  Be given hormone replacement therapy (HRT) to treat symptoms of menopause. Follow these instructions at home: Lifestyle  Do not use any products that contain nicotine or tobacco, such as cigarettes, e-cigarettes, and chewing tobacco. If you need help quitting, ask your health care provider.  Do not use street drugs.  Do not share needles.  Ask your health care provider for help if you need support or information about quitting drugs. Alcohol use  Do not drink alcohol if: ? Your health care provider tells you not to drink. ? You are pregnant, may be pregnant, or are planning to become pregnant.  If you drink alcohol: ? Limit how much you use to 0-1 drink a day. ? Limit intake if you are breastfeeding.  Be aware of how much alcohol is in your drink. In the [place] one drink equals one 12 oz bottle of beer (355 mL), one 5 oz glass of wine (148 mL), or one 1 oz glass of hard liquor (44 mL). General instructions  Schedule regular health, dental, and eye exams.  Stay current with your vaccines.  Tell your health care provider if: ? You often feel depressed. ? You have ever been abused or do not feel safe at home. Summary  Adopting a healthy lifestyle and getting preventive care are important in promoting health and wellness.  Follow your health care provider's instructions about healthy  diet, exercising, and getting tested or screened for diseases.  Follow your health care provider's instructions on monitoring your cholesterol and blood pressure. This information is not intended to replace advice given to you by your health care provider. Make sure you discuss any questions you have with your health care provider. Document Revised: 01/24/2018 Document Reviewed: 01/24/2018 [employer]vier Patient Education  2020 [employer]  

## 2019-12-10 LAB — CBC WITH DIFFERENTIAL/PLATELET
Basophils Absolute: 0 10*3/uL (ref 0.0–0.2)
Basos: 1 %
EOS (ABSOLUTE): 0 10*3/uL (ref 0.0–0.4)
Eos: 1 %
Hematocrit: 38.1 % (ref 34.0–46.6)
Hemoglobin: 11.9 g/dL (ref 11.1–15.9)
Immature Grans (Abs): 0 10*3/uL (ref 0.0–0.1)
Immature Granulocytes: 0 %
Lymphocytes Absolute: 1.3 10*3/uL (ref 0.7–3.1)
Lymphs: 30 %
MCH: 25.9 pg — ABNORMAL LOW (ref 26.6–33.0)
MCHC: 31.2 g/dL — ABNORMAL LOW (ref 31.5–35.7)
MCV: 83 fL (ref 79–97)
Monocytes Absolute: 0.3 10*3/uL (ref 0.1–0.9)
Monocytes: 6 %
Neutrophils Absolute: 2.7 10*3/uL (ref 1.4–7.0)
Neutrophils: 62 %
Platelets: 209 10*3/uL (ref 150–450)
RBC: 4.6 x10E6/uL (ref 3.77–5.28)
RDW: 13.6 % (ref 11.7–15.4)
WBC: 4.3 10*3/uL (ref 3.4–10.8)

## 2019-12-10 LAB — COMPREHENSIVE METABOLIC PANEL
ALT: 10 IU/L (ref 0–32)
AST: 41 IU/L — ABNORMAL HIGH (ref 0–40)
Albumin/Globulin Ratio: 2 (ref 1.2–2.2)
Albumin: 4.9 g/dL — ABNORMAL HIGH (ref 3.8–4.8)
Alkaline Phosphatase: 62 IU/L (ref 44–121)
BUN/Creatinine Ratio: 18 (ref 9–23)
BUN: 11 mg/dL (ref 6–20)
Bilirubin Total: 0.2 mg/dL (ref 0.0–1.2)
CO2: 23 mmol/L (ref 20–29)
Calcium: 9.1 mg/dL (ref 8.7–10.2)
Chloride: 103 mmol/L (ref 96–106)
Creatinine, Ser: 0.62 mg/dL (ref 0.57–1.00)
GFR calc Af Amer: 138 mL/min/{1.73_m2} (ref >59)
GFR calc non Af Amer: 120 mL/min/{1.73_m2} (ref >59)
Globulin, Total: 2.5 g/dL (ref 1.5–4.5)
Glucose: 122 mg/dL — ABNORMAL HIGH (ref 65–99)
Potassium: 4.5 mmol/L (ref 3.5–5.2)
Sodium: 142 mmol/L (ref 134–144)
Total Protein: 7.4 g/dL (ref 6.0–8.5)

## 2019-12-10 LAB — LIPID PANEL W/O CHOL/HDL RATIO
Cholesterol, Total: 173 mg/dL (ref 100–199)
HDL: 43 mg/dL (ref >39)
LDL Chol Calc (NIH): 108 mg/dL — ABNORMAL HIGH (ref 0–99)
Triglycerides: 120 mg/dL (ref 0–149)
VLDL Cholesterol Cal: 22 mg/dL (ref 5–40)

## 2019-12-10 LAB — HEPATITIS C ANTIBODY: Hep C Virus Ab: <0.1 s/co ratio (ref 0.0–0.9)

## 2019-12-10 LAB — TSH: TSH: 2.61 u[IU]/mL (ref 0.450–4.500)

## 2020-02-23 IMAGING — US US MFM OB FOLLOW-UP
1 series · 13 of 28 positions shown · non-contrast
Comparison: none

[Series 1: us mfm ob follow-up · 41 acquisitions, 13 frames shown]
[im 2/41]
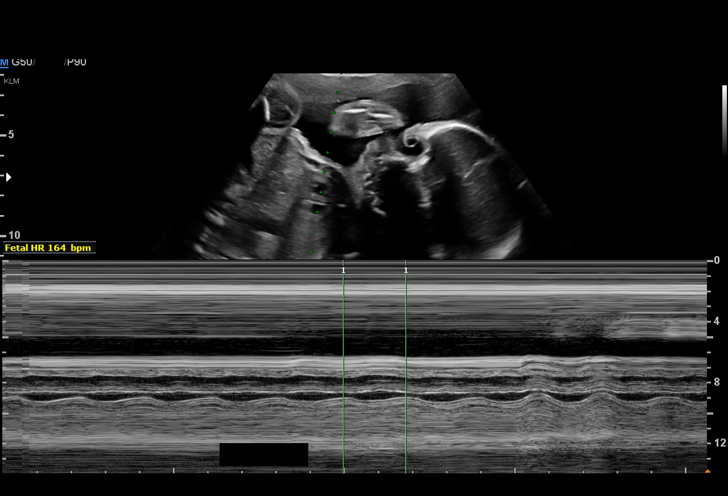
[im 5/41]
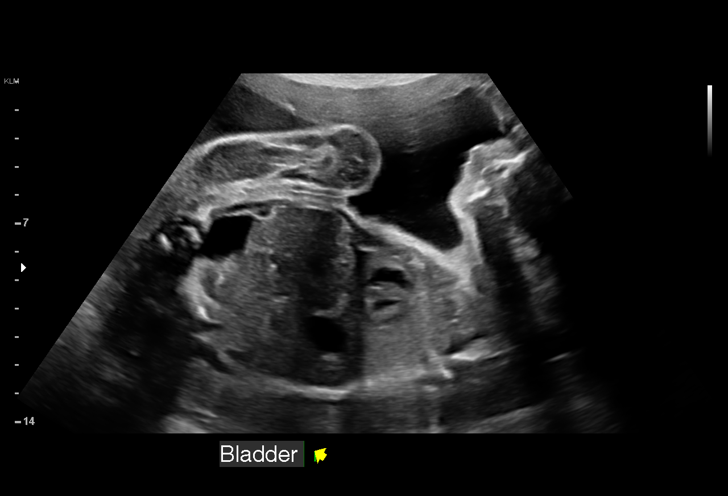
[im 8/41]
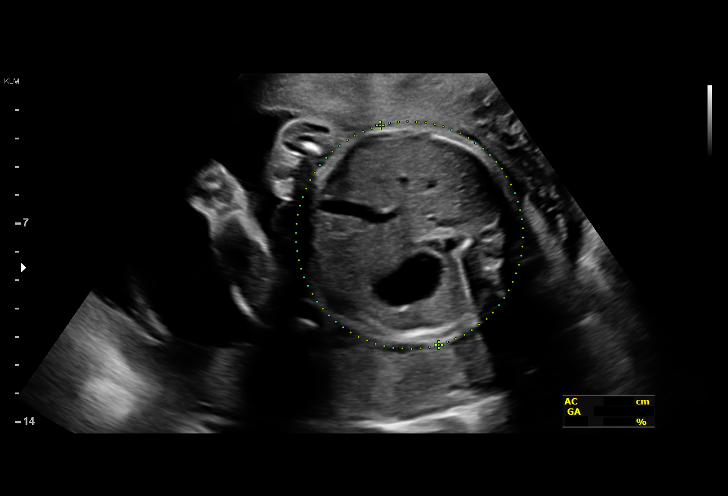
[im 11/41]
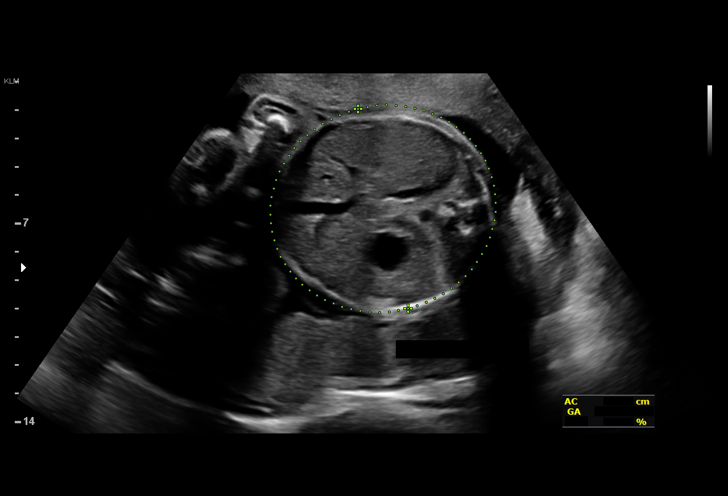
[im 14/41]
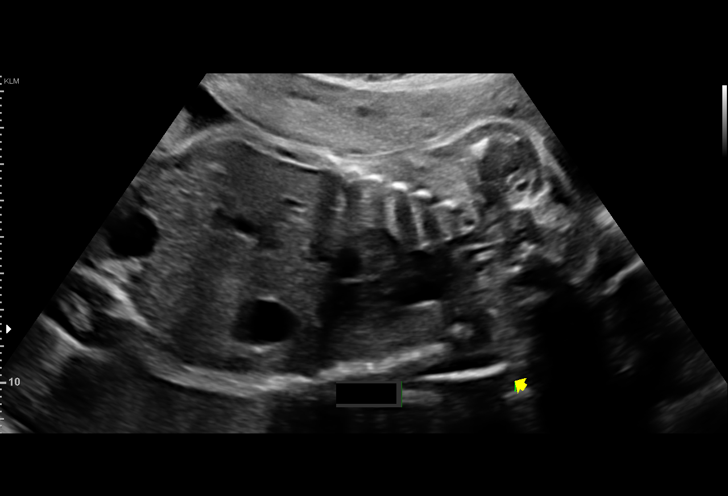
[im 17/41]
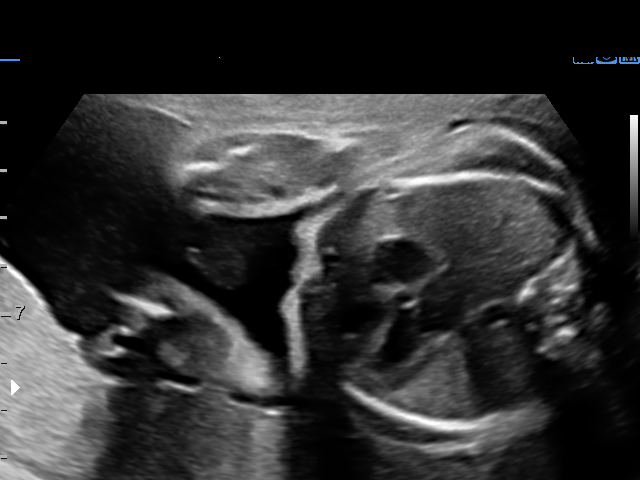
[im 21/41]
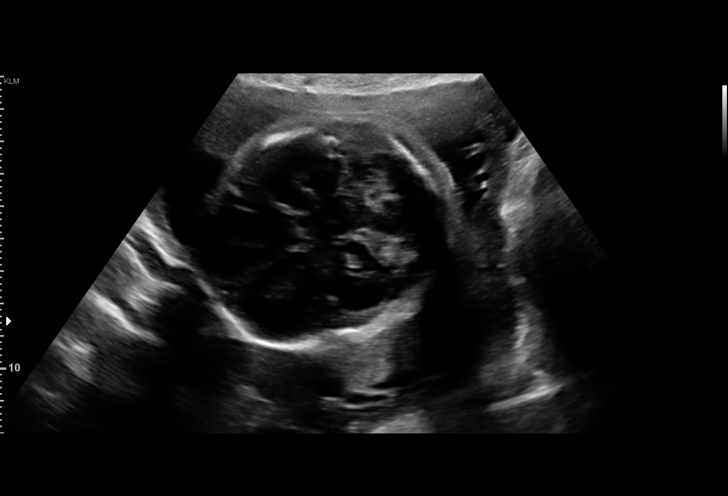
[im 24/41]
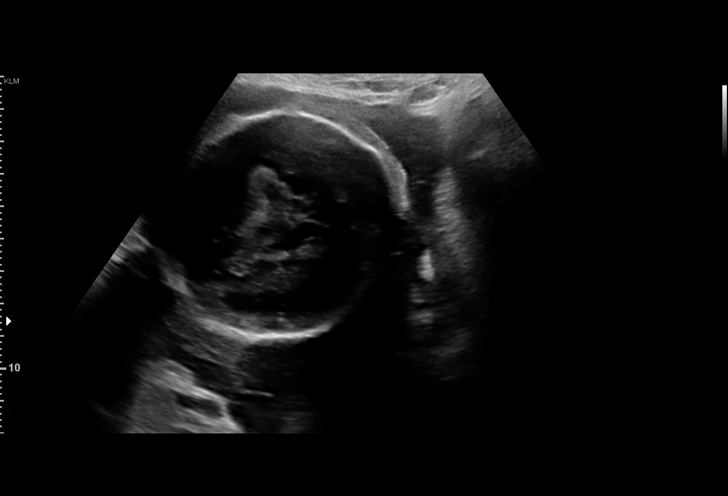
[im 27/41]
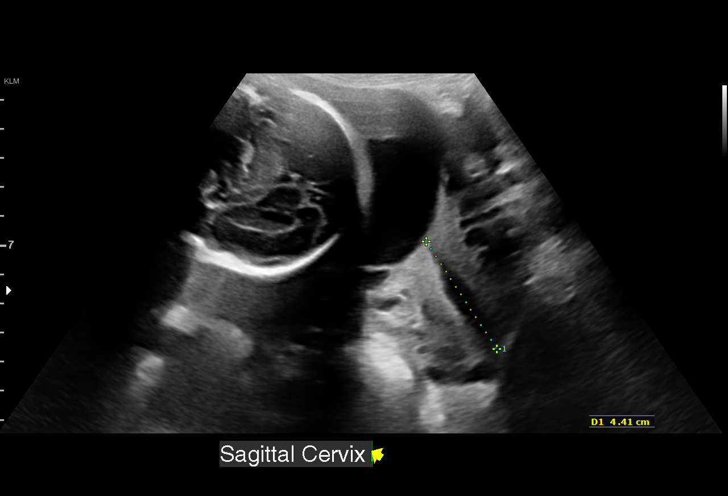
[im 30/41]
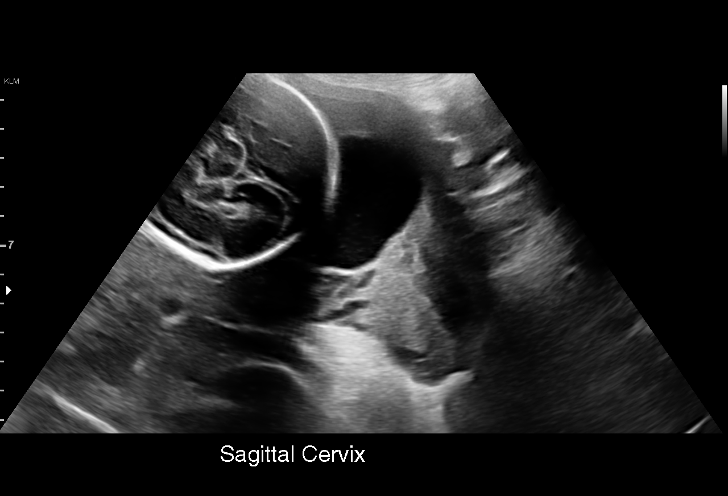
[im 33/41]
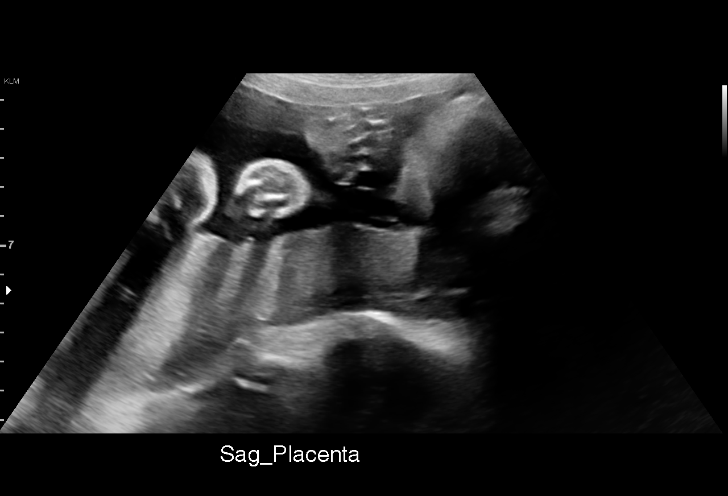
[im 36/41]
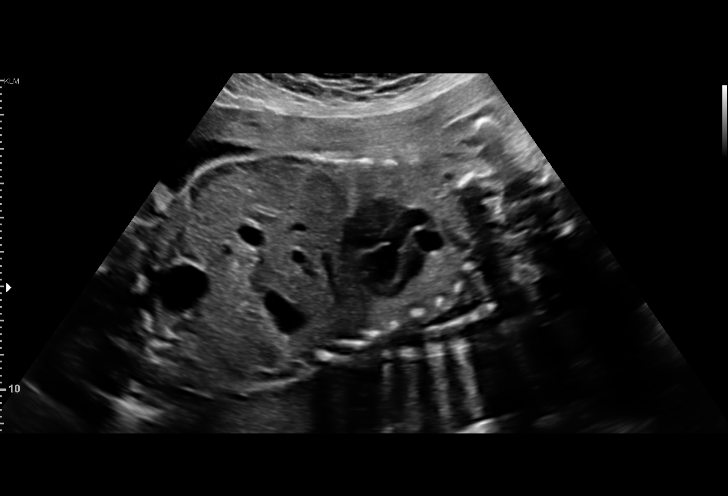
[im 39/41]
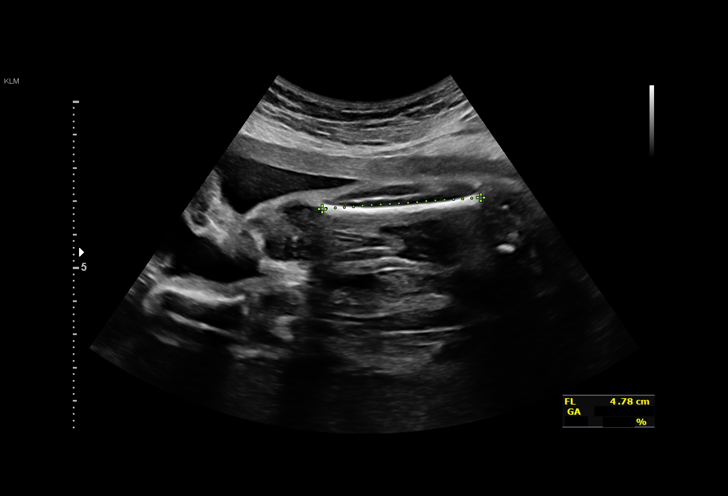

[13 of 28 positions shown; findings below may reference images not displayed]

Name:       JUXTCALME TIGER                Visit Date: 01/25/2019 [REDACTED]

 ----------------------------------------------------------------------

 ----------------------------------------------------------------------
Indications

  Low lying placenta, antepartum
  Poor obstetrical history (cholestasis in
  pregnancy)
  Fetal abnormality - other known or
  suspected (right CPC)
  27 weeks gestation of pregnancy
 ----------------------------------------------------------------------
Fetal Evaluation

 Num Of Fetuses:         1
 Fetal Heart Rate(bpm):  164
 Cardiac Activity:       Observed
 Presentation:           Cephalic
 Placenta:               Posterior, low-lying, 1.3cm from int os
 P. Cord Insertion:      Previously Visualized

 Amniotic Fluid
 AFI FV:      Within normal limits

                             Largest Pocket(cm)

Biometry

 BPD:      68.2  mm     G. Age:  27w 3d         54  %    CI:        74.76   %    70 - 86
                                                         FL/HC:      19.3   %    18.6 -
 HC:      250.3  mm     G. Age:  27w 1d         28  %    HC/AC:      1.02        1.05 -
 AC:      245.3  mm     G. Age:  28w 5d         89  %    FL/BPD:     71.0   %    71 - 87
 FL:       48.4  mm     G. Age:  26w 2d         16  %    FL/AC:      19.7   %    20 - 24

 Est. FW:    3333  gm      2 lb 7 oz     66  %
OB History
 Gravidity:    3         Term:   1        Prem:   0        SAB:   1
 TOP:          0       Ectopic:  0        Living: 1
Gestational Age

 LMP:           27w 0d        Date:  [DATE]                 EDD:   07/20/18
 U/S Today:     27w 3d                                        EDD:   04/26/19
 Best:          27w 0d     Det. By:  LMP  (04/23/19)          EDD:   07/20/18
Anatomy

 Cranium:               Appears normal         LVOT:                   Appears normal
 Cavum:                 Appears normal         Aortic Arch:            Appears normal
 Ventricles:            Appears normal         Ductal Arch:            Appears normal
 Choroid Plexus:        Resolved Rt CP cyst    Diaphragm:              Appears normal
 Cerebellum:            Appears normal         Stomach:                Appears normal, left
                                                                       sided
 Posterior Fossa:       Appears normal         Abdomen:                Appears normal
 Nuchal Fold:           Previously seen        Abdominal Wall:         Previously seen
 Face:                  Orbits and profile     Cord Vessels:           Previously seen
                        previously seen
 Lips:                  Previously seen        Kidneys:                Appear normal
 Palate:                Previously seen        Bladder:                Appears normal
 Thoracic:              Appears normal         Spine:                  Previously seen
 Heart:                 Appears normal         Upper Extremities:      Previously seen
                        (4CH, axis, and
                        situs)
 RVOT:                  Appears normal         Lower Extremities:      Previously seen

 Other:  Heels/feet and open hands/5th digits previously  visualized. Nasal
         bone previously visualized.
Cervix Uterus Adnexa

 Cervix
 Length:            4.4  cm.
 Normal appearance by transabdominal scan.
Comments

 This patient was seen for a follow up growth scan due to a
 low-lying placenta noted during her prior ultrasound exams.
 She denies any problems since her last exam and denies any
 vaginal bleeding.
 She was informed that the fetal growth and amniotic fluid
 level appears appropriate for her gestational age.
 A low-lying placenta measuring 1.3 cm away from the internal
 cervical os continues to be noted today.  Precautions
 associated with the placenta previa were discussed.
 A follow-up exam was scheduled in 6 weeks to assess the
 placental location.  The patient was advised that I anticipate
 that the placenta previa will most likely have resolved by her
 next exam.

## 2020-03-03 ENCOUNTER — Encounter: Payer: Self-pay | Admitting: Radiology

## 2020-04-12 IMAGING — US US MFM OB FOLLOW-UP
1 series · 13 of 28 positions shown · non-contrast
Comparison: none

[Series 1: us mfm ob follow-up · 56 acquisitions, 13 frames shown]
[im 3/56]
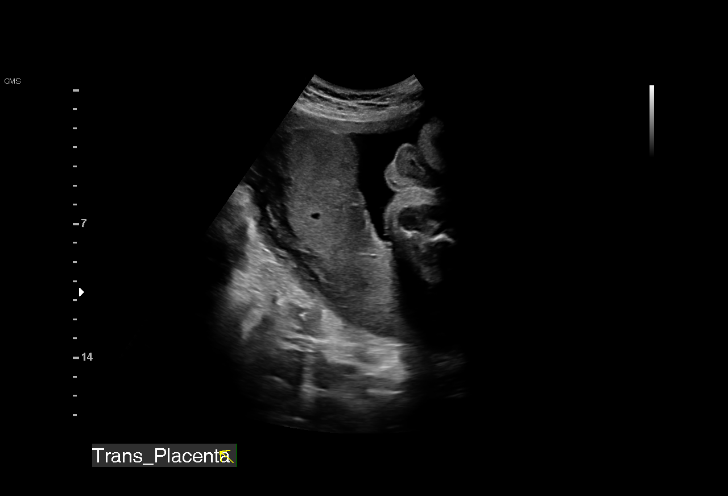
[im 7/56]
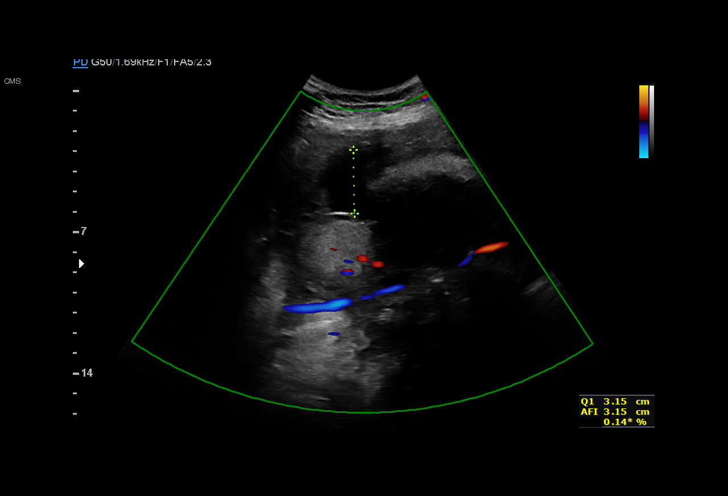
[im 11/56]
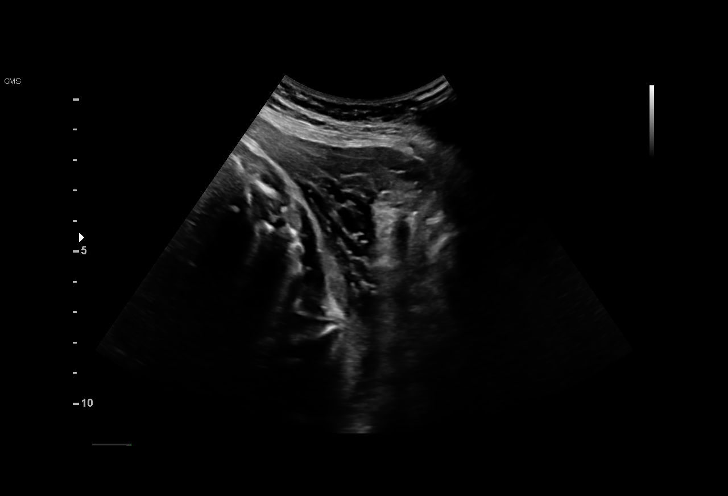
[im 15/56]
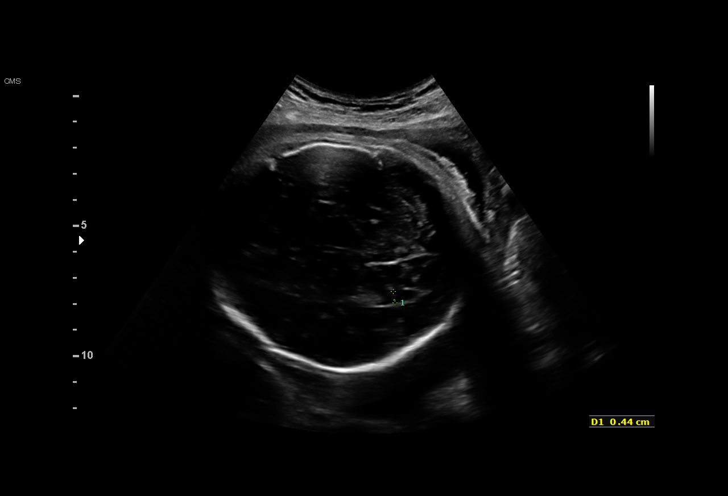
[im 19/56]
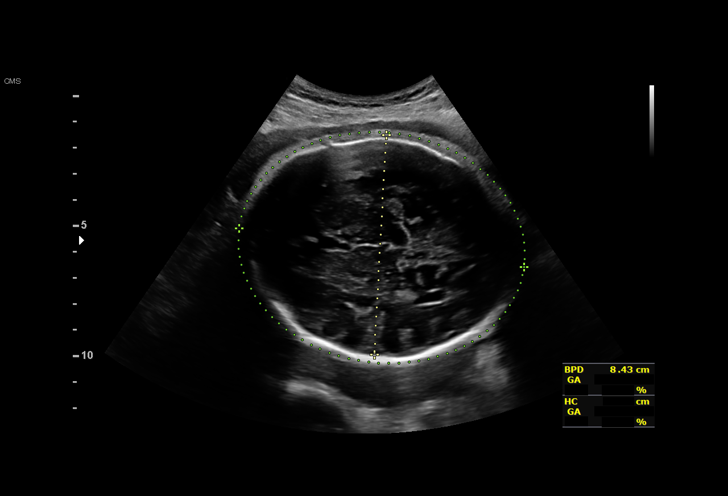
[im 23/56]
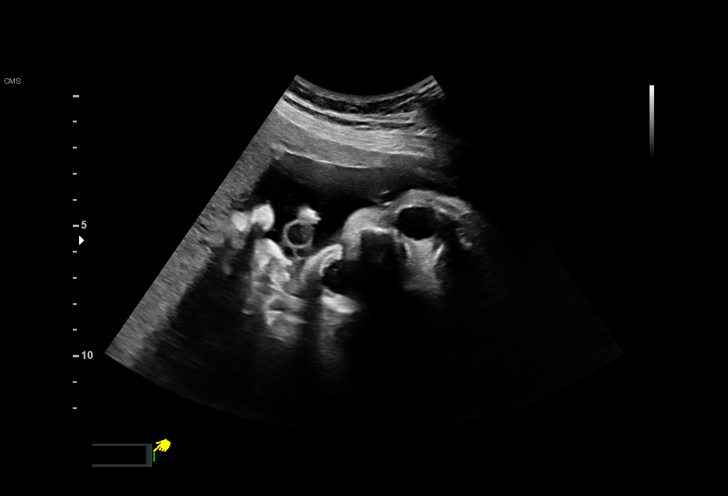
[im 29/56]
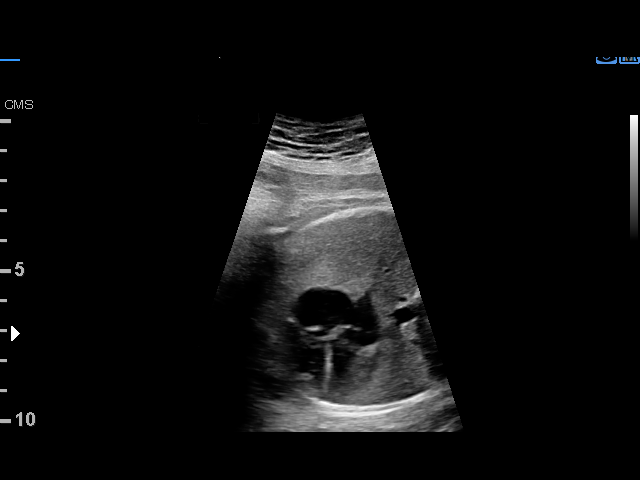
[im 33/56]
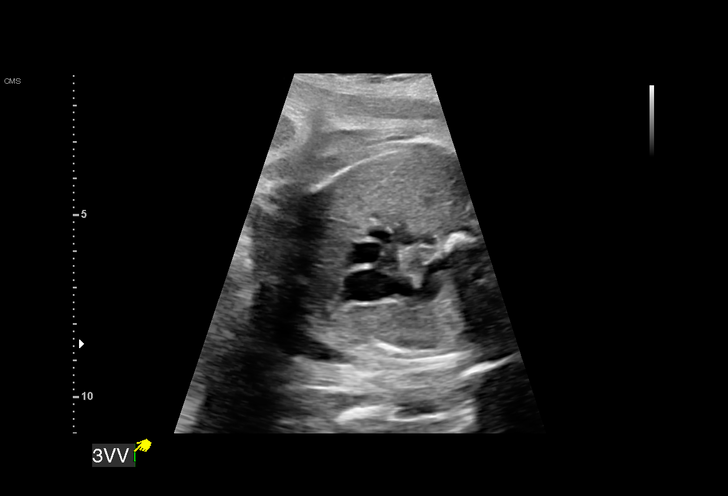
[im 37/56]
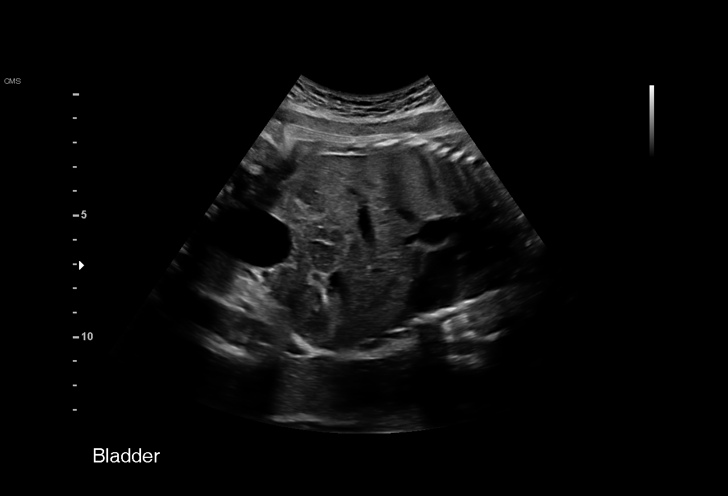
[im 41/56]
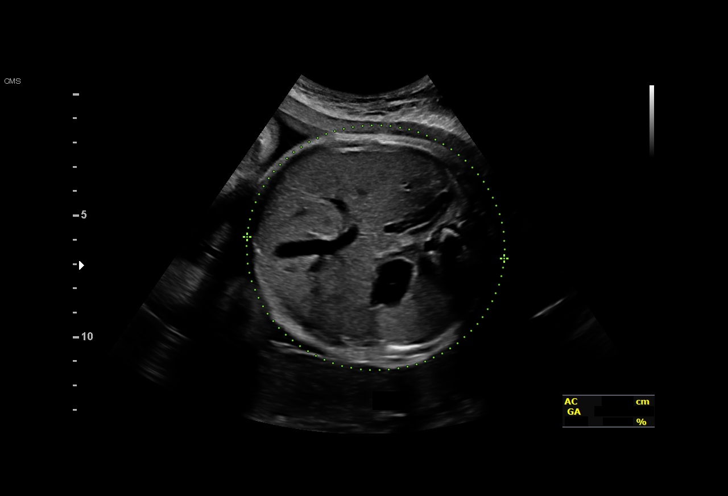
[im 45/56]
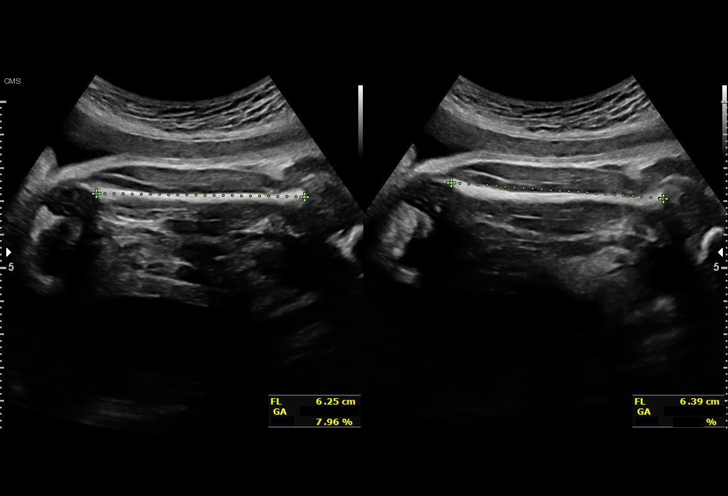
[im 49/56]
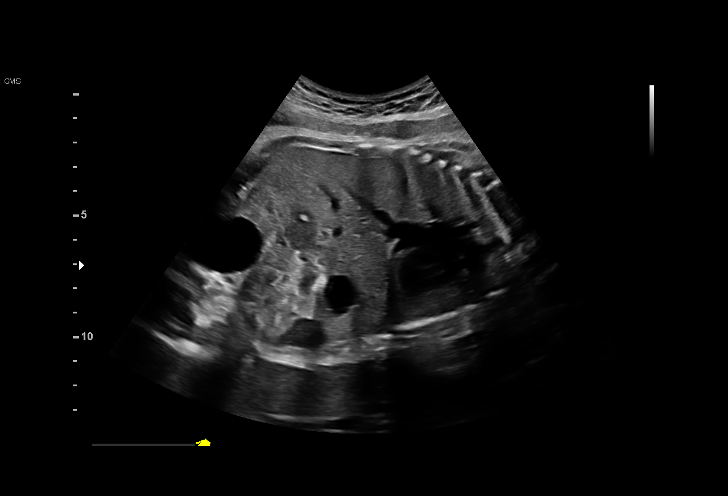
[im 53/56]
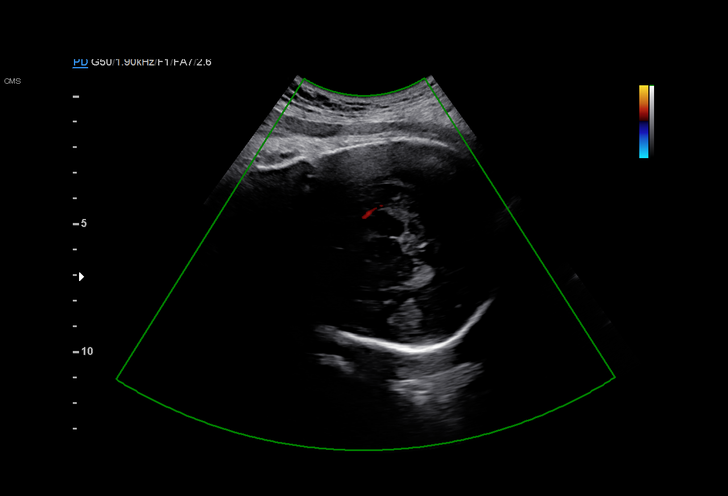

[13 of 28 positions shown; findings below may reference images not displayed]

Name:       FELICITY WINSTON                Visit Date: 03/15/2019 [REDACTED]

 ----------------------------------------------------------------------

 ----------------------------------------------------------------------
Indications

  Encounter for other antenatal screening
  follow-up
  Low lying placenta, antepartum
  Poor obstetrical history (cholestasis in
  pregnancy)
  Fetal abnormality - other known or
  suspected (right CPC)
  34 weeks gestation of pregnancy
 ----------------------------------------------------------------------
Fetal Evaluation

 Num Of Fetuses:         1
 Fetal Heart Rate(bpm):  165
 Cardiac Activity:       Observed
 Presentation:           Cephalic
 Placenta:               Posterior
 P. Cord Insertion:      Previously Visualized

 Amniotic Fluid
 AFI FV:      Within normal limits

 AFI Sum(cm)     %Tile       Largest Pocket(cm)
 14.35           51

 RUQ(cm)       RLQ(cm)       LUQ(cm)        LLQ(cm)

Biometry

 BPD:      84.4  mm     G. Age:  34w 0d         47  %    CI:        73.29   %    70 - 86
                                                         FL/HC:      20.1   %    19.4 -
 HC:      313.3  mm     G. Age:  35w 1d         41  %    HC/AC:      0.98        0.96 -
 AC:      319.4  mm     G. Age:  35w 6d         94  %    FL/BPD:     74.8   %    71 - 87
 FL:       63.1  mm     G. Age:  32w 4d         11  %    FL/AC:      19.8   %    20 - 24
 HUM:      56.6  mm     G. Age:  32w 6d         34  %
 Est. FW:    2151  gm      5 lb 8 oz     66  %
OB History

 Gravidity:    3         Term:   1        Prem:   0        SAB:   1
 TOP:          0       Ectopic:  0        Living: 1
Gestational Age

 LMP:           34w 0d        Date:  [DATE]                 EDD:   07/20/18
 U/S Today:     34w 3d                                        EDD:   04/26/19
 Best:          34w 0d     Det. By:  LMP  (04/23/19)          EDD:   07/20/18
Anatomy

 Cranium:               Appears normal         LVOT:                   Appears normal
 Cavum:                 Appears normal         Aortic Arch:            Previously seen
 Ventricles:            Appears normal         Ductal Arch:            Previously seen
 Choroid Plexus:        Resolved Rt CP cyst    Diaphragm:              Appears normal
 Cerebellum:            Appears normal         Stomach:                Appears normal, left
                                                                       sided
 Posterior Fossa:       Previously seen        Abdomen:                Appears normal
 Nuchal Fold:           Previously seen        Abdominal Wall:         Previously seen
 Face:                  Appears normal         Cord Vessels:           Previously seen
                        (orbits and profile)
 Lips:                  Appears normal         Kidneys:                Appear normal
 Palate:                Previously seen        Bladder:                Appears normal
 Thoracic:              Appears normal         Spine:                  Previously seen
 Heart:                 Appears normal         Upper Extremities:      Previously seen
                        (4CH, axis, and
                        situs)
 RVOT:                  Appears normal         Lower Extremities:      Previously seen

 Other:  Heels/feet and open hands/5th digits previously visualized. Nasal
         bone visualized.
Cervix Uterus Adnexa

 Cul De Sac
 No free fluid seen.

 Adnexa
 No abnormality visualized.
Impression

 Patient returns for fetal growth assessment and evaluation of
 placenta.

 Amniotic fluid is normal and good fetal activity is seen. Fetal
 growth is appropriate for gestational age.  Placenta is
 posterior and there is no evidence of low-lying placenta.  We
 reassured the patient of the findings.
Recommendations

 -Follow-up scans as clinically indicated.
                 [place], [place]

## 2020-05-06 ENCOUNTER — Other Ambulatory Visit: Payer: Self-pay | Admitting: Family Medicine

## 2020-05-06 NOTE — Telephone Encounter (Signed)
Copied from CRM [id number]. Topic: Quick Communication - Rx Refill/Question >> May 06, 2020  4:22 PM Gaetana Michaelis A wrote: Medication: norethindrone (MICRONOR) 0.35 MG tablet   Has the patient contacted their pharmacy? Yes. Patient contacted pharmacy and was instructed by staff at the pharmacy to contact their PCP  Preferred Pharmacy (with phone number or street name): [id number] - [place], [place] - [address] AT [facility] [address]  Phone:  [phone]  Agent: Please be advised that RX refills may take up to 3 business days. We ask that you follow-up with your pharmacy.

## 2020-05-06 NOTE — Telephone Encounter (Signed)
Requested medication (s) are due for refill today: Yes  Requested medication (s) are on the active medication list: Yes  Last refill:  04/15/19  Future visit scheduled: Yes  Notes to clinic:  Unable to refill per protocol, last refill by another provider, expired Rx      Requested Prescriptions  Pending Prescriptions Disp Refills   norethindrone (MICRONOR) 0.35 MG tablet      Sig: Take 1 tablet (0.35 mg total) by mouth daily.      OB/GYN: Contraceptives - Progestins Passed - 05/06/2020  4:30 PM      Passed - Valid encounter within last 12 months    Recent Outpatient Visits           4 months ago Routine general medical examination at a health care facility   [facility], [name], DO   6 months ago COVID-19   [facility] [place], [place], DO   1 year ago Depression with anxiety   [facility] [place], [place], DO   1 year ago Acute recurrent frontal sinusitis   [facility] [place], [place], DO   2 years ago Acute recurrent frontal sinusitis   [facility] Marjie Skiff, NP       Future Appointments             In 1 month Johnson, [name], DO [facility], [facility]

## 2020-05-06 NOTE — Telephone Encounter (Signed)
Routing to provider  

## 2020-05-07 ENCOUNTER — Ambulatory Visit: Payer: Managed Care, Other (non HMO) | Admitting: Family Medicine

## 2020-05-08 MED ORDER — NORETHINDRONE 0.35 MG PO TABS: 1.0000 | ORAL_TABLET | Freq: Every day | ORAL | 0 refills | Status: DC

## 2020-06-08 ENCOUNTER — Other Ambulatory Visit: Payer: Self-pay

## 2020-06-08 ENCOUNTER — Ambulatory Visit (INDEPENDENT_AMBULATORY_CARE_PROVIDER_SITE_OTHER): Payer: Managed Care, Other (non HMO) | Admitting: Family Medicine

## 2020-06-08 ENCOUNTER — Encounter: Payer: Self-pay | Admitting: Family Medicine

## 2020-06-08 VITALS — BP 104/64 | HR 69 | Temp 98.6°F | Wt 123.2 lb

## 2020-06-08 DIAGNOSIS — F3341 Major depressive disorder, recurrent, in partial remission: Secondary | ICD-10-CM

## 2020-06-08 DIAGNOSIS — R634 Abnormal weight loss: Secondary | ICD-10-CM

## 2020-06-08 MED ORDER — SERTRALINE HCL 100 MG PO TABS: 100.0000 mg | ORAL_TABLET | Freq: Every day | ORAL | 1 refills | Status: DC

## 2020-06-08 MED ORDER — NORETHINDRONE 0.35 MG PO TABS: 1.0000 | ORAL_TABLET | Freq: Every day | ORAL | 3 refills | Status: DC

## 2020-06-08 NOTE — Progress Notes (Signed)
BP 104/64   Pulse 69   Temp 98.6 F (37 C) (Oral)   Wt 123 lb 3.2 oz (55.9 kg)   LMP  (LMP Unknown)   SpO2 99%   BMI 22.53 kg/m    Subjective:    Patient ID: Angela [name], female    DOB: [birth year], 35 y.o.   MRN: [id number]  HPI: Angela [name] is a 35 y.o. female  Chief Complaint  Patient presents with  . Depression   DEPRESSION Mood status: stable Satisfied with current treatment?: yes Symptom severity: mild  Duration of current treatment : chronic Side effects: no Medication compliance: excellent compliance Psychotherapy/counseling: no  Previous psychiatric medications: sertaline Depressed mood: no Anxious mood: yes Anhedonia: no Significant weight loss or gain: no Insomnia: no  Fatigue: no Feelings of worthlessness or guilt: no Impaired concentration/indecisiveness: no Suicidal ideations: no Hopelessness: no Crying spells: no Depression screen [facility] 2/9 06/08/2020 12/09/2019 10/14/2019 02/21/2019 06/28/2018  Decreased Interest 0 0 0 0 0  Down, Depressed, Hopeless 1 0 1 0 0  PHQ - 2 Score 1 0 1 0 0  Altered sleeping 0 0 0 0 0  Tired, decreased energy 0 0 0 0 3  Change in appetite 0 0 0 0 0  Feeling bad or failure about yourself  0 0 1 0 0  Trouble concentrating 3 0 3 0 0  Moving slowly or fidgety/restless 0 0 1 0 0  Suicidal thoughts 0 0 0 0 0  PHQ-9 Score 4 0 6 0 3  Difficult doing work/chores Not difficult at all Not difficult at all Not difficult at all Not difficult at all Not difficult at all   GAD 7 : Generalized Anxiety Score 06/08/2020 12/09/2019 10/14/2019 02/21/2019  Nervous, Anxious, on Edge 2 0 1 0  Control/stop worrying 2 0 1 0  Worry too much - different things 1 0 0 0  Trouble relaxing 2 0 2 3  Restless 3 0 2 0  Easily annoyed or irritable 1 2 1 3   Afraid - awful might happen 0 0 0 0  Total GAD 7 Score 11 2 7 6   Anxiety Difficulty Not difficult at all Not difficult at all Not difficult at all Not difficult at all    Relevant past  medical, surgical, family and social history reviewed and updated as indicated. Interim medical history since our last visit reviewed. Allergies and medications reviewed and updated.  Review of Systems  Per HPI unless specifically indicated above     Objective:    BP 104/64   Pulse 69   Temp 98.6 F (37 C) (Oral)   Wt 123 lb 3.2 oz (55.9 kg)   LMP  (LMP Unknown)   SpO2 99%   BMI 22.53 kg/m   Wt Readings from Last 3 Encounters:  06/08/20 123 lb 3.2 oz (55.9 kg)  12/09/19 145 lb (65.8 kg)  05/15/19 143 lb 9.6 oz (65.1 kg)    Physical Exam  Results for orders placed or performed in visit on 12/09/19  CBC with Differential/Platelet  Result Value Ref Range   WBC 4.3 3.4 - 10.8 x10E3/uL   RBC 4.60 3.77 - 5.28 x10E6/uL   Hemoglobin 11.9 11.1 - 15.9 g/dL   Hematocrit 05/17/19 12/11/19 - 46.6 %   MCV 83 79 - 97 fL   MCH 25.9 (L) 26.6 - 33.0 pg   MCHC 31.2 (L) 31.5 - 35.7 g/dL   RDW 31.5 40.0 - 86.7 %   Platelets 209 150 -  450 x10E3/uL   Neutrophils 62 Not Estab. %   Lymphs 30 Not Estab. %   Monocytes 6 Not Estab. %   Eos 1 Not Estab. %   Basos 1 Not Estab. %   Neutrophils Absolute 2.7 1.4 - 7.0 x10E3/uL   Lymphocytes Absolute 1.3 0.7 - 3.1 x10E3/uL   Monocytes Absolute 0.3 0.1 - 0.9 x10E3/uL   EOS (ABSOLUTE) 0.0 0.0 - 0.4 x10E3/uL   Basophils Absolute 0.0 0.0 - 0.2 x10E3/uL   Immature Granulocytes 0 Not Estab. %   Immature Grans (Abs) 0.0 0.0 - 0.1 x10E3/uL  Comprehensive metabolic panel  Result Value Ref Range   Glucose 122 (H) 65 - 99 mg/dL   BUN 11 6 - 20 mg/dL   Creatinine, Ser 5.91 0.57 - 1.00 mg/dL   GFR calc non Af Amer 120 >59 mL/min/1.73   GFR calc Af Amer 138 >59 mL/min/1.73   BUN/Creatinine Ratio 18 9 - 23   Sodium 142 134 - 144 mmol/L   Potassium 4.5 3.5 - 5.2 mmol/L   Chloride 103 96 - 106 mmol/L   CO2 23 20 - 29 mmol/L   Calcium 9.1 8.7 - 10.2 mg/dL   Total Protein 7.4 6.0 - 8.5 g/dL   Albumin 4.9 (H) 3.8 - 4.8 g/dL   Globulin, Total 2.5 1.5 - 4.5 g/dL    Albumin/Globulin Ratio 2.0 1.2 - 2.2   Bilirubin Total 0.2 0.0 - 1.2 mg/dL   Alkaline Phosphatase 62 44 - 121 IU/L   AST 41 (H) 0 - 40 IU/L   ALT 10 0 - 32 IU/L  Lipid Panel w/o Chol/HDL Ratio  Result Value Ref Range   Cholesterol, Total 173 100 - 199 mg/dL   Triglycerides 638 0 - 149 mg/dL   HDL 43 >46 mg/dL   VLDL Cholesterol Cal 22 5 - 40 mg/dL   LDL Chol Calc (NIH) 659 (H) 0 - 99 mg/dL  TSH  Result Value Ref Range   TSH 2.610 0.450 - 4.500 uIU/mL  Urinalysis, Routine w reflex microscopic  Result Value Ref Range   Specific Gravity, UA <1.005 (L) 1.005 - 1.030   pH, UA 6.5 5.0 - 7.5   Color, UA Yellow Yellow   Appearance Ur Clear Clear   Leukocytes,UA Negative Negative   Protein,UA Negative Negative/Trace   Glucose, UA Negative Negative   Ketones, UA Negative Negative   RBC, UA Negative Negative   Bilirubin, UA Negative Negative   Urobilinogen, Ur 0.2 0.2 - 1.0 mg/dL   Nitrite, UA Negative Negative  Hepatitis C Antibody  Result Value Ref Range   Hep C Virus Ab <0.1 0.0 - 0.9 s/co ratio      Assessment & Plan:   Problem List Items Addressed This Visit      Other   Recurrent major depressive disorder, in partial remission (HCC) - Primary    Under good control on current regimen, anxiety up a bit. Will work on self care. Continue current regimen. Continue to monitor. Call with any concerns. Refills given. If not doing better in the next 3 months or so, let us know and we'll increase dose.        Relevant Medications   sertraline (ZOLOFT) 100 MG tablet    Other Visit Diagnoses    Weight loss       Unintended weight loss of 20lbs in the last 6 months- will check labs today. Possibly due to anxiety. Continue to monitor closely.   Relevant Orders   TSH  Comprehensive metabolic panel   CBC with Differential/Platelet       Follow up plan: Return in about 6 months (around 12/08/2020) for physical.

## 2020-06-08 NOTE — Patient Instructions (Signed)
The Mindfulness App.  ?Headspace.  ?Calm.  ?MINDBODY.  ?Buddhify.  ?Insight Timer.  ?Smiling Mind.  ?Meditation Timer Pro.  ?Aura  ? ?

## 2020-06-08 NOTE — Assessment & Plan Note (Signed)
Under good control on current regimen, anxiety up a bit. Will work on self care. Continue current regimen. Continue to monitor. Call with any concerns. Refills given. If not doing better in the next 3 months or so, let us know and we'll increase dose.

## 2020-06-09 LAB — CBC WITH DIFFERENTIAL/PLATELET
Basophils Absolute: 0 10*3/uL (ref 0.0–0.2)
Basos: 1 %
EOS (ABSOLUTE): 0 10*3/uL (ref 0.0–0.4)
Eos: 1 %
Hematocrit: 37.9 % (ref 34.0–46.6)
Hemoglobin: 12.3 g/dL (ref 11.1–15.9)
Immature Grans (Abs): 0 10*3/uL (ref 0.0–0.1)
Immature Granulocytes: 0 %
Lymphocytes Absolute: 1.2 10*3/uL (ref 0.7–3.1)
Lymphs: 37 %
MCH: 26.9 pg (ref 26.6–33.0)
MCHC: 32.5 g/dL (ref 31.5–35.7)
MCV: 83 fL (ref 79–97)
Monocytes Absolute: 0.3 10*3/uL (ref 0.1–0.9)
Monocytes: 8 %
Neutrophils Absolute: 1.7 10*3/uL (ref 1.4–7.0)
Neutrophils: 53 %
Platelets: 231 10*3/uL (ref 150–450)
RBC: 4.57 x10E6/uL (ref 3.77–5.28)
RDW: 13.4 % (ref 11.7–15.4)
WBC: 3.2 10*3/uL — ABNORMAL LOW (ref 3.4–10.8)

## 2020-06-09 LAB — COMPREHENSIVE METABOLIC PANEL
ALT: 15 IU/L (ref 0–32)
AST: 17 IU/L (ref 0–40)
Albumin/Globulin Ratio: 2.1 (ref 1.2–2.2)
Albumin: 5 g/dL — ABNORMAL HIGH (ref 3.8–4.8)
Alkaline Phosphatase: 61 IU/L (ref 44–121)
BUN/Creatinine Ratio: 21 (ref 9–23)
BUN: 13 mg/dL (ref 6–20)
Bilirubin Total: <0.2 mg/dL (ref 0.0–1.2)
CO2: 23 mmol/L (ref 20–29)
Calcium: 9.4 mg/dL (ref 8.7–10.2)
Chloride: 103 mmol/L (ref 96–106)
Creatinine, Ser: 0.61 mg/dL (ref 0.57–1.00)
Globulin, Total: 2.4 g/dL (ref 1.5–4.5)
Glucose: 84 mg/dL (ref 65–99)
Potassium: 4.4 mmol/L (ref 3.5–5.2)
Sodium: 142 mmol/L (ref 134–144)
Total Protein: 7.4 g/dL (ref 6.0–8.5)
eGFR: 121 mL/min/{1.73_m2} (ref >59)

## 2020-06-09 LAB — TSH: TSH: 1.91 u[IU]/mL (ref 0.450–4.500)

## 2020-07-22 ENCOUNTER — Ambulatory Visit (INDEPENDENT_AMBULATORY_CARE_PROVIDER_SITE_OTHER): Payer: Managed Care, Other (non HMO) | Admitting: Obstetrics and Gynecology

## 2020-07-22 ENCOUNTER — Other Ambulatory Visit: Payer: Self-pay

## 2020-07-22 ENCOUNTER — Encounter: Payer: Self-pay | Admitting: Obstetrics and Gynecology

## 2020-07-22 VITALS — BP 116/75 | HR 75 | Ht 62.0 in | Wt 125.2 lb

## 2020-07-22 DIAGNOSIS — Z30017 Encounter for initial prescription of implantable subdermal contraceptive: Secondary | ICD-10-CM

## 2020-07-22 MED ORDER — ETONOGESTREL 68 MG ~~LOC~~ IMPL
68.0000 mg | DRUG_IMPLANT | Freq: Once | SUBCUTANEOUS | Status: AC
  Administered 2020-07-22: 68 mg via SUBCUTANEOUS

## 2020-07-22 NOTE — Progress Notes (Signed)
    Contraception/Family Planning VISIT ENCOUNTER NOTE  Subjective:   Angela [name] is a 35 y.o. G34P2012 female here for reproductive life counseling.  Reports she does not want a pregnancy in the next year. Denies abnormal vaginal bleeding, discharge, pelvic pain, problems with intercourse or other gynecologic concerns.    Gynecologic History Patient's last menstrual period was 07/12/2020 (approximate). Contraception: oral progesterone-only contraceptive  Health Maintenance Due  Topic Date Due  . COVID-19 Vaccine (1) Never done  . PAP SMEAR-Modifier  07/05/2020     The following portions of the patient's history were reviewed and updated as appropriate: allergies, current medications, past family history, past medical history, past social history, past surgical history and problem list.  Review of Systems Pertinent items are noted in HPI.   Objective:  BP 116/75   Pulse 75   Ht 5\' 2"  (1.575 m)   Wt 125 lb 3.2 oz (56.8 kg)   LMP 07/12/2020 (Approximate)   Breastfeeding No   BMI 22.90 kg/m  Gen: well appearing, NAD HEENT: no scleral icterus CV: RR Lung: Normal WOB Ext: warm well perfused   Assessment and Plan:   Contraception counseling: Reviewed all forms of birth control options available including abstinence; over the counter/barrier methods; hormonal contraceptive medication including pill, patch, ring, injection,contraceptive implant; hormonal and nonhormonal IUDs; permanent sterilization options including vasectomy and the various tubal sterilization modalities. Risks and benefits reviewed.  Questions were answered.  Written information was also given to the patient to review.  Patient desires nexplanon, this was prescribed for patient. She will follow up in  1 year for surveillance.  She was told to call with any further questions, or with any concerns about this method of contraception.  Emphasized use of condoms 100% of the time for STI prevention.  1. Insertion  of Nexplanon  - etonogestrel (NEXPLANON) implant 68 mg  Please refer to After Visit Summary for other counseling recommendations.   No follow-ups on file.  07/14/2020, NP, MPH, ABFM Attending Physician Faculty Practice- [facility]

## 2020-08-03 ENCOUNTER — Other Ambulatory Visit: Payer: Self-pay | Admitting: Family Medicine

## 2020-08-03 NOTE — Telephone Encounter (Signed)
Ib 06/08/2020 was given #84 with 3 refills.

## 2020-08-12 ENCOUNTER — Ambulatory Visit: Payer: BC Managed Care – PPO | Admitting: Family Medicine

## 2020-12-23 ENCOUNTER — Other Ambulatory Visit: Payer: Self-pay | Admitting: Family Medicine

## 2020-12-23 MED ORDER — SERTRALINE HCL 100 MG PO TABS: 100.0000 mg | ORAL_TABLET | Freq: Every day | ORAL | 0 refills | Status: DC

## 2020-12-23 NOTE — Telephone Encounter (Signed)
Copied from CRM [id number]. Topic: Quick Communication - Rx Refill/Question >> Dec 23, 2020 10:17 AM Marylen Ponto wrote: Medication: sertraline (ZOLOFT) 100 MG tablet  Has the patient contacted their pharmacy? Yes.  Pt request Rx be sent to new pharmacy (Agent: If no, request that the patient contact the pharmacy for the refill. If patient does not wish to contact the pharmacy document the reason why and proceed with request.) (Agent: If yes, when and what did the pharmacy advise?)  Preferred Pharmacy (with phone number or street name): [facility] [place]. - [place], [place] - [address] Phone: [phone]   Fax: [phone]  Has the patient been seen for an appointment in the last year OR does the patient have an upcoming appointment? Yes.    Agent: Please be advised that RX refills may take up to 3 business days. We ask that you follow-up with your pharmacy.

## 2020-12-23 NOTE — Telephone Encounter (Signed)
Courtesy refill. Patient will need an office visit for further refills. Requested Prescriptions  Pending Prescriptions Disp Refills  . sertraline (ZOLOFT) 100 MG tablet 30 tablet 0    Sig: Take 1 tablet (100 mg total) by mouth daily.     Psychiatry:  Antidepressants - SSRI Failed - 12/23/2020 10:39 AM      Failed - Valid encounter within last 6 months    Recent Outpatient Visits          6 months ago Recurrent major depressive disorder, in partial remission (HCC)   [facility] [place], [name], DO   1 year ago Routine general medical examination at a health care facility   [facility] [place], [place], DO   1 year ago COVID-19   [facility] W[place], [place], DO   1 year ago Depression with anxiety   [facility] [place], Bettles, DO   2 years ago Acute recurrent frontal sinusitis   [facility] [place], [name], DO             Passed - Completed PHQ-2 or PHQ-9 in the last 360 days

## 2021-02-17 ENCOUNTER — Other Ambulatory Visit: Payer: Self-pay | Admitting: Family Medicine

## 2021-02-17 NOTE — Telephone Encounter (Signed)
Requested medications are due for refill today.  yes  Requested medications are on the active medications list.  yes  Last refill. 12/23/2020  Future visit scheduled.   yes  Notes to clinic.  Pt already given a courtesy refill.     Requested Prescriptions  Pending Prescriptions Disp Refills   sertraline (ZOLOFT) 100 MG tablet 30 tablet 0    Sig: Take 1 tablet (100 mg total) by mouth daily.     Psychiatry:  Antidepressants - SSRI Failed - 02/17/2021  9:00 AM      Failed - Valid encounter within last 6 months    Recent Outpatient Visits           8 months ago Recurrent major depressive disorder, in partial remission (HCC)   [facility] [place], [name], DO   1 year ago Routine general medical examination at a health care facility   [facility], [place], DO   1 year ago COVID-19   [facility] [place], [place], DO   1 year ago Depression with anxiety   [facility] [place], [place], DO   2 years ago Acute recurrent frontal sinusitis   [facility] [place], [name], DO       Future Appointments             In 2 days [name], [name], DO [facility], [facility]            Passed - Completed PHQ-2 or PHQ-9 in the last 360 days

## 2021-02-17 NOTE — Telephone Encounter (Signed)
Copied from CRM [id number]. Topic: Quick Communication - Rx Refill/Question >> Feb 17, 2021  8:30 AM Angela [name] wrote: Medication: sertraline (ZOLOFT) 100 MG tablet  Per patient she have been out more than [name] week  Has the patient contacted their pharmacy? Yes.   (Agent: If no, request that the patient contact the pharmacy for the refill. If patient does not wish to contact the pharmacy document the reason why and proceed with request.) (Agent: If yes, when and what did the pharmacy advise?)  Preferred Pharmacy (with phone number or street name): [facility] [place]. - [place], [place] - [address]  Phone:  [phone] Fax:  [phone]    Has the patient been seen for an appointment in the last year OR does the patient have an upcoming appointment? Yes.    Agent: Please be advised that RX refills may take up to 3 business days. We ask that you follow-up with your pharmacy.

## 2021-02-19 ENCOUNTER — Encounter: Payer: Self-pay | Admitting: Family Medicine

## 2021-02-19 ENCOUNTER — Telehealth (INDEPENDENT_AMBULATORY_CARE_PROVIDER_SITE_OTHER): Payer: Managed Care, Other (non HMO) | Admitting: Family Medicine

## 2021-02-19 ENCOUNTER — Telehealth: Payer: Self-pay | Admitting: Family Medicine

## 2021-02-19 DIAGNOSIS — L233 Allergic contact dermatitis due to drugs in contact with skin: Secondary | ICD-10-CM

## 2021-02-19 DIAGNOSIS — F3341 Major depressive disorder, recurrent, in partial remission: Secondary | ICD-10-CM | POA: Diagnosis not present

## 2021-02-19 DIAGNOSIS — N644 Mastodynia: Secondary | ICD-10-CM | POA: Diagnosis not present

## 2021-02-19 MED ORDER — SERTRALINE HCL 100 MG PO TABS: 100.0000 mg | ORAL_TABLET | Freq: Every day | ORAL | 1 refills | Status: DC

## 2021-02-19 MED ORDER — TRIAMCINOLONE ACETONIDE 0.5 % EX OINT: 1.0000 "application " | TOPICAL_OINTMENT | Freq: Two times a day (BID) | CUTANEOUS | 3 refills | Status: DC

## 2021-02-19 NOTE — Progress Notes (Signed)
Unable to leave vm to schedule physical

## 2021-02-19 NOTE — Assessment & Plan Note (Signed)
Under good control on current regimen. Continue current regimen. Continue to monitor. Call with any concerns. Refills given.   

## 2021-02-19 NOTE — Telephone Encounter (Signed)
Called pt to schedule follow up appt with Dr [name]. Provider wants pt to schedule a physical in 6 months. Unable to leave vm to let pt know.

## 2021-02-19 NOTE — Progress Notes (Signed)
There were no vitals taken for this visit.   Subjective:    Patient ID: Angela [name], female    DOB: 13-Nov-1985, 36 y.o.   MRN: [id number]  HPI: Angela [name] is a 36 y.o. female  Chief Complaint  Patient presents with   Depression   Rash    Patient states she has on rash on both her arms, rash is itchy and red. Rash comes and goes. Patient noticed rash since June after she got nexplanon inserted.    Breast Pain    Patient states for the past 3 weeks her breast feels sore off and on.    DEPRESSION Mood status: controlled Satisfied with current treatment?: yes Symptom severity: mild  Duration of current treatment : chronic Side effects: no Medication compliance: excellent compliance Psychotherapy/counseling: no  Previous psychiatric medications: sertraline Depressed mood: no Anxious mood: yes Anhedonia: no Significant weight loss or gain: no Insomnia: no  Fatigue: yes Feelings of worthlessness or guilt: no Impaired concentration/indecisiveness: no Suicidal ideations: no Hopelessness: no Crying spells: no Depression screen [facility] 2/9 02/19/2021 06/08/2020 12/09/2019 10/14/2019 02/21/2019  Decreased Interest 0 0 0 0 0  Down, Depressed, Hopeless 0 1 0 1 0  PHQ - 2 Score 0 1 0 1 0  Altered sleeping 0 0 0 0 0  Tired, decreased energy 0 0 0 0 0  Change in appetite 0 0 0 0 0  Feeling bad or failure about yourself  0 0 0 1 0  Trouble concentrating 3 3 0 3 0  Moving slowly or fidgety/restless 0 0 0 1 0  Suicidal thoughts 0 0 0 0 0  PHQ-9 Score 3 4 0 6 0  Difficult doing work/chores Not difficult at all Not difficult at all Not difficult at all Not difficult at all Not difficult at all  Some recent data might be hidden   GAD 7 : Generalized Anxiety Score 02/19/2021 06/08/2020 12/09/2019 10/14/2019  Nervous, Anxious, on Edge 0 2 0 1  Control/stop worrying 0 2 0 1  Worry too much - different things 0 1 0 0  Trouble relaxing 3 2 0 2  Restless 3 3 0 2  Easily annoyed or  irritable 0 _0 Afraid - awful might happen 0 0 0 0  Total GAD 7 Score _1 Anxiety Difficulty Not difficult at all Not difficult at all Not difficult at all Not difficult at all   RASH- every since she got her nexaplanon. Coming and going Duration:  months  Location: bilateral arms  Itching: yes Burning: yes Redness: yes Oozing: no Scaling: no Blisters: no Painful: no Fevers: no Change in detergents/soaps/personal care products: no Recent illness: no Recent travel:no History of same: yes Context: fluctuating Alleviating factors: lotion/moisturizer Treatments attempted:lotion/moisturizer Shortness of breath: no  Throat/tongue swelling: no Myalgias/arthralgias: no  BREAST PAIN- drinking 4+ cups of coffee a day Duration :3 weeks Location: bilateral Onset: gradual Severity: mild Quality: aching and sore Frequency: intermittant Redness: no Swelling: no Trauma: no trauma Breastfeeding: no Associated with menstral cycle: yes Nipple discharge: no Breast lump: no Status: fluctuating Treatments attempted: none Previous mammogram: no   Relevant past medical, surgical, family and social history reviewed and updated as indicated. Interim medical history since our last visit reviewed. Allergies and medications reviewed and updated.  Review of Systems  Constitutional: Negative.   Respiratory: Negative.    Cardiovascular: Negative.   Gastrointestinal: Negative.   Musculoskeletal: Negative.   Skin: Negative.  Neurological: Negative.   Psychiatric/Behavioral:  Positive for decreased concentration. Negative for agitation, behavioral problems, confusion, dysphoric mood, hallucinations, self-injury, sleep disturbance and suicidal ideas. The patient is not nervous/anxious and is not hyperactive.    Per HPI unless specifically indicated above     Objective:    There were no vitals taken for this visit.  Wt Readings from Last 3 Encounters:  07/22/20 125 lb 3.2 oz  (56.8 kg)  06/08/20 123 lb 3.2 oz (55.9 kg)  12/09/19 145 lb (65.8 kg)    Physical Exam Vitals and nursing note reviewed.  Constitutional:      General: She is not in acute distress.    Appearance: Normal appearance. She is not ill-appearing, toxic-appearing or diaphoretic.  HENT:     Head: Normocephalic and atraumatic.     Right Ear: External ear normal.     Left Ear: External ear normal.     Nose: Nose normal.     Mouth/Throat:     Mouth: Mucous membranes are moist.     Pharynx: Oropharynx is clear.  Eyes:     General: No scleral icterus.       Right eye: No discharge.        Left eye: No discharge.     Extraocular Movements: Extraocular movements intact.     Conjunctiva/sclera: Conjunctivae normal.     Pupils: Pupils are equal, round, and reactive to light.  Cardiovascular:     Rate and Rhythm: Normal rate and regular rhythm.     Pulses: Normal pulses.     Heart sounds: Normal heart sounds. No murmur heard.   No friction rub. No gallop.  Pulmonary:     Effort: Pulmonary effort is normal. No respiratory distress.     Breath sounds: Normal breath sounds. No stridor. No wheezing, rhonchi or rales.  Chest:     Chest wall: No tenderness.  Musculoskeletal:        General: Normal range of motion.     Cervical back: Normal range of motion and neck supple.  Skin:    General: Skin is warm and dry.     Capillary Refill: Capillary refill takes less than 2 seconds.     Coloration: Skin is not jaundiced or pale.     Findings: Rash present. No bruising, erythema or lesion.  Neurological:     General: No focal deficit present.     Mental Status: She is alert and oriented to person, place, and time. Mental status is at baseline.  Psychiatric:        Mood and Affect: Mood normal.        Behavior: Behavior normal.        Thought Content: Thought content normal.        Judgment: Judgment normal.    Results for orders placed or performed in visit on 06/08/20  TSH  Result Value Ref  Range   TSH 1.910 0.450 - 4.500 uIU/mL  Comprehensive metabolic panel  Result Value Ref Range   Glucose 84 65 - 99 mg/dL   BUN 13 6 - 20 mg/dL   Creatinine, Ser 0.61 0.57 - 1.00 mg/dL   eGFR 121 >59 mL/min/1.73   BUN/Creatinine Ratio 21 9 - 23   Sodium 142 134 - 144 mmol/L   Potassium 4.4 3.5 - 5.2 mmol/L   Chloride 103 96 - 106 mmol/L   CO2 23 20 - 29 mmol/L   Calcium 9.4 8.7 - 10.2 mg/dL   Total Protein 7.4 6.0 - 8.5  g/dL   Albumin 5.0 (H) 3.8 - 4.8 g/dL   Globulin, Total 2.4 1.5 - 4.5 g/dL   Albumin/Globulin Ratio 2.1 1.2 - 2.2   Bilirubin Total <0.2 0.0 - 1.2 mg/dL   Alkaline Phosphatase 61 44 - 121 IU/L   AST 17 0 - 40 IU/L   ALT 15 0 - 32 IU/L  CBC with Differential/Platelet  Result Value Ref Range   WBC 3.2 (L) 3.4 - 10.8 x10E3/uL   RBC 4.57 3.77 - 5.28 x10E6/uL   Hemoglobin 12.3 11.1 - 15.9 g/dL   Hematocrit 37.9 34.0 - 46.6 %   MCV 83 79 - 97 fL   MCH 26.9 26.6 - 33.0 pg   MCHC 32.5 31.5 - 35.7 g/dL   RDW 13.4 11.7 - 15.4 %   Platelets 231 150 - 450 x10E3/uL   Neutrophils 53 Not Estab. %   Lymphs 37 Not Estab. %   Monocytes 8 Not Estab. %   Eos 1 Not Estab. %   Basos 1 Not Estab. %   Neutrophils Absolute 1.7 1.4 - 7.0 x10E3/uL   Lymphocytes Absolute 1.2 0.7 - 3.1 x10E3/uL   Monocytes Absolute 0.3 0.1 - 0.9 x10E3/uL   EOS (ABSOLUTE) 0.0 0.0 - 0.4 x10E3/uL   Basophils Absolute 0.0 0.0 - 0.2 x10E3/uL   Immature Granulocytes 0 Not Estab. %   Immature Grans (Abs) 0.0 0.0 - 0.1 x10E3/uL      Assessment & Plan:   Problem List Items Addressed This Visit       Other   Recurrent major depressive disorder, in partial remission ([place]) - Primary    Under good control on current regimen. Continue current regimen. Continue to monitor. Call with any concerns. Refills given.        Relevant Medications   sertraline (ZOLOFT) 100 MG tablet   Other Visit Diagnoses     Allergic contact dermatitis due to drugs in contact with skin       ? reaction to nexplanon- will  contact GYN about removal. Will treat with triamcinalone   Breast tenderness       Drinking a lot of coffee- will cut down. If not improving, will check [place].         Follow up plan: Return in about 6 months (around 08/19/2021), or physical.    This visit was completed via video visit through [facility] due to the restrictions of the COVID-19 pandemic. All issues as above were discussed and addressed. Physical exam was done as above through visual confirmation on video through MyChart. If it was felt that the patient should be evaluated in the office, they were directed there. The patient verbally consented to this visit. Location of the patient: home Location of the provider: work Those involved with this call:  Provider: Park Liter, DO CMA: Louanna Raw, [place] Desk/Registration: [employer]  Time spent on call:  25 minutes with patient face to face via video conference. More than 50% of this time was spent in counseling and coordination of care. 40 minutes total spent in review of patient's record and preparation of their chart.

## 2021-03-18 ENCOUNTER — Other Ambulatory Visit: Payer: Self-pay

## 2021-03-18 ENCOUNTER — Ambulatory Visit (INDEPENDENT_AMBULATORY_CARE_PROVIDER_SITE_OTHER): Payer: Managed Care, Other (non HMO) | Admitting: Obstetrics and Gynecology

## 2021-03-18 ENCOUNTER — Encounter: Payer: Self-pay | Admitting: Obstetrics and Gynecology

## 2021-03-18 VITALS — BP 121/60 | HR 81 | Ht 62.0 in | Wt 131.2 lb

## 2021-03-18 DIAGNOSIS — N921 Excessive and frequent menstruation with irregular cycle: Secondary | ICD-10-CM | POA: Diagnosis not present

## 2021-03-18 DIAGNOSIS — Z3046 Encounter for surveillance of implantable subdermal contraceptive: Secondary | ICD-10-CM

## 2021-03-18 DIAGNOSIS — N644 Mastodynia: Secondary | ICD-10-CM

## 2021-03-18 HISTORY — PX: REMOVAL OF IMPLANON ROD: OBO 1006

## 2021-03-18 MED ORDER — LO LOESTRIN FE 1 MG-10 MCG / 10 MCG PO TABS: 1.0000 | ORAL_TABLET | Freq: Every day | ORAL | 3 refills | Status: DC

## 2021-03-18 NOTE — Procedures (Signed)
Nexplanon Removal Procedure Note Patient had nexplanon placed in June 2022 and has had AUB with it since placement (sometimes three weeks of the month) and headaches, as well.   Prior to the procedure being performed, the patient  was asked to state their full name, date of birth, type of procedure being performed and the exact location of the operative site. This information was then checked against the documentation in the patient's chart. Prior to the procedure being performed, a "time out" was performed by the physician that confirmed the correct patient, procedure and site.  After informed consent was obtained, the patient's left arm was examined and the Nexplanon rod was noted to be easily palpable. The area was cleaned with alcohol then local anesthesia was infiltrated with 3 ml of 1% lidocaine with epinephrine. The area was prepped with betadine. Using sterile technique, the Nexplanon device was brought to the incision site. The capsule was scrapped off with the scalpel, the Nexplanon grasped with hemostats, and easily removed; the removal site was hemostatic. The Nexplanon was inspected and noted to be intact.  A steri-strip and a pressure dressing was applied.  The patient tolerated the procedure well.  She has used OCPs in the past. One kind gave her headaches but then another one did but is unsure which one. Will try lo loestrin  Pt also has had a month hx of random right breast sharp pains. No lumpbs, bumps or nipple d/c. Exam negative today with chaperone. Could be hormone related. I told her that if persists after 3 months to let me know and recommend u/s.   Cornelia Copa MD Attending Center for [employer] [facility])

## 2021-04-09 ENCOUNTER — Encounter: Payer: Self-pay | Admitting: Family Medicine

## 2021-06-05 ENCOUNTER — Other Ambulatory Visit: Payer: Self-pay | Admitting: Family Medicine

## 2021-06-07 NOTE — Telephone Encounter (Signed)
Requested medication (s) are due for refill today: Yes ? ?Requested medication (s) are on the active medication list: Yes ? ?Last refill:  02/19/21 ? ?Future visit scheduled: Yes ? ?Notes to clinic:  Protocol indicates pt. Needs lab work. ? ? ? ?Requested Prescriptions  ?Pending Prescriptions Disp Refills  ? sertraline (ZOLOFT) 100 MG tablet [Pharmacy Med Name: SERTRALINE HCL 100 MG TABLET] 90 tablet 0  ?  Sig: TAKE 1 TABLET BY MOUTH ONCE DAILY.  ?  ? Not Delegated - Psychiatry:  Antidepressants - SSRI - sertraline Failed - 06/05/2021  9:15 AM  ?  ?  Failed - This refill cannot be delegated  ?  ?  Failed - AST in normal range and within 360 days  ?  AST  ?Date Value Ref Range Status  ?06/08/2020 17 0 - 40 IU/L Final  ? ?SGOT(AST)  ?Date Value Ref Range Status  ?10/09/2011 13 (L) 15 - 37 Unit/L Final  ?  ?  ?  ?  Failed - ALT in normal range and within 360 days  ?  ALT  ?Date Value Ref Range Status  ?06/08/2020 15 0 - 32 IU/L Final  ? ?SGPT (ALT)  ?Date Value Ref Range Status  ?10/09/2011 17 12 - 78 U/L Final  ?  ?  ?  ?  Passed - Completed PHQ-2 or PHQ-9 in the last 360 days  ?  ?  Passed - Valid encounter within last 6 months  ?  Recent Outpatient Visits   ? ?      ? 3 months ago Recurrent major depressive disorder, in partial remission (HCC)  ? [facility] [place], [name], DO  ? 12 months ago Recurrent major depressive disorder, in partial remission (HCC)  ? [facility] [place], [place] P, DO  ? 1 year ago Routine general medical examination at a health care facility  ? [facility] [place], [name], DO  ? 1 year ago COVID-19  ? [facility] [place], [name], DO  ? 2 years ago Depression with anxiety  ? [facility] [place], [place] P, DO  ? ?  ?  ?Future Appointments   ? ?        ? In 2 months [name], [name], DO [facility]  ? ?  ? ? ?  ?  ?  ? ?

## 2021-07-01 ENCOUNTER — Encounter: Payer: Self-pay | Admitting: Family Medicine

## 2021-07-01 ENCOUNTER — Ambulatory Visit: Payer: Managed Care, Other (non HMO) | Admitting: Family Medicine

## 2021-07-01 ENCOUNTER — Ambulatory Visit (INDEPENDENT_AMBULATORY_CARE_PROVIDER_SITE_OTHER): Payer: Managed Care, Other (non HMO) | Admitting: Family Medicine

## 2021-07-01 VITALS — BP 120/83 | HR 75 | Temp 98.1°F | Wt 132.0 lb

## 2021-07-01 DIAGNOSIS — R202 Paresthesia of skin: Secondary | ICD-10-CM | POA: Diagnosis not present

## 2021-07-01 DIAGNOSIS — G44229 Chronic tension-type headache, not intractable: Secondary | ICD-10-CM | POA: Diagnosis not present

## 2021-07-01 DIAGNOSIS — Z13220 Encounter for screening for lipoid disorders: Secondary | ICD-10-CM

## 2021-07-01 LAB — URINALYSIS, ROUTINE W REFLEX MICROSCOPIC
Bilirubin, UA: NEGATIVE
Glucose, UA: NEGATIVE
Ketones, UA: NEGATIVE
Nitrite, UA: NEGATIVE
Protein,UA: NEGATIVE
RBC, UA: NEGATIVE
Specific Gravity, UA: 1.015 (ref 1.005–1.030)
Urobilinogen, Ur: 0.2 mg/dL (ref 0.2–1.0)
pH, UA: 7 (ref 5.0–7.5)

## 2021-07-01 LAB — MICROSCOPIC EXAMINATION: RBC, Urine: NONE SEEN /hpf (ref 0–2)

## 2021-07-01 LAB — BAYER DCA HB A1C WAIVED: HB A1C (BAYER DCA - WAIVED): 5.3 % (ref 4.8–5.6)

## 2021-07-01 MED ORDER — CYCLOBENZAPRINE HCL 10 MG PO TABS
5.0000 mg | ORAL_TABLET | Freq: Every day | ORAL | 1 refills | Status: DC
Start: 2021-07-01 — End: 2021-11-03

## 2021-07-01 NOTE — Progress Notes (Signed)
BP 120/83   Pulse 75   Temp 98.1 F (36.7 C)   Wt 132 lb (59.9 kg)   SpO2 98%   BMI 24.14 kg/m    Subjective:    Patient ID: Angela [name], female    DOB: 20-Aug-1985, 36 y.o.   MRN: [id number]  HPI: Angela [name] is a 36 y.o. female  Chief Complaint  Patient presents with   Headache    Patient states she has a  headache about 3-4 times a week for the past 6 months.    Fatigue    Patient states she is very fatigue daily    Tingling    Patient states her lips randomly begin to tingle, normally last a few years.    Has been having headaches about 3-4x a week for about the past 6 months. They are in the front and back of her head. She hasn't noticed that it's triggered by anything. She notes that her jaw and eyes can hurt when this comes on. Better with rest. Nothing makes it worse as it comes on randomly. Pain does not radiate. She notes that she has also been very tired and has been having some issues with her lips tingling for a while now. Probably a couple of years. She had some palpitations a few days ago, but her mood has been doing well. No concerns about panic attacks. She is otherwise feeling well with no other concerns or complaints today.  Relevant past medical, surgical, family and social history reviewed and updated as indicated. Interim medical history since our last visit reviewed. Allergies and medications reviewed and updated.  Review of Systems  Constitutional:  Positive for fatigue. Negative for activity change, appetite change, chills, diaphoresis, fever and unexpected weight change.  HENT: Negative.    Respiratory: Negative.    Cardiovascular: Negative.   Gastrointestinal: Negative.   Musculoskeletal: Negative.   Skin: Negative.   Neurological:  Positive for numbness and headaches. Negative for dizziness, tremors, seizures, syncope, facial asymmetry, speech difficulty, weakness and light-headedness.  Psychiatric/Behavioral:  Positive for dysphoric  mood. Negative for agitation, behavioral problems, confusion, decreased concentration, hallucinations, self-injury, sleep disturbance and suicidal ideas. The patient is nervous/anxious. The patient is not hyperactive.    Per HPI unless specifically indicated above     Objective:    BP 120/83   Pulse 75   Temp 98.1 F (36.7 C)   Wt 132 lb (59.9 kg)   SpO2 98%   BMI 24.14 kg/m   Wt Readings from Last 3 Encounters:  07/01/21 132 lb (59.9 kg)  03/18/21 131 lb 3.2 oz (59.5 kg)  07/22/20 125 lb 3.2 oz (56.8 kg)    Physical Exam Vitals and nursing note reviewed.  Constitutional:      General: She is not in acute distress.    Appearance: Normal appearance. She is well-developed and normal weight. She is not ill-appearing, toxic-appearing or diaphoretic.  HENT:     Head: Normocephalic and atraumatic.     Right Ear: External ear normal.     Left Ear: External ear normal.     Nose: Nose normal.     Mouth/Throat:     Mouth: Mucous membranes are moist.     Pharynx: Oropharynx is clear.  Eyes:     General: No scleral icterus.       Right eye: No discharge.        Left eye: No discharge.     Extraocular Movements: Extraocular movements intact.  Conjunctiva/sclera: Conjunctivae normal.     Pupils: Pupils are equal, round, and reactive to light.  Cardiovascular:     Rate and Rhythm: Normal rate and regular rhythm.     Pulses: Normal pulses.     Heart sounds: Normal heart sounds. No murmur heard.   No friction rub. No gallop.  Pulmonary:     Effort: Pulmonary effort is normal. No respiratory distress.     Breath sounds: Normal breath sounds. No stridor. No wheezing, rhonchi or rales.  Chest:     Chest wall: No tenderness.  Musculoskeletal:        General: Normal range of motion.     Cervical back: Normal range of motion and neck supple.  Skin:    General: Skin is warm and dry.     Capillary Refill: Capillary refill takes less than 2 seconds.     Coloration: Skin is not  jaundiced or pale.     Findings: No bruising, erythema, lesion or rash.  Neurological:     General: No focal deficit present.     Mental Status: She is alert and oriented to person, place, and time. Mental status is at baseline.  Psychiatric:        Mood and Affect: Mood normal.        Behavior: Behavior normal.        Thought Content: Thought content normal.        Judgment: Judgment normal.    Results for orders placed or performed in visit on 06/08/20  TSH  Result Value Ref Range   TSH 1.910 0.450 - 4.500 uIU/mL  Comprehensive metabolic panel  Result Value Ref Range   Glucose 84 65 - 99 mg/dL   BUN 13 6 - 20 mg/dL   Creatinine, Ser 0.61 0.57 - 1.00 mg/dL   eGFR 121 >59 mL/min/1.73   BUN/Creatinine Ratio 21 9 - 23   Sodium 142 134 - 144 mmol/L   Potassium 4.4 3.5 - 5.2 mmol/L   Chloride 103 96 - 106 mmol/L   CO2 23 20 - 29 mmol/L   Calcium 9.4 8.7 - 10.2 mg/dL   Total Protein 7.4 6.0 - 8.5 g/dL   Albumin 5.0 (H) 3.8 - 4.8 g/dL   Globulin, Total 2.4 1.5 - 4.5 g/dL   Albumin/Globulin Ratio 2.1 1.2 - 2.2   Bilirubin Total <0.2 0.0 - 1.2 mg/dL   Alkaline Phosphatase 61 44 - 121 IU/L   AST 17 0 - 40 IU/L   ALT 15 0 - 32 IU/L  CBC with Differential/Platelet  Result Value Ref Range   WBC 3.2 (L) 3.4 - 10.8 x10E3/uL   RBC 4.57 3.77 - 5.28 x10E6/uL   Hemoglobin 12.3 11.1 - 15.9 g/dL   Hematocrit 37.9 34.0 - 46.6 %   MCV 83 79 - 97 fL   MCH 26.9 26.6 - 33.0 pg   MCHC 32.5 31.5 - 35.7 g/dL   RDW 13.4 11.7 - 15.4 %   Platelets 231 150 - 450 x10E3/uL   Neutrophils 53 Not Estab. %   Lymphs 37 Not Estab. %   Monocytes 8 Not Estab. %   Eos 1 Not Estab. %   Basos 1 Not Estab. %   Neutrophils Absolute 1.7 1.4 - 7.0 x10E3/uL   Lymphocytes Absolute 1.2 0.7 - 3.1 x10E3/uL   Monocytes Absolute 0.3 0.1 - 0.9 x10E3/uL   EOS (ABSOLUTE) 0.0 0.0 - 0.4 x10E3/uL   Basophils Absolute 0.0 0.0 - 0.2 x10E3/uL   Immature Granulocytes 0  Not Estab. %   Immature Grans (Abs) 0.0 0.0 - 0.1  x10E3/uL      Assessment & Plan:   Problem List Items Addressed This Visit   None Visit Diagnoses     Chronic tension-type headache, not intractable    -  Primary   Will start stretches and flexeril and check labs. Recheck 1 month. If not better, consider MRI given new onset headache.   Relevant Medications   cyclobenzaprine (FLEXERIL) 10 MG tablet   Other Relevant Orders   CBC with Differential/Platelet   Comprehensive metabolic panel   Urinalysis, Routine w reflex microscopic   TSH   Bayer DCA Hb S5B Waived   Ehrlichia Antibody Panel   Babesia microti Antibody Panel   Rocky mtn spotted fvr abs pnl(IgG+IgM)   Lyme Disease Serology w/Reflex   Paresthesias       Will check thyroid and tick labs. Await results. ?TMJ- will treat with flexril. Call with any concerns.    Relevant Orders   CBC with Differential/Platelet   Comprehensive metabolic panel   Urinalysis, Routine w reflex microscopic   TSH   Bayer DCA Hb N3W Waived   Ehrlichia Antibody Panel   Babesia microti Antibody Panel   Rocky mtn spotted fvr abs pnl(IgG+IgM)   Lyme Disease Serology w/Reflex   Screening for cholesterol level       Labs drawn today. Await results.    Relevant Orders   Lipid Panel w/o Chol/HDL Ratio        Follow up plan: Return in about 4 weeks (around 07/29/2021) for physical.

## 2021-07-02 LAB — LYME DISEASE SEROLOGY W/REFLEX: Lyme Total Antibody EIA: NEGATIVE

## 2021-07-06 ENCOUNTER — Encounter: Payer: Self-pay | Admitting: Family Medicine

## 2021-07-06 LAB — COMPREHENSIVE METABOLIC PANEL
ALT: 10 IU/L (ref 0–32)
AST: 26 IU/L (ref 0–40)
Albumin/Globulin Ratio: 1.7 (ref 1.2–2.2)
Albumin: 4.7 g/dL (ref 3.8–4.8)
Alkaline Phosphatase: 55 IU/L (ref 44–121)
BUN/Creatinine Ratio: 15 (ref 9–23)
BUN: 11 mg/dL (ref 6–20)
Bilirubin Total: 0.2 mg/dL (ref 0.0–1.2)
CO2: 21 mmol/L (ref 20–29)
Calcium: 9.2 mg/dL (ref 8.7–10.2)
Chloride: 102 mmol/L (ref 96–106)
Creatinine, Ser: 0.74 mg/dL (ref 0.57–1.00)
Globulin, Total: 2.7 g/dL (ref 1.5–4.5)
Glucose: 90 mg/dL (ref 70–99)
Potassium: 4.4 mmol/L (ref 3.5–5.2)
Sodium: 140 mmol/L (ref 134–144)
Total Protein: 7.4 g/dL (ref 6.0–8.5)
eGFR: 109 mL/min/{1.73_m2} (ref >59)

## 2021-07-06 LAB — LIPID PANEL W/O CHOL/HDL RATIO
Cholesterol, Total: 184 mg/dL (ref 100–199)
HDL: 43 mg/dL (ref >39)
LDL Chol Calc (NIH): 111 mg/dL — ABNORMAL HIGH (ref 0–99)
Triglycerides: 170 mg/dL — ABNORMAL HIGH (ref 0–149)
VLDL Cholesterol Cal: 30 mg/dL (ref 5–40)

## 2021-07-06 LAB — CBC WITH DIFFERENTIAL/PLATELET
Basophils Absolute: 0 10*3/uL (ref 0.0–0.2)
Basos: 0 %
EOS (ABSOLUTE): 0 10*3/uL (ref 0.0–0.4)
Eos: 1 %
Hematocrit: 39.3 % (ref 34.0–46.6)
Hemoglobin: 12.9 g/dL (ref 11.1–15.9)
Immature Grans (Abs): 0 10*3/uL (ref 0.0–0.1)
Immature Granulocytes: 0 %
Lymphocytes Absolute: 1.3 10*3/uL (ref 0.7–3.1)
Lymphs: 38 %
MCH: 26.6 pg (ref 26.6–33.0)
MCHC: 32.8 g/dL (ref 31.5–35.7)
MCV: 81 fL (ref 79–97)
Monocytes Absolute: 0.3 10*3/uL (ref 0.1–0.9)
Monocytes: 7 %
Neutrophils Absolute: 1.9 10*3/uL (ref 1.4–7.0)
Neutrophils: 54 %
Platelets: 228 10*3/uL (ref 150–450)
RBC: 4.85 x10E6/uL (ref 3.77–5.28)
RDW: 13.3 % (ref 11.7–15.4)
WBC: 3.5 10*3/uL (ref 3.4–10.8)

## 2021-07-06 LAB — ROCKY MTN SPOTTED FVR ABS PNL(IGG+IGM)
RMSF IgG: NEGATIVE
RMSF IgM: 0.9 index — ABNORMAL HIGH (ref 0.00–0.89)

## 2021-07-06 LAB — EHRLICHIA ANTIBODY PANEL
E. Chaffeensis (HME) IgM Titer: NEGATIVE
E.Chaffeensis (HME) IgG: NEGATIVE
HGE IgG Titer: NEGATIVE
HGE IgM Titer: NEGATIVE

## 2021-07-06 LAB — TSH: TSH: 2.77 u[IU]/mL (ref 0.450–4.500)

## 2021-07-06 LAB — BABESIA MICROTI ANTIBODY PANEL
Babesia microti IgG: <1:10 {titer}
Babesia microti IgM: <1:10 {titer}

## 2021-08-06 DIAGNOSIS — F419 Anxiety disorder, unspecified: Secondary | ICD-10-CM | POA: Insufficient documentation

## 2021-08-23 ENCOUNTER — Encounter: Payer: Managed Care, Other (non HMO) | Admitting: Family Medicine

## 2021-08-30 ENCOUNTER — Encounter: Payer: Managed Care, Other (non HMO) | Admitting: Family Medicine

## 2021-09-22 ENCOUNTER — Ambulatory Visit (INDEPENDENT_AMBULATORY_CARE_PROVIDER_SITE_OTHER): Payer: Managed Care, Other (non HMO) | Admitting: Family Medicine

## 2021-09-22 ENCOUNTER — Encounter: Payer: Self-pay | Admitting: Family Medicine

## 2021-09-22 VITALS — Temp 97.8°F | Wt 133.7 lb

## 2021-09-22 DIAGNOSIS — G43809 Other migraine, not intractable, without status migrainosus: Secondary | ICD-10-CM | POA: Diagnosis not present

## 2021-09-22 DIAGNOSIS — R42 Dizziness and giddiness: Secondary | ICD-10-CM

## 2021-09-22 DIAGNOSIS — Z30011 Encounter for initial prescription of contraceptive pills: Secondary | ICD-10-CM

## 2021-09-22 DIAGNOSIS — L568 Other specified acute skin changes due to ultraviolet radiation: Secondary | ICD-10-CM

## 2021-09-22 NOTE — Progress Notes (Signed)
Temp 97.8 F (36.6 C)   Wt 133 lb 11.2 oz (60.6 kg)   SpO2 99%   BMI 24.45 kg/m    Subjective:    Patient ID: Angela [name], female    DOB: 13-Sep-1985, 36 y.o.   MRN: [id number]  HPI: Angela [name] is a 36 y.o. female  Chief Complaint  Patient presents with  . Rash    Patient states she has a rash on her face on her cheeks for about a week. Not itchy or burning just red.   Marland Kitchen Headache    Patient states she continues to have headaches. Patient gets a headache 1-3 times a month, headaches normally last 2-3 days. Does not get relief with muscle relaxer or OTC pain medications.   . Dizziness    Patient states she woke up on 09/03/21 and the room was spinning, was seen at [facility]. Patient was told her ears were inflamed and was given prednisone. Dizziness went away but has came back since Sunday. Patient states it is getting better but still feels off balance especially when she sits down.   . Contraception    Patient states the birth control pill she is on does not regulate her, that was the same issue she was having with the nexplanon.    DIZZINESS Duration: 2 weeks Description of symptoms: off kilter, spins Duration of episode: most of the day, hours Dizziness frequency: 2x  Provoking factors: unknown Aggravating factors:  unknown Triggered by rolling over in bed: no Triggered by bending over: no Aggravated by head movement: no Aggravated by exertion, coughing, loud noises: no Recent head injury: no Recent or current viral symptoms: no History of vasovagal episodes: no Nausea: no Vomiting: no Tinnitus: no Hearing loss: no Aural fullness: no Headache: yes Photophobia/phonophobia: yes Unsteady gait: no Postural instability: no Diplopia, dysarthria, dysphagia or weakness: no Related to exertion: no Pallor: no Diaphoresis: no Dyspnea: no Chest pain: no  MIGRAINES Duration: {Blank single:19197::"chronic","days","weeks","months","years"} Onset: {Blank  single:19197::"sudden","gradual"} Severity: {Blank single:19197::"mild","moderate","severe","1/10","2/10","3/10","4/10","5/10","6/10","7/10","8/10","9/10","10/10"} Quality: {Blank multiple:19196::"sharp","dull","aching","burning","cramping","ill-defined","itchy","pressure-like","pulling","shooting","sore","stabbing","tender","tearing","throbbing"} Frequency: {Blank single:19197::"constant","intermittent","occasional","rare","every few minutes","a few times a hour","a few times a day","a few times a week","a few times a month","a few times a year"} Location:  Headache duration: Radiation: {Blank single:19197::"yes","no"} Time of day headache occurs:  Alleviating factors:  Aggravating factors:  Headache status at time of visit: {Blank single:19197::"current headache","asymptomatic"} Treatments attempted: Treatments attempted: {Blank multiple:19196::"none","rest","ice","heat","APAP","ibuprofen","aleve", excedrine","triptans","propranolol","topamax","amitriptyline"}   Aura: {Blank single:19197::"yes","no"} Nausea:  {Blank single:19197::"yes","no"} Vomiting: {Blank single:19197::"yes","no"} Photophobia:  {Blank single:19197::"yes","no"} Phonophobia:  {Blank single:19197::"yes","no"} Effect on social functioning:  {Blank single:19197::"yes","no"} Numbers of missed days of school/work each month:  Confusion:  {Blank single:19197::"yes","no"} Gait disturbance/ataxia:  {Blank single:19197::"yes","no"} Behavioral changes:  {Blank single:19197::"yes","no"} Fevers:  {Blank single:19197::"yes","no"}    Relevant past medical, surgical, family and social history reviewed and updated as indicated. Interim medical history since our last visit reviewed. Allergies and medications reviewed and updated.  Review of Systems  Constitutional: Negative.   Respiratory: Negative.    Cardiovascular: Negative.   Gastrointestinal: Negative.   Musculoskeletal: Negative.   Neurological:  Positive for dizziness,  light-headedness and headaches. Negative for tremors, seizures, syncope, facial asymmetry, speech difficulty, weakness and numbness.  Psychiatric/Behavioral: Negative.      Per HPI unless specifically indicated above     Objective:    Temp 97.8 F (36.6 C)   Wt 133 lb 11.2 oz (60.6 kg)   SpO2 99%   BMI 24.45 kg/m   Wt Readings from Last 3 Encounters:  09/22/21 133 lb 11.2 oz (60.6 kg)  07/01/21 132 lb (59.9 kg)  03/18/21 131 lb 3.2 oz (59.5 kg)    Physical Exam Vitals and nursing note reviewed.  Constitutional:      General: She is not in acute distress.    Appearance: Normal appearance. She is well-developed and normal weight. She is not ill-appearing, toxic-appearing or diaphoretic.  HENT:     Head: Normocephalic and atraumatic.     Right Ear: External ear normal.     Left Ear: External ear normal.     Nose: Nose normal.     Mouth/Throat:     Mouth: Mucous membranes are moist.     Pharynx: Oropharynx is clear.  Eyes:     General: No scleral icterus.       Right eye: No discharge.        Left eye: No discharge.     Extraocular Movements: Extraocular movements intact.     Right eye: Normal extraocular motion.     Left eye: Normal extraocular motion.     Conjunctiva/sclera: Conjunctivae normal.     Pupils: Pupils are equal, round, and reactive to light. Pupils are equal.  Cardiovascular:     Rate and Rhythm: Normal rate and regular rhythm.     Pulses: Normal pulses.     Heart sounds: Normal heart sounds. No murmur heard.    No friction rub. No gallop.  Pulmonary:     Effort: Pulmonary effort is normal. No respiratory distress.     Breath sounds: Normal breath sounds. No stridor. No wheezing, rhonchi or rales.  Chest:     Chest wall: No tenderness.  Musculoskeletal:        General: Normal range of motion.     Cervical back: Normal range of motion and neck supple.  Skin:    General: Skin is warm and dry.     Capillary Refill: Capillary refill takes less than 2  seconds.     Coloration: Skin is not jaundiced or pale.     Findings: No bruising, erythema, lesion or rash.  Neurological:     General: No focal deficit present.     Mental Status: She is alert and oriented to person, place, and time. Mental status is at baseline.  Psychiatric:        Mood and Affect: Mood normal.        Behavior: Behavior normal.        Thought Content: Thought content normal.        Judgment: Judgment normal.    Results for orders placed or performed in visit on 07/01/21  Microscopic Examination   BLD  Result Value Ref Range   WBC, UA 0-5 0 - 5 /hpf   RBC, Urine None seen 0 - 2 /hpf   Epithelial Cells (non renal) 0-10 0 - 10 /hpf   Mucus, UA Present (A) Not Estab.   Bacteria, UA Moderate (A) None seen/Few  CBC with Differential/Platelet  Result Value Ref Range   WBC 3.5 3.4 - 10.8 x10E3/uL   RBC 4.85 3.77 - 5.28 x10E6/uL   Hemoglobin 12.9 11.1 - 15.9 g/dL   Hematocrit 39.3 34.0 - 46.6 %   MCV 81 79 - 97 fL   MCH 26.6 26.6 - 33.0 pg   MCHC 32.8 31.5 - 35.7 g/dL   RDW 13.3 11.7 - 15.4 %   Platelets 228 150 - 450 x10E3/uL   Neutrophils 54 Not Estab. %   Lymphs 38 Not Estab. %   Monocytes 7 Not Estab. %  Eos 1 Not Estab. %   Basos 0 Not Estab. %   Neutrophils Absolute 1.9 1.4 - 7.0 x10E3/uL   Lymphocytes Absolute 1.3 0.7 - 3.1 x10E3/uL   Monocytes Absolute 0.3 0.1 - 0.9 x10E3/uL   EOS (ABSOLUTE) 0.0 0.0 - 0.4 x10E3/uL   Basophils Absolute 0.0 0.0 - 0.2 x10E3/uL   Immature Granulocytes 0 Not Estab. %   Immature Grans (Abs) 0.0 0.0 - 0.1 x10E3/uL  Comprehensive metabolic panel  Result Value Ref Range   Glucose 90 70 - 99 mg/dL   BUN 11 6 - 20 mg/dL   Creatinine, Ser 0.74 0.57 - 1.00 mg/dL   eGFR 109 >59 mL/min/1.73   BUN/Creatinine Ratio 15 9 - 23   Sodium 140 134 - 144 mmol/L   Potassium 4.4 3.5 - 5.2 mmol/L   Chloride 102 96 - 106 mmol/L   CO2 21 20 - 29 mmol/L   Calcium 9.2 8.7 - 10.2 mg/dL   Total Protein 7.4 6.0 - 8.5 g/dL   Albumin 4.7  3.8 - 4.8 g/dL   Globulin, Total 2.7 1.5 - 4.5 g/dL   Albumin/Globulin Ratio 1.7 1.2 - 2.2   Bilirubin Total 0.2 0.0 - 1.2 mg/dL   Alkaline Phosphatase 55 44 - 121 IU/L   AST 26 0 - 40 IU/L   ALT 10 0 - 32 IU/L  Lipid Panel w/o Chol/HDL Ratio  Result Value Ref Range   Cholesterol, Total 184 100 - 199 mg/dL   Triglycerides 170 (H) 0 - 149 mg/dL   HDL 43 >39 mg/dL   VLDL Cholesterol Cal 30 5 - 40 mg/dL   LDL Chol Calc (NIH) 111 (H) 0 - 99 mg/dL  Urinalysis, Routine w reflex microscopic  Result Value Ref Range   Specific Gravity, UA 1.015 1.005 - 1.030   pH, UA 7.0 5.0 - 7.5   Color, UA Yellow Yellow   Appearance Ur Cloudy (A) Clear   Leukocytes,UA 1+ (A) Negative   Protein,UA Negative Negative/Trace   Glucose, UA Negative Negative   Ketones, UA Negative Negative   RBC, UA Negative Negative   Bilirubin, UA Negative Negative   Urobilinogen, Ur 0.2 0.2 - 1.0 mg/dL   Nitrite, UA Negative Negative   Microscopic Examination See below:   TSH  Result Value Ref Range   TSH 2.770 0.450 - 4.500 uIU/mL  Bayer DCA Hb A1c Waived  Result Value Ref Range   HB A1C (BAYER DCA - WAIVED) 5.3 4.8 - 5.6 %  Ehrlichia Antibody Panel  Result Value Ref Range   E.Chaffeensis (HME) IgG Negative Neg:<1:64   E. Chaffeensis (HME) IgM Titer Negative Neg:<1:20   HGE IgG Titer Negative Neg:<1:64   HGE IgM Titer Negative Neg:<1:20  Babesia microti Antibody Panel  Result Value Ref Range   Babesia microti IgM <1:10 Neg:<1:10   Babesia microti IgG <1:10 Neg:<1:10  Rocky mtn spotted fvr abs pnl(IgG+IgM)  Result Value Ref Range   RMSF IgG Negative Negative   RMSF IgM 0.90 (H) 0.00 - 0.89 index  Lyme Disease Serology w/Reflex  Result Value Ref Range   Lyme Total Antibody EIA Negative Negative      Assessment & Plan:   Problem List Items Addressed This Visit   None    Follow up plan: No follow-ups on file.

## 2021-09-24 ENCOUNTER — Encounter: Payer: Self-pay | Admitting: Family Medicine

## 2021-09-24 MED ORDER — NORETHIN ACE-ETH ESTRAD-FE 1-20 MG-MCG PO TABS
1.0000 | ORAL_TABLET | Freq: Every day | ORAL | 4 refills | Status: DC
Start: 2021-09-24 — End: 2022-08-31

## 2021-09-24 MED ORDER — HYDROCORTISONE 0.5 % EX CREA: 1.0000 | TOPICAL_CREAM | Freq: Two times a day (BID) | CUTANEOUS | 0 refills | Status: DC

## 2021-09-24 MED ORDER — NORTRIPTYLINE HCL 10 MG PO CAPS: 10.0000 mg | ORAL_CAPSULE | Freq: Every day | ORAL | 3 refills | Status: DC

## 2021-09-24 NOTE — Assessment & Plan Note (Signed)
Given changes in her migraines with the dizziness, will get her set up for MRI for evaluation and to r/o vestibular neuroma. Await results. Will start nortriptyline and recheck 1 month. Call with any concerns.

## 2021-10-14 ENCOUNTER — Ambulatory Visit
Admission: RE | Admit: 2021-10-14 | Discharge: 2021-10-14 | Disposition: A | Discharge status: Home / Self Care | Payer: Managed Care, Other (non HMO) | Source: Ambulatory Visit | Attending: Family Medicine | Admitting: Family Medicine

## 2021-10-14 DIAGNOSIS — R42 Dizziness and giddiness: Secondary | ICD-10-CM

## 2021-10-14 DIAGNOSIS — G43809 Other migraine, not intractable, without status migrainosus: Secondary | ICD-10-CM

## 2021-10-14 MED ORDER — GADOBENATE DIMEGLUMINE 529 MG/ML IV SOLN
12.0000 mL | Freq: Once | INTRAVENOUS | Status: AC | PRN
  Administered 2021-10-14: 12 mL via INTRAVENOUS

## 2021-11-03 ENCOUNTER — Telehealth (INDEPENDENT_AMBULATORY_CARE_PROVIDER_SITE_OTHER): Payer: Managed Care, Other (non HMO) | Admitting: Family Medicine

## 2021-11-03 ENCOUNTER — Encounter: Payer: Self-pay | Admitting: Family Medicine

## 2021-11-03 DIAGNOSIS — Z3041 Encounter for surveillance of contraceptive pills: Secondary | ICD-10-CM | POA: Diagnosis not present

## 2021-11-03 DIAGNOSIS — G43809 Other migraine, not intractable, without status migrainosus: Secondary | ICD-10-CM

## 2021-11-03 DIAGNOSIS — F3341 Major depressive disorder, recurrent, in partial remission: Secondary | ICD-10-CM | POA: Diagnosis not present

## 2021-11-03 MED ORDER — NORTRIPTYLINE HCL 10 MG PO CAPS: 10.0000 mg | ORAL_CAPSULE | Freq: Every day | ORAL | 1 refills | Status: DC

## 2021-11-03 MED ORDER — SERTRALINE HCL 100 MG PO TABS: 100.0000 mg | ORAL_TABLET | Freq: Every day | ORAL | 1 refills | Status: DC

## 2021-11-03 NOTE — Assessment & Plan Note (Signed)
Doing much better on the nortriptyline. Continue current regimen. Continue to monitor. Call with any concerns. Refills given.

## 2021-11-03 NOTE — Progress Notes (Signed)
There were no vitals taken for this visit.   Subjective:    Patient ID: Angela [name], female    DOB: 1985/08/15, 36 y.o.   MRN: [id number]  HPI: Angela [name] is a 36 y.o. female  Chief Complaint  Patient presents with   Migraine    Patient states she is not taking nortriptyline daily but has had less migraines.    Encounter for initial prescription of contraceptive pills     Patient states new BC is working well    CONTRACEPTION CONCERNS Contraception:  Previous contraception:  Sexual activity:  Gravida/Para:  Menarche at age:  Average interval between menses: 28 Length of menses:  Flow: Dysmenorrhea:  Taking the nortirptyline when she remembers. Has only had 1 migraine since starting it. Feeling much better.   DEPRESSION Mood status: controlled Satisfied with current treatment?: yes Symptom severity: mild  Duration of current treatment : chronic Side effects: no Medication compliance: excellent compliance Psychotherapy/counseling: no  Previous psychiatric medications: sertraline Depressed mood: no Anxious mood: no Anhedonia: no Significant weight loss or gain: no Insomnia: no  Fatigue: no Feelings of worthlessness or guilt: no Impaired concentration/indecisiveness: no Suicidal ideations: no Hopelessness: no Crying spells: no    11/03/2021   10:07 AM 09/22/2021   10:04 AM 07/01/2021    4:31 PM 02/19/2021    1:54 PM 06/08/2020   11:04 AM  Depression screen PHQ 2/9  Decreased Interest 0 0 0 0 0  Down, Depressed, Hopeless 0 0 0 0 1  PHQ - 2 Score 0 0 0 0 1  Altered sleeping 0 0 0 0 0  Tired, decreased energy 0 2 3 0 0  Change in appetite 1 0 0 0 0  Feeling bad or failure about yourself  0 1 1 0 0  Trouble concentrating 3 0 _0 Moving slowly or fidgety/restless 0 0 1 0 0  Suicidal thoughts 0 0 0 0 0  PHQ-9 Score _1 Difficult doing work/chores  Not difficult at all  Not difficult at all Not difficult at all     Relevant past medical,  surgical, family and social history reviewed and updated as indicated. Interim medical history since our last visit reviewed. Allergies and medications reviewed and updated.  Review of Systems  Constitutional: Negative.   Respiratory: Negative.    Cardiovascular: Negative.   Gastrointestinal: Negative.   Musculoskeletal: Negative.   Neurological: Negative.   Psychiatric/Behavioral: Negative.      Per HPI unless specifically indicated above     Objective:    There were no vitals taken for this visit.  Wt Readings from Last 3 Encounters:  09/22/21 133 lb 11.2 oz (60.6 kg)  07/01/21 132 lb (59.9 kg)  03/18/21 131 lb 3.2 oz (59.5 kg)    Physical Exam Vitals and nursing note reviewed.  Constitutional:      General: She is not in acute distress.    Appearance: Normal appearance. She is normal weight. She is not ill-appearing, toxic-appearing or diaphoretic.  HENT:     Head: Normocephalic and atraumatic.     Right Ear: External ear normal.     Left Ear: External ear normal.     Nose: Nose normal.     Mouth/Throat:     Mouth: Mucous membranes are moist.     Pharynx: Oropharynx is clear.  Eyes:     General: No scleral icterus.       Right eye: No discharge.  Left eye: No discharge.     Conjunctiva/sclera: Conjunctivae normal.     Pupils: Pupils are equal, round, and reactive to light.  Pulmonary:     Effort: Pulmonary effort is normal. No respiratory distress.     Comments: Speaking in full sentences Musculoskeletal:        General: Normal range of motion.     Cervical back: Normal range of motion.  Skin:    Coloration: Skin is not jaundiced or pale.     Findings: No bruising, erythema, lesion or rash.  Neurological:     Mental Status: She is alert and oriented to person, place, and time. Mental status is at baseline.  Psychiatric:        Mood and Affect: Mood normal.        Behavior: Behavior normal.        Thought Content: Thought content normal.         Judgment: Judgment normal.     Results for orders placed or performed in visit on 07/01/21  Microscopic Examination   BLD  Result Value Ref Range   WBC, UA 0-5 0 - 5 /hpf   RBC, Urine None seen 0 - 2 /hpf   Epithelial Cells (non renal) 0-10 0 - 10 /hpf   Mucus, UA Present (A) Not Estab.   Bacteria, UA Moderate (A) None seen/Few  CBC with Differential/Platelet  Result Value Ref Range   WBC 3.5 3.4 - 10.8 x10E3/uL   RBC 4.85 3.77 - 5.28 x10E6/uL   Hemoglobin 12.9 11.1 - 15.9 g/dL   Hematocrit 39.3 34.0 - 46.6 %   MCV 81 79 - 97 fL   MCH 26.6 26.6 - 33.0 pg   MCHC 32.8 31.5 - 35.7 g/dL   RDW 13.3 11.7 - 15.4 %   Platelets 228 150 - 450 x10E3/uL   Neutrophils 54 Not Estab. %   Lymphs 38 Not Estab. %   Monocytes 7 Not Estab. %   Eos 1 Not Estab. %   Basos 0 Not Estab. %   Neutrophils Absolute 1.9 1.4 - 7.0 x10E3/uL   Lymphocytes Absolute 1.3 0.7 - 3.1 x10E3/uL   Monocytes Absolute 0.3 0.1 - 0.9 x10E3/uL   EOS (ABSOLUTE) 0.0 0.0 - 0.4 x10E3/uL   Basophils Absolute 0.0 0.0 - 0.2 x10E3/uL   Immature Granulocytes 0 Not Estab. %   Immature Grans (Abs) 0.0 0.0 - 0.1 x10E3/uL  Comprehensive metabolic panel  Result Value Ref Range   Glucose 90 70 - 99 mg/dL   BUN 11 6 - 20 mg/dL   Creatinine, Ser 0.74 0.57 - 1.00 mg/dL   eGFR 109 >59 mL/min/1.73   BUN/Creatinine Ratio 15 9 - 23   Sodium 140 134 - 144 mmol/L   Potassium 4.4 3.5 - 5.2 mmol/L   Chloride 102 96 - 106 mmol/L   CO2 21 20 - 29 mmol/L   Calcium 9.2 8.7 - 10.2 mg/dL   Total Protein 7.4 6.0 - 8.5 g/dL   Albumin 4.7 3.8 - 4.8 g/dL   Globulin, Total 2.7 1.5 - 4.5 g/dL   Albumin/Globulin Ratio 1.7 1.2 - 2.2   Bilirubin Total 0.2 0.0 - 1.2 mg/dL   Alkaline Phosphatase 55 44 - 121 IU/L   AST 26 0 - 40 IU/L   ALT 10 0 - 32 IU/L  Lipid Panel w/o Chol/HDL Ratio  Result Value Ref Range   Cholesterol, Total 184 100 - 199 mg/dL   Triglycerides 170 (H) 0 - 149 mg/dL  HDL 43 >39 mg/dL   VLDL Cholesterol Cal 30 5 - 40 mg/dL    LDL Chol Calc (NIH) 111 (H) 0 - 99 mg/dL  Urinalysis, Routine w reflex microscopic  Result Value Ref Range   Specific Gravity, UA 1.015 1.005 - 1.030   pH, UA 7.0 5.0 - 7.5   Color, UA Yellow Yellow   Appearance Ur Cloudy (A) Clear   Leukocytes,UA 1+ (A) Negative   Protein,UA Negative Negative/Trace   Glucose, UA Negative Negative   Ketones, UA Negative Negative   RBC, UA Negative Negative   Bilirubin, UA Negative Negative   Urobilinogen, Ur 0.2 0.2 - 1.0 mg/dL   Nitrite, UA Negative Negative   Microscopic Examination See below:   TSH  Result Value Ref Range   TSH 2.770 0.450 - 4.500 uIU/mL  Bayer DCA Hb A1c Waived  Result Value Ref Range   HB A1C (BAYER DCA - WAIVED) 5.3 4.8 - 5.6 %  Ehrlichia Antibody Panel  Result Value Ref Range   E.Chaffeensis (HME) IgG Negative Neg:<1:64   E. Chaffeensis (HME) IgM Titer Negative Neg:<1:20   HGE IgG Titer Negative Neg:<1:64   HGE IgM Titer Negative Neg:<1:20  Babesia microti Antibody Panel  Result Value Ref Range   Babesia microti IgM <1:10 Neg:<1:10   Babesia microti IgG <1:10 Neg:<1:10  Rocky mtn spotted fvr abs pnl(IgG+IgM)  Result Value Ref Range   RMSF IgG Negative Negative   RMSF IgM 0.90 (H) 0.00 - 0.89 index  Lyme Disease Serology w/Reflex  Result Value Ref Range   Lyme Total Antibody EIA Negative Negative      Assessment & Plan:   Problem List Items Addressed This Visit       Cardiovascular and Mediastinum   Migraine - Primary    Doing much better on the nortriptyline. Continue current regimen. Continue to monitor. Call with any concerns. Refills given.       Relevant Medications   sertraline (ZOLOFT) 100 MG tablet   nortriptyline (PAMELOR) 10 MG capsule     Other   Recurrent major depressive disorder, in partial remission ([place])    Under good control on current regimen. Continue current regimen. Continue to monitor. Call with any concerns. Refills given.        Relevant Medications   sertraline (ZOLOFT)  100 MG tablet   nortriptyline (PAMELOR) 10 MG capsule   Other Visit Diagnoses     Encounter for surveillance of contraceptive pills       Doing much better on change in estrogen. Continue current regimen. Continue to monitor. Call with any concerns. Refills given.         Follow up plan: Return in about 6 months (around 05/04/2022) for physical.    This visit was completed via video visit through MyChart due to the restrictions of the COVID-19 pandemic. All issues as above were discussed and addressed. Physical exam was done as above through visual confirmation on video through MyChart. If it was felt that the patient should be evaluated in the office, they were directed there. The patient verbally consented to this visit. Location of the patient: home Location of the provider: work Those involved with this call:  Provider: Park Liter, DO CMA: Louanna Raw, [place] Desk/Registration: [employer]  Time spent on call:  25 minutes with patient face to face via video conference. More than 50% of this time was spent in counseling and coordination of care. 40 minutes total spent in review of patient's record and preparation  of their chart.

## 2021-11-03 NOTE — Assessment & Plan Note (Signed)
Under good control on current regimen. Continue current regimen. Continue to monitor. Call with any concerns. Refills given.   

## 2021-11-03 NOTE — Progress Notes (Signed)
LVM asking patient to call back to schedule follow up appointment  ?

## 2022-04-11 ENCOUNTER — Encounter: Payer: Self-pay | Admitting: Family Medicine

## 2022-05-03 ENCOUNTER — Ambulatory Visit: Payer: Self-pay | Admitting: Family Medicine

## 2022-05-24 ENCOUNTER — Ambulatory Visit: Payer: Self-pay | Admitting: Family Medicine

## 2022-07-08 ENCOUNTER — Other Ambulatory Visit: Payer: Self-pay | Admitting: Family Medicine

## 2022-07-08 NOTE — Telephone Encounter (Signed)
Requested medications are due for refill today.  yes  Requested medications are on the active medications list.  yes  Last refill. 11/03/2021 #90 1 rf  Future visit scheduled.   no  Notes to clinic.  Labs are expired. Pt needs an appointment.    Requested Prescriptions  Pending Prescriptions Disp Refills   sertraline (ZOLOFT) 100 MG tablet [Pharmacy Med Name: sertraline 100 mg tablet] 90 tablet 1    Sig: TAKE ONE TABLET BY MOUTH ONCE DAILY     Psychiatry:  Antidepressants - SSRI - sertraline Failed - 07/08/2022  8:00 AM      Failed - AST in normal range and within 360 days    AST  Date Value Ref Range Status  07/01/2021 26 0 - 40 IU/L Final   SGOT(AST)  Date Value Ref Range Status  10/09/2011 13 (L) 15 - 37 Unit/L Final         Failed - ALT in normal range and within 360 days    ALT  Date Value Ref Range Status  07/01/2021 10 0 - 32 IU/L Final   SGPT (ALT)  Date Value Ref Range Status  10/09/2011 17 12 - 78 U/L Final         Failed - Valid encounter within last 6 months    Recent Outpatient Visits           8 months ago Other migraine without status migrainosus, not intractable   [facility] [place], [name], DO   9 months ago Other migraine without status migrainosus, not intractable   [place] [facility] [place], [name], DO   1 year ago Chronic tension-type headache, not intractable   [place] [facility] [place], [name], DO   1 year ago Recurrent major depressive disorder, in partial remission [facility])   [place] [place], [name], DO   2 years ago Recurrent major depressive disorder, in partial remission (HCC)    [facility] [place], [name], DO              Passed - Completed PHQ-2 or PHQ-9 in the last 360 days

## 2022-07-25 ENCOUNTER — Encounter: Payer: Self-pay | Admitting: Family Medicine

## 2022-07-25 ENCOUNTER — Other Ambulatory Visit: Payer: Self-pay | Admitting: Family Medicine

## 2022-07-26 ENCOUNTER — Other Ambulatory Visit: Payer: Self-pay | Admitting: Family Medicine

## 2022-07-26 MED ORDER — SERTRALINE HCL 100 MG PO TABS: 100.0000 mg | ORAL_TABLET | Freq: Every day | ORAL | 1 refills | Status: DC

## 2022-07-26 NOTE — Telephone Encounter (Signed)
Called and scheduled appointment on 09/27/2022 @ 9:40 am.

## 2022-07-26 NOTE — Telephone Encounter (Signed)
Rx sent- please get her scheduled for physical in next 2 months.

## 2022-08-30 ENCOUNTER — Encounter: Payer: Self-pay | Admitting: Family Medicine

## 2022-08-31 MED ORDER — NORETHIN ACE-ETH ESTRAD-FE 1-20 MG-MCG PO TABS: 1.0000 | ORAL_TABLET | Freq: Every day | ORAL | 4 refills | Status: DC

## 2022-09-18 ENCOUNTER — Other Ambulatory Visit: Payer: Self-pay | Admitting: Family Medicine

## 2022-09-19 NOTE — Telephone Encounter (Signed)
Requested medications are due for refill today.  yes  Requested medications are on the active medications list.  yes  Last refill. 07/26/2022 #30 1 rf  Future visit scheduled.   yes  Notes to clinic.  Labs are expired. Pt is more than 3 months overdue for OV.    Requested Prescriptions  Pending Prescriptions Disp Refills   sertraline (ZOLOFT) 100 MG tablet [Pharmacy Med Name: sertraline 100 mg tablet] 30 tablet 1    Sig: TAKE ONE TABLET BY MOUTH EVERY MORNING     Psychiatry:  Antidepressants - SSRI - sertraline Failed - 09/18/2022  8:00 AM      Failed - AST in normal range and within 360 days    AST  Date Value Ref Range Status  07/01/2021 26 0 - 40 IU/L Final   SGOT(AST)  Date Value Ref Range Status  10/09/2011 13 (L) 15 - 37 Unit/L Final         Failed - ALT in normal range and within 360 days    ALT  Date Value Ref Range Status  07/01/2021 10 0 - 32 IU/L Final   SGPT (ALT)  Date Value Ref Range Status  10/09/2011 17 12 - 78 U/L Final         Failed - Valid encounter within last 6 months    Recent Outpatient Visits           10 months ago Other migraine without status migrainosus, not intractable   [facility] [place], Angela P, DO   12 months ago Other migraine without status migrainosus, not intractable   [facility] [place], Angela P, DO   1 year ago Chronic tension-type headache, not intractable   [facility] [place], Angela P, DO   1 year ago Recurrent major depressive disorder, in partial remission [facility])   [place] [facility] [place], Angela P, DO   2 years ago Recurrent major depressive disorder, in partial remission [facility])   [place] [facility] [place], Angela Rud, DO       Future Appointments             In 1 week Angela [name], Angela Rud, DO  [facility]            Passed - Completed PHQ-2 or PHQ-9 in the last 360 days

## 2022-09-20 ENCOUNTER — Other Ambulatory Visit: Payer: Self-pay | Admitting: Family Medicine

## 2022-09-21 NOTE — Telephone Encounter (Signed)
OV scheduled 09/27/22.  Requested Prescriptions  Pending Prescriptions Disp Refills   sertraline (ZOLOFT) 100 MG tablet [Pharmacy Med Name: sertraline 100 mg tablet] 90 tablet 0    Sig: TAKE ONE TABLET BY MOUTH EVERY MORNING     Psychiatry:  Antidepressants - SSRI - sertraline Failed - 09/20/2022  9:45 AM      Failed - AST in normal range and within 360 days    AST  Date Value Ref Range Status  07/01/2021 26 0 - 40 IU/L Final   SGOT(AST)  Date Value Ref Range Status  10/09/2011 13 (L) 15 - 37 Unit/L Final         Failed - ALT in normal range and within 360 days    ALT  Date Value Ref Range Status  07/01/2021 10 0 - 32 IU/L Final   SGPT (ALT)  Date Value Ref Range Status  10/09/2011 17 12 - 78 U/L Final         Failed - Valid encounter within last 6 months    Recent Outpatient Visits           10 months ago Other migraine without status migrainosus, not intractable   [place] [facility] [place], [name], DO   12 months ago Other migraine without status migrainosus, not intractable   [place] [facility] [place], [name], DO   1 year ago Chronic tension-type headache, not intractable   [place] [facility] [place], [name], DO   1 year ago Recurrent major depressive disorder, in partial remission [facility])   [place] [facility] [place], [name], DO   2 years ago Recurrent major depressive disorder, in partial remission [facility])   [place] [facility] [place], [name], DO       Future Appointments             In 6 days Dorcas Carrow, DO [place] [facility]            Passed - Completed PHQ-2 or PHQ-9 in the last 360 days

## 2022-09-27 ENCOUNTER — Encounter: Payer: Self-pay | Admitting: Family Medicine

## 2022-11-28 ENCOUNTER — Encounter: Payer: Self-pay | Admitting: Family Medicine

## 2022-11-28 NOTE — Telephone Encounter (Signed)
Pt has an appointment on 11/29/2022 @ 4pm.

## 2022-11-29 ENCOUNTER — Encounter: Payer: Self-pay | Admitting: Family Medicine

## 2022-11-29 ENCOUNTER — Ambulatory Visit (INDEPENDENT_AMBULATORY_CARE_PROVIDER_SITE_OTHER): Payer: 59 | Admitting: Family Medicine

## 2022-11-29 VITALS — BP 123/82 | HR 88 | Wt 129.8 lb

## 2022-11-29 DIAGNOSIS — N912 Amenorrhea, unspecified: Secondary | ICD-10-CM

## 2022-11-29 DIAGNOSIS — F3341 Major depressive disorder, recurrent, in partial remission: Secondary | ICD-10-CM

## 2022-11-29 LAB — PREGNANCY, URINE: Preg Test, Ur: NEGATIVE

## 2022-11-29 MED ORDER — SERTRALINE HCL 100 MG PO TABS: 100.0000 mg | ORAL_TABLET | Freq: Every morning | ORAL | 1 refills | Status: DC

## 2022-11-29 MED ORDER — NORTRIPTYLINE HCL 10 MG PO CAPS: 10.0000 mg | ORAL_CAPSULE | Freq: Every day | ORAL | 1 refills | Status: DC

## 2022-11-29 NOTE — Progress Notes (Signed)
BP 123/82   Pulse 88   Wt 129 lb 12.8 oz (58.9 kg)   LMP 10/24/2022 (Approximate)   SpO2 99%   BMI 23.74 kg/m    Subjective:    Patient ID: Angela [name], female    DOB: [birth year], 37 y.o.   MRN: [id number]  HPI: Angela [name] is a 37 y.o. female  Chief Complaint  Patient presents with   Amenorrhea    Patient says she takes birth control pills. Patient says she skipped the placebo week and never got her cycle. Patient says she was supposed to restarted her new pack yesterday, but did not take the medication. Patient says she noticed pain with intercourse and has noticed some lower abdominal pain. Patient says she took a pregnancy test Thursday and it was negative.    ABNORMAL MENSTRUAL PERIODS G3P2012 Duration: 1 week Average interval between menses: 28-31 days Length of menses:  Flow: spotting only Dysmenorrhea: yes Intermenstrual bleeding:no Postcoital bleeding: no Contraception: OCP Sexual activity: In a Monogamous Relationship History of sexually transmitted diseases: no History GYN procedures: no Abnormal pap smears: no   Dyspareunia: yes Vaginal discharge:yes Abdominal pain: yes Galactorrhea: no Hirsuitism: no Frequent bruising/mucosal bleeding: no Double vision:no Hot flashes: no  DEPRESSION Mood status: controlled Satisfied with current treatment?: yes Symptom severity: mild  Duration of current treatment : chronic Side effects: no Medication compliance: excellent compliance Psychotherapy/counseling: no  Previous psychiatric medications: sertraline Depressed mood: no Anxious mood: no Anhedonia: no Significant weight loss or gain: no Insomnia: no  Fatigue: yes Feelings of worthlessness or guilt: no Impaired concentration/indecisiveness: no Suicidal ideations: no Hopelessness: no Crying spells: no    11/29/2022    4:18 PM 11/03/2021   10:07 AM 09/22/2021   10:04 AM 07/01/2021    4:31 PM 02/19/2021    1:54 PM  Depression screen PHQ 2/9   Decreased Interest 0 0 0 0 0  Down, Depressed, Hopeless 0 0 0 0 0  PHQ - 2 Score 0 0 0 0 0  Altered sleeping 0 0 0 0 0  Tired, decreased energy 1 0 2 3 0  Change in appetite 0 1 0 0 0  Feeling bad or failure about yourself  0 0 1 1 0  Trouble concentrating 0 3 0 1 3  Moving slowly or fidgety/restless 0 0 0 1 0  Suicidal thoughts 0 0 0 0 0  PHQ-9 Score 1 4 3 6 3   Difficult doing work/chores Not difficult at all  Not difficult at all  Not difficult at all    Relevant past medical, surgical, family and social history reviewed and updated as indicated. Interim medical history since our last visit reviewed. Allergies and medications reviewed and updated.  Review of Systems  Constitutional: Negative.   Respiratory: Negative.    Cardiovascular: Negative.   Musculoskeletal: Negative.   Psychiatric/Behavioral: Negative.      Per HPI unless specifically indicated above     Objective:    BP 123/82   Pulse 88   Wt 129 lb 12.8 oz (58.9 kg)   LMP 10/24/2022 (Approximate)   SpO2 99%   BMI 23.74 kg/m   Wt Readings from Last 3 Encounters:  11/29/22 129 lb 12.8 oz (58.9 kg)  09/22/21 133 lb 11.2 oz (60.6 kg)  07/01/21 132 lb (59.9 kg)    Physical Exam Vitals and nursing note reviewed.  Constitutional:      General: She is not in acute distress.    Appearance: Normal appearance.  She is normal weight. She is not ill-appearing, toxic-appearing or diaphoretic.  HENT:     Head: Normocephalic and atraumatic.     Right Ear: External ear normal.     Left Ear: External ear normal.     Nose: Nose normal.     Mouth/Throat:     Mouth: Mucous membranes are moist.     Pharynx: Oropharynx is clear.  Eyes:     General: No scleral icterus.       Right eye: No discharge.        Left eye: No discharge.     Extraocular Movements: Extraocular movements intact.     Conjunctiva/sclera: Conjunctivae normal.     Pupils: Pupils are equal, round, and reactive to light.  Cardiovascular:     Rate  and Rhythm: Normal rate and regular rhythm.     Pulses: Normal pulses.     Heart sounds: Normal heart sounds. No murmur heard.    No friction rub. No gallop.  Pulmonary:     Effort: Pulmonary effort is normal. No respiratory distress.     Breath sounds: Normal breath sounds. No stridor. No wheezing, rhonchi or rales.  Chest:     Chest wall: No tenderness.  Musculoskeletal:        General: Normal range of motion.     Cervical back: Normal range of motion and neck supple.  Skin:    General: Skin is warm and dry.     Capillary Refill: Capillary refill takes less than 2 seconds.     Coloration: Skin is not jaundiced or pale.     Findings: No bruising, erythema, lesion or rash.  Neurological:     General: No focal deficit present.     Mental Status: She is alert and oriented to person, place, and time. Mental status is at baseline.  Psychiatric:        Mood and Affect: Mood normal.        Behavior: Behavior normal.        Thought Content: Thought content normal.        Judgment: Judgment normal.     Results for orders placed or performed in visit on 11/29/22  Pregnancy, urine  Result Value Ref Range   Preg Test, Ur Negative Negative      Assessment & Plan:   Problem List Items Addressed This Visit       Other   Recurrent major depressive disorder, in partial remission (HCC)    Under good control on current regimen. Continue current regimen. Continue to monitor. Call with any concerns. Refills given.        Relevant Medications   sertraline (ZOLOFT) 100 MG tablet   nortriptyline (PAMELOR) 10 MG capsule   Other Visit Diagnoses     Amenorrhea    -  Primary   Pregnancy negative. Continue to monitor, if doesnt' have normal period next month will obtain US.   Relevant Orders   Pregnancy, urine (Completed)        Follow up plan: Return in about 6 months (around 05/30/2023) for physical.

## 2022-11-29 NOTE — Assessment & Plan Note (Signed)
Under good control on current regimen. Continue current regimen. Continue to monitor. Call with any concerns. Refills given.   

## 2023-01-17 ENCOUNTER — Encounter: Payer: Self-pay | Admitting: Family Medicine

## 2023-01-18 NOTE — Telephone Encounter (Signed)
Called and scheduled 01/20/2023 @ 4:00 pm.

## 2023-01-18 NOTE — Telephone Encounter (Signed)
Appt, virtual OK 

## 2023-01-20 ENCOUNTER — Telehealth (INDEPENDENT_AMBULATORY_CARE_PROVIDER_SITE_OTHER): Payer: 59 | Admitting: Family Medicine

## 2023-01-20 ENCOUNTER — Encounter: Payer: Self-pay | Admitting: Family Medicine

## 2023-01-20 DIAGNOSIS — N926 Irregular menstruation, unspecified: Secondary | ICD-10-CM | POA: Diagnosis not present

## 2023-01-20 MED ORDER — NORETHINDRONE ACET-ETHINYL EST 1.5-30 MG-MCG PO TABS: 1.0000 | ORAL_TABLET | Freq: Every day | ORAL | 3 refills | Status: DC

## 2023-01-20 NOTE — Progress Notes (Unsigned)
   LMP 01/17/2023 (Exact Date)    Subjective:    Patient ID: Angela [name], female    DOB: Apr 17, 1985, 37 y.o.   MRN: [id number]  HPI: Angela [name] is a 37 y.o. female  Chief Complaint  Patient presents with  . Menstrual Problem    Patient states she is has been having irregular periods. States she just finished a cycle the week before last and then started again this week.    ABNORMAL MENSTRUAL PERIODS G3P2012 Duration: about 3 weeks Average interval between menses: 28-31 days Length of menses: 5-7 days Flow: moderate Dysmenorrhea: yes Intermenstrual bleeding: since she had her son Postcoital bleeding: no Contraception: OCP Sexual activity: In a Monogamous Relationship History of sexually transmitted diseases: no History GYN procedures: no Abnormal pap smears: no   Dyspareunia: no Vaginal discharge:no Abdominal pain: no Galactorrhea: no Hirsuitism: no Frequent bruising/mucosal bleeding: no Double vision:no Hot flashes: no  Relevant past medical, surgical, family and social history reviewed and updated as indicated. Interim medical history since our last visit reviewed. Allergies and medications reviewed and updated.  Review of Systems  Per HPI unless specifically indicated above     Objective:    LMP 01/17/2023 (Exact Date)   Wt Readings from Last 3 Encounters:  11/29/22 129 lb 12.8 oz (58.9 kg)  09/22/21 133 lb 11.2 oz (60.6 kg)  07/01/21 132 lb (59.9 kg)    Physical Exam  Results for orders placed or performed in visit on 11/29/22  Pregnancy, urine  Result Value Ref Range   Preg Test, Ur Negative Negative      Assessment & Plan:   Problem List Items Addressed This Visit   None    Follow up plan: No follow-ups on file.

## 2023-01-23 ENCOUNTER — Encounter: Payer: Self-pay | Admitting: Family Medicine

## 2023-06-05 ENCOUNTER — Encounter: Payer: Self-pay | Admitting: Family Medicine

## 2023-06-19 ENCOUNTER — Other Ambulatory Visit: Payer: Self-pay | Admitting: Family Medicine

## 2023-06-20 NOTE — Telephone Encounter (Signed)
 Requested medications are due for refill today.  yes  Requested medications are on the active medications list.  yes  Last refill. Both 11/29/2022 #90 1 rf  Future visit scheduled.   no  Notes to clinic.  Labs are expired. Called pt to schedule ov - unable to [facility] mailbox is full.    Requested Prescriptions  Pending Prescriptions Disp Refills   sertraline  (ZOLOFT ) 100 MG tablet [Pharmacy Med Name: sertraline  100 mg tablet] 90 tablet 1    Sig: TAKE ONE TABLET BY MOUTH EVERY MORNING     Psychiatry:  Antidepressants - SSRI - sertraline  Failed - 06/20/2023  4:37 PM      Failed - AST in normal range and within 360 days    AST  Date Value Ref Range Status  07/01/2021 26 0 - 40 IU/L Final   SGOT(AST)  Date Value Ref Range Status  10/09/2011 13 (L) 15 - 37 Unit/L Final         Failed - ALT in normal range and within 360 days    ALT  Date Value Ref Range Status  07/01/2021 10 0 - 32 IU/L Final   SGPT (ALT)  Date Value Ref Range Status  10/09/2011 17 12 - 78 U/L Final         Failed - Valid encounter within last 6 months    Recent Outpatient Visits   None            Passed - Completed PHQ-2 or PHQ-9 in the last 360 days       nortriptyline  (PAMELOR ) 10 MG capsule [Pharmacy Med Name: nortriptyline  10 mg capsule] 90 capsule 1    Sig: TAKE ONE CAPSULE BY MOUTH AT BEDTIME     Psychiatry:  Antidepressants - Heterocyclics (TCAs) Failed - 06/20/2023  4:37 PM      Failed - Valid encounter within last 6 months    Recent Outpatient Visits   None            Passed - Completed PHQ-2 or PHQ-9 in the last 360 days

## 2023-06-20 NOTE — Telephone Encounter (Signed)
 Called pt to schedule appt. Call went to VM. Unable to lm as mailbox is full.

## 2023-08-03 ENCOUNTER — Other Ambulatory Visit: Payer: Self-pay | Admitting: Pediatrics

## 2023-08-04 NOTE — Telephone Encounter (Signed)
 Patient is overdue for an appointment. Please call and schedule appointment and then route to provider for possible refill.

## 2023-08-04 NOTE — Telephone Encounter (Signed)
 Requested medications are due for refill today.  yes  Requested medications are on the active medications list.  yes  Last refill. 06/22/2023 #30 0 rf  Future visit scheduled.   no  Notes to clinic.  Labs are expired. Pt is overdue for OV. Pt already given a courtesy refill.    Requested Prescriptions  Pending Prescriptions Disp Refills   sertraline  (ZOLOFT ) 100 MG tablet [Pharmacy Med Name: SERTRALINE  100MG  TABLETS] 30 tablet 0    Sig: TAKE 1 TABLET BY MOUTH EVERY MORNING     Psychiatry:  Antidepressants - SSRI - sertraline  Failed - 08/04/2023  3:54 PM      Failed - AST in normal range and within 360 days    AST  Date Value Ref Range Status  07/01/2021 26 0 - 40 IU/L Final   SGOT(AST)  Date Value Ref Range Status  10/09/2011 13 (L) 15 - 37 Unit/L Final         Failed - ALT in normal range and within 360 days    ALT  Date Value Ref Range Status  07/01/2021 10 0 - 32 IU/L Final   SGPT (ALT)  Date Value Ref Range Status  10/09/2011 17 12 - 78 U/L Final         Failed - Valid encounter within last 6 months    Recent Outpatient Visits   None            Passed - Completed PHQ-2 or PHQ-9 in the last 360 days

## 2023-08-09 ENCOUNTER — Other Ambulatory Visit: Payer: Self-pay | Admitting: Pediatrics

## 2023-08-09 MED ORDER — SERTRALINE HCL 100 MG PO TABS: 100.0000 mg | ORAL_TABLET | Freq: Every morning | ORAL | 0 refills | Status: DC

## 2023-08-10 NOTE — Telephone Encounter (Signed)
 Requested Prescriptions  Pending Prescriptions Disp Refills   sertraline  (ZOLOFT ) 100 MG tablet [Pharmacy Med Name: SERTRALINE  100MG  TABLETS] 30 tablet 0    Sig: TAKE 1 TABLET BY MOUTH EVERY MORNING     Psychiatry:  Antidepressants - SSRI - sertraline  Failed - 08/10/2023  5:28 PM      Failed - AST in normal range and within 360 days    AST  Date Value Ref Range Status  07/01/2021 26 0 - 40 IU/L Final   SGOT(AST)  Date Value Ref Range Status  10/09/2011 13 (L) 15 - 37 Unit/L Final         Failed - ALT in normal range and within 360 days    ALT  Date Value Ref Range Status  07/01/2021 10 0 - 32 IU/L Final   SGPT (ALT)  Date Value Ref Range Status  10/09/2011 17 12 - 78 U/L Final         Failed - Valid encounter within last 6 months    Recent Outpatient Visits   None            Passed - Completed PHQ-2 or PHQ-9 in the last 360 days

## 2023-08-21 NOTE — Telephone Encounter (Signed)
Scheduled for 7/22.

## 2023-08-29 NOTE — Telephone Encounter (Signed)
 Called patient and left a message for her to call back to get scheduled.

## 2023-08-29 NOTE — Telephone Encounter (Signed)
 Please assist patient in her request to change appointment time.   Thank you

## 2023-09-01 ENCOUNTER — Telehealth (INDEPENDENT_AMBULATORY_CARE_PROVIDER_SITE_OTHER): Admitting: Family Medicine

## 2023-09-01 VITALS — Ht 62.0 in | Wt 125.0 lb

## 2023-09-01 DIAGNOSIS — G43909 Migraine, unspecified, not intractable, without status migrainosus: Secondary | ICD-10-CM

## 2023-09-01 DIAGNOSIS — F3341 Major depressive disorder, recurrent, in partial remission: Secondary | ICD-10-CM

## 2023-09-01 DIAGNOSIS — G43809 Other migraine, not intractable, without status migrainosus: Secondary | ICD-10-CM

## 2023-09-01 DIAGNOSIS — R41840 Attention and concentration deficit: Secondary | ICD-10-CM | POA: Diagnosis not present

## 2023-09-01 MED ORDER — SERTRALINE HCL 100 MG PO TABS: 100.0000 mg | ORAL_TABLET | Freq: Every morning | ORAL | 1 refills | Status: DC

## 2023-09-01 MED ORDER — NORTRIPTYLINE HCL 10 MG PO CAPS: 10.0000 mg | ORAL_CAPSULE | Freq: Every day | ORAL | 1 refills | Status: DC

## 2023-09-01 MED ORDER — BUPROPION HCL ER (SR) 150 MG PO TB12: ORAL_TABLET | ORAL | 3 refills | Status: DC

## 2023-09-01 NOTE — Assessment & Plan Note (Signed)
 Doing OK but having issues with concentration. Unclear if ADD or mood related. Will start wellbutrin and get her in for neuropsych testing. Call with any concerns. Continue to monitor.

## 2023-09-01 NOTE — Progress Notes (Signed)
 Ht 5' 2 (1.575 m)   Wt 125 lb (56.7 kg)   BMI 22.86 kg/m    Subjective:    Patient ID: Angela [name], female    DOB: 1986-02-03, 38 y.o.   MRN: [id number]  HPI: Angela [name] is a 38 y.o. female  Chief Complaint  Patient presents with   concentration concern    Pt has concerns of of focus and attention issues. Unable to first one task before moving to the next. Easily distracted. Struggling with time management.    Depression   ANXIETY/DEPRESSION- having more trouble with focus. She thought it was more of a problem with being home with the kids, but it seems to be getting worse. She's having trouble focusing on 1 thing- she jumps between different things. Constantly feels like she's overwhelmed. Having issues with time management. She also feels very overstimulated. Duration: chronic Status:exacerbated Anxious mood: yes  Excessive worrying: yes Irritability: yes  Sweating: no Nausea: no Palpitations:no Hyperventilation: no Panic attacks: no Agoraphobia: no  Obscessions/compulsions: no Depressed mood: no    11/29/2022    4:18 PM 11/03/2021   10:07 AM 09/22/2021   10:04 AM 07/01/2021    4:31 PM 02/19/2021    1:54 PM  Depression screen PHQ 2/9  Decreased Interest 0 0 0 0 0  Down, Depressed, Hopeless 0 0 0 0 0  PHQ - 2 Score 0 0 0 0 0  Altered sleeping 0 0 0 0 0  Tired, decreased energy 1 0 2 3 0  Change in appetite 0 1 0 0 0  Feeling bad or failure about yourself  0 0 1 1 0  Trouble concentrating 0 3 0 1 3  Moving slowly or fidgety/restless 0 0 0 1 0  Suicidal thoughts 0 0 0 0 0  PHQ-9 Score 1 4 3 6 3   Difficult doing work/chores Not difficult at all  Not difficult at all  Not difficult at all      11/29/2022    4:18 PM 11/03/2021   10:09 AM 09/22/2021   10:05 AM 07/01/2021    4:32 PM  GAD 7 : Generalized Anxiety Score  Nervous, Anxious, on Edge 1 2 1 1   Control/stop worrying 0 0 0 0  Worry too much - different things 0 0 0 0  Trouble relaxing 1 2 1 1    Restless 1 3 1 1   Easily annoyed or irritable 1 1 1 1   Afraid - awful might happen 0 0 0 0  Total GAD 7 Score 4 8 4 4   Anxiety Difficulty Not difficult at all  Not difficult at all Not difficult at all   Anhedonia: no Weight changes: no Insomnia: no  Hypersomnia: no Fatigue/loss of energy: yes Feelings of worthlessness: no Feelings of guilt: no Impaired concentration/indecisiveness: no Suicidal ideations: no  Crying spells: no Recent Stressors/Life Changes: no   Relationship problems: no   Family stress: no     Financial stress: no    Job stress: no    Recent death/loss: no  Migraines happening right around her periods- taking her nortriptyline  around then.   Relevant past medical, surgical, family and social history reviewed and updated as indicated. Interim medical history since our last visit reviewed. Allergies and medications reviewed and updated.  Review of Systems  Constitutional: Negative.   Respiratory: Negative.    Cardiovascular: Negative.   Musculoskeletal: Negative.   Skin: Negative.   Neurological: Negative.   Psychiatric/Behavioral:  Positive for decreased concentration. Negative  for agitation, behavioral problems, confusion, dysphoric mood, hallucinations, self-injury, sleep disturbance and suicidal ideas. The patient is nervous/anxious and is hyperactive.     Per HPI unless specifically indicated above     Objective:    Ht 5' 2 (1.575 m)   Wt 125 lb (56.7 kg)   BMI 22.86 kg/m   Wt Readings from Last 3 Encounters:  09/01/23 125 lb (56.7 kg)  11/29/22 129 lb 12.8 oz (58.9 kg)  09/22/21 133 lb 11.2 oz (60.6 kg)    Physical Exam Vitals and nursing note reviewed.  Constitutional:      General: She is not in acute distress.    Appearance: Normal appearance. She is not ill-appearing, toxic-appearing or diaphoretic.  HENT:     Head: Normocephalic and atraumatic.     Right Ear: External ear normal.     Left Ear: External ear normal.     Nose:  Nose normal.     Mouth/Throat:     Mouth: Mucous membranes are moist.     Pharynx: Oropharynx is clear.  Eyes:     General: No scleral icterus.       Right eye: No discharge.        Left eye: No discharge.     Conjunctiva/sclera: Conjunctivae normal.     Pupils: Pupils are equal, round, and reactive to light.  Pulmonary:     Effort: Pulmonary effort is normal. No respiratory distress.     Comments: Speaking in full sentences Musculoskeletal:        General: Normal range of motion.     Cervical back: Normal range of motion.  Skin:    Coloration: Skin is not jaundiced or pale.     Findings: No bruising, erythema, lesion or rash.  Neurological:     Mental Status: She is alert and oriented to person, place, and time. Mental status is at baseline.  Psychiatric:        Mood and Affect: Mood normal.        Behavior: Behavior normal.        Thought Content: Thought content normal.        Judgment: Judgment normal.     Results for orders placed or performed in visit on 11/29/22  Pregnancy, urine   Collection Time: 11/29/22  4:18 PM  Result Value Ref Range   Preg Test, Ur Negative Negative      Assessment & Plan:   Problem List Items Addressed This Visit       Cardiovascular and Mediastinum   Migraine   Under good control on current regimen. Continue current regimen. Continue to monitor. Call with any concerns. Refills given.        Relevant Medications   nortriptyline  (PAMELOR ) 10 MG capsule   sertraline  (ZOLOFT ) 100 MG tablet   buPROPion (WELLBUTRIN SR) 150 MG 12 hr tablet     Other   Recurrent major depressive disorder, in partial remission (HCC) - Primary   Doing OK but having issues with concentration. Unclear if ADD or mood related. Will start wellbutrin and get her in for neuropsych testing. Call with any concerns. Continue to monitor.       Relevant Medications   nortriptyline  (PAMELOR ) 10 MG capsule   sertraline  (ZOLOFT ) 100 MG tablet   buPROPion (WELLBUTRIN  SR) 150 MG 12 hr tablet   Other Relevant Orders   Ambulatory referral to Neuropsychology   Other Visit Diagnoses       Concentration deficit  Unclear if ADD or mood related. Will start wellbutrin and get her in for neuropsych testing. Call with any concerns. Continue to monitor.   Relevant Orders   Ambulatory referral to Neuropsychology        Follow up plan: Return in about 6 weeks (around 10/13/2023) for physical.    This visit was completed via video visit through MyChart due to the restrictions of the COVID-19 pandemic. All issues as above were discussed and addressed. Physical exam was done as above through visual confirmation on video through MyChart. If it was felt that the patient should be evaluated in the office, they were directed there. The patient verbally consented to this visit. Location of the patient: work Location of the provider: work Those involved with this call:  Provider: Duwaine Louder, DO CMA: York Fogo, CMA, Front Desk/Registration: Claretta Maiden  Time spent on call: 15 minutes with patient face to face via video conference. More than 50% of this time was spent in counseling and coordination of care. 23 minutes total spent in review of patient's record and preparation of their chart.

## 2023-09-01 NOTE — Progress Notes (Signed)
 Appt scheduled

## 2023-09-01 NOTE — Assessment & Plan Note (Signed)
 Under good control on current regimen. Continue current regimen. Continue to monitor. Call with any concerns. Refills given.

## 2023-09-05 ENCOUNTER — Ambulatory Visit: Admitting: Family Medicine

## 2023-09-22 ENCOUNTER — Encounter: Payer: Self-pay | Admitting: Psychology

## 2023-10-07 ENCOUNTER — Other Ambulatory Visit: Payer: Self-pay

## 2023-10-07 ENCOUNTER — Ambulatory Visit
Admission: EM | Admit: 2023-10-07 | Discharge: 2023-10-07 | Disposition: A | Discharge status: Home / Self Care | Attending: Family Medicine | Admitting: Family Medicine

## 2023-10-07 ENCOUNTER — Encounter: Payer: Self-pay | Admitting: Emergency Medicine

## 2023-10-07 DIAGNOSIS — N390 Urinary tract infection, site not specified: Secondary | ICD-10-CM

## 2023-10-07 LAB — POCT URINE DIPSTICK
Bilirubin, UA: NEGATIVE
Glucose, UA: NEGATIVE mg/dL
Ketones, POC UA: NEGATIVE mg/dL
Nitrite, UA: NEGATIVE
Protein Ur, POC: NEGATIVE mg/dL
Spec Grav, UA: 1.015 (ref 1.010–1.025)
Urobilinogen, UA: 0.2 U/dL
pH, UA: 8 (ref 5.0–8.0)

## 2023-10-07 MED ORDER — CEPHALEXIN 500 MG PO CAPS: 500.0000 mg | ORAL_CAPSULE | Freq: Two times a day (BID) | ORAL | 0 refills | Status: DC

## 2023-10-07 NOTE — ED Triage Notes (Signed)
 Pt reports urinary frequency, urinary odor, dysuria since yesterday. Denies taking anything otc for symptoms. Denies fever, chills, nausea.

## 2023-10-07 NOTE — ED Provider Notes (Signed)
 [facility]    CSN: [id number] Arrival date & time: 10/07/23  1256      History   Chief Complaint Chief Complaint  Patient presents with   Urinary Frequency    HPI Angela [name] is a 38 y.o. female.   Patient presenting today with 1 day history of urinary frequency, dysuria, urine odor.  Denies fever, chills, nausea, vomiting, abdominal pain, hematuria, vaginal symptoms.  So far not trying anything over-the-counter for symptoms.  No concern for STIs or pregnancy.    Past Medical History:  Diagnosis Date   Bilateral carpal tunnel syndrome 11/18/2015   Cholestasis of pregnancy in third trimester 01/11/2017   Depression with anxiety 05/07/2014   GBS (group B streptococcus) infection 04/08/2019   PCN prophylaxis in labor   GERD (gastroesophageal reflux disease)    IBS (irritable bowel syndrome)    Migraines    Multiple thyroid  nodules    Painful menstrual periods 11/13/2014   Raynaud disease     Patient Active Problem List   Diagnosis Date Noted   Recurrent major depressive disorder, in partial remission (HCC) 12/09/2019   Stress incontinence of urine 03/15/2017   Gastroesophageal reflux disease without esophagitis 10/07/2016   Bilateral carpal tunnel syndrome 11/18/2015   Migraine    IBS (irritable bowel syndrome) 05/07/2014   Raynaud disease 05/07/2014    Past Surgical History:  Procedure Laterality Date   COLONOSCOPY  11/03/11   [place]:colonic mucosa appeared normal   DG SELECTED [facility] ONLY     REMOVAL OF IMPLANON  ROD  03/18/2021   WISDOM TOOTH EXTRACTION      OB History     Gravida  3   Para  2   Term  2   Preterm  0   AB  1   Living  2      SAB  1   IAB  0   Ectopic  0   Multiple  0   Live Births  2            Home Medications    Prior to Admission medications   Medication Sig Start Date End Date Taking? Authorizing Provider  cephALEXin  (KEFLEX ) 500 MG capsule Take 1 capsule (500 mg total) by mouth 2 (two)  times daily. 10/07/23  Yes Stuart Vernell Norris, PA-C  buPROPion  (WELLBUTRIN  SR) 150 MG 12 hr tablet Take 1 pill in the AM for 1 week, then increase to 1 pill BID 09/01/23   Johnson, [name], DO  Norethindrone  Acetate-Ethinyl Estradiol  (JUNEL 1.5/30) 1.5-30 MG-MCG tablet Take 1 tablet by mouth daily. 01/20/23   Johnson, [name], DO  nortriptyline  (PAMELOR ) 10 MG capsule Take 1 capsule (10 mg total) by mouth at bedtime. 09/01/23   Johnson, [name], DO  sertraline  (ZOLOFT ) 100 MG tablet Take 1 tablet (100 mg total) by mouth every morning. 09/01/23   Vicci Duwaine SQUIBB, DO    Family History Family History  Problem Relation Age of Onset   Diabetes Father    Heart disease Father    Alcohol abuse Father    Hypertension Father    Rheum arthritis Mother    Arthritis Mother        rheumatoid   Diabetes Paternal Grandfather    Congestive Heart Failure Paternal Grandfather    Alzheimer's disease Paternal Grandmother    Other Maternal Grandmother        obstruction in stomach   Stroke Maternal Grandfather    Diabetes Maternal Grandfather  Diabetes Sister    Hypertension Sister    Hypertension Brother    Autism Cousin    Autism Cousin    Colon cancer Neg Hx    Esophageal cancer Neg Hx    Rectal cancer Neg Hx    Stomach cancer Neg Hx     Social History Social History   Tobacco Use   Smoking status: Former    Current packs/day: 0.00    Average packs/day: 0.3 packs/day for 10.0 years (2.5 ttl pk-yrs)    Types: Cigarettes    Start date: 04/15/2006    Quit date: 04/14/2016    Years since quitting: 7.4   Smokeless tobacco: Never   Tobacco comments:    5 cigs per day  Vaping Use   Vaping status: Never Used  Substance Use Topics   Alcohol use: No    Comment: occ wine   Drug use: No     Allergies   Patient has no known allergies.   Review of Systems Review of Systems PER HPI  Physical Exam Triage Vital Signs ED Triage Vitals  Encounter Vitals Group     BP 10/07/23 1338 128/84      Girls Systolic BP Percentile --      Girls Diastolic BP Percentile --      Boys Systolic BP Percentile --      Boys Diastolic BP Percentile --      Pulse Rate 10/07/23 1338 87     Resp 10/07/23 1338 20     Temp 10/07/23 1338 98.2 F (36.8 C)     Temp Source 10/07/23 1338 Oral     SpO2 10/07/23 1338 98 %     Weight --      Height --      Head Circumference --      Peak Flow --      Pain Score 10/07/23 1340 2     Pain Loc --      Pain Education --      Exclude from Growth Chart --    No data found.  Updated Vital Signs BP 128/84 (BP Location: Right Arm)   Pulse 87   Temp 98.2 F (36.8 C) (Oral)   Resp 20   LMP 09/17/2023 (Approximate)   SpO2 98%   Visual Acuity Right Eye Distance:   Left Eye Distance:   Bilateral Distance:    Right Eye Near:   Left Eye Near:    Bilateral Near:     Physical Exam Vitals and nursing note reviewed.  Constitutional:      Appearance: Normal appearance. She is not ill-appearing.  HENT:     Head: Atraumatic.     Mouth/Throat:     Mouth: Mucous membranes are moist.  Eyes:     Extraocular Movements: Extraocular movements intact.     Conjunctiva/sclera: Conjunctivae normal.  Cardiovascular:     Rate and Rhythm: Normal rate and regular rhythm.     Heart sounds: Normal heart sounds.  Pulmonary:     Effort: Pulmonary effort is normal.     Breath sounds: Normal breath sounds.  Abdominal:     General: Bowel sounds are normal. There is no distension.     Palpations: Abdomen is soft.     Tenderness: There is no abdominal tenderness. There is no right CVA tenderness, left CVA tenderness or guarding.  Musculoskeletal:        General: Normal range of motion.     Cervical back: Normal range of motion and neck  supple.  Skin:    General: Skin is warm and dry.  Neurological:     Mental Status: She is alert and oriented to person, place, and time.  Psychiatric:        Mood and Affect: Mood normal.        Thought Content: Thought content  normal.        Judgment: Judgment normal.      UC Treatments / Results  Labs (all labs ordered are listed, but only abnormal results are displayed) Labs Reviewed  POCT URINE DIPSTICK - Abnormal; Notable for the following components:      Result Value   Color, UA light yellow (*)    Clarity, UA cloudy (*)    Blood, UA trace-intact (*)    Leukocytes, UA Small (1+) (*)    All other components within normal limits  URINE CULTURE    EKG   Radiology No results found.  Procedures Procedures (including critical care time)  Medications Ordered in UC Medications - No data to display  Initial Impression / Assessment and Plan / UC Course  I have reviewed the triage vital signs and the nursing notes.  Pertinent labs & imaging results that were available during my care of the patient were reviewed by me and considered in my medical decision making (see chart for details).     Vitals and exam reassuring today, urinalysis with evidence of a possible urinary tract infection.  Urine culture pending, will treat with Keflex , fluids and await urine culture adjusting if needed.  Return for worsening symptoms.  Final Clinical Impressions(s) / UC Diagnoses   Final diagnoses:  Acute lower UTI   Discharge Instructions   None    ED Prescriptions     Medication Sig Dispense Auth. Provider   cephALEXin  (KEFLEX ) 500 MG capsule Take 1 capsule (500 mg total) by mouth 2 (two) times daily. 10 capsule Stuart Vernell Norris, [place]      PDMP not reviewed this encounter.   Stuart Vernell Norris, PA-C 10/07/23 [phone]

## 2023-10-09 NOTE — Telephone Encounter (Signed)
 Patient went to [facility] and has gotten treated for UTI, so she no longer needs an appt.

## 2023-10-10 LAB — URINE CULTURE: Culture: >=100000 — AB

## 2023-10-12 ENCOUNTER — Ambulatory Visit (HOSPITAL_COMMUNITY): Payer: Self-pay

## 2023-10-15 NOTE — Patient Instructions (Signed)
 Managing Anxiety, Adult  After being diagnosed with anxiety, you may be relieved to know why you have felt or behaved a certain way. You may also feel overwhelmed about the treatment ahead and what it will mean for your life. With care and support, you can manage your anxiety.  How to manage lifestyle changes  Understanding the difference between stress and anxiety  Although stress can play a role in anxiety, it is not the same as anxiety. Stress is your body's reaction to life changes and events, both good and bad. Stress is often caused by something external, such as a deadline, test, or competition. It normally goes away after the event has ended and will last just a few hours. But, stress can be ongoing and can lead to more than just stress.  Anxiety is caused by something internal, such as imagining a terrible outcome or worrying that something will go wrong that will greatly upset you. Anxiety often does not go away even after the event is over, and it can become a long-term (chronic) worry.  Lowering stress and anxiety    Talk with your health care provider or a counselor to learn more about lowering anxiety and stress. They may suggest tension-reduction techniques, such as:  Music. Spend time creating or listening to music that you enjoy and that inspires you.  Mindfulness-based meditation. Practice being aware of your normal breaths while not trying to control your breathing. It can be done while sitting or walking.  Centering prayer. Focus on a word, phrase, or sacred image that means something to you and brings you peace.  Deep breathing. Expand your stomach and inhale slowly through your nose. Hold your breath for 3-5 seconds. Then breathe out slowly, letting your stomach muscles relax.  Self-talk. Learn to notice and spot thought patterns that lead to anxiety reactions. Change those patterns to thoughts that feel peaceful.  Muscle relaxation. Take time to tense muscles and then relax them.  Choose a  tension-reduction technique that fits your lifestyle and personality. These techniques take time and practice. Set aside 5-15 minutes a day to do them. Specialized therapists can offer counseling and training in these techniques. The training to help with anxiety may be covered by some insurance plans.  Other things you can do to manage stress and anxiety include:  Keeping a stress diary. This can help you learn what triggers your reaction and then learn ways to manage your response.  Thinking about how you react to certain situations. You may not be able to control everything, but you can control your response.  Making time for activities that help you relax and not feeling guilty about spending your time in this way.  Doing visual imagery. This involves imagining or creating mental pictures to help you relax.  Practicing yoga. Through yoga poses, you can lower tension and relax.     Medicines  Medicines for anxiety include:  Antidepressant medicines. These are usually prescribed for long-term daily control.  Anti-anxiety medicines. These may be added in severe cases, especially when panic attacks occur.  When used together, medicines, psychotherapy, and tension-reduction techniques may be the most effective treatment.  Relationships  Relationships can play a big part in helping you recover. Spend more time connecting with trusted friends and family members. Think about going to couples counseling if you have a partner, taking family education classes, or going to family therapy. Therapy can help you and others better understand your anxiety.  How to recognize changes in  your anxiety  Everyone responds differently to treatment for anxiety. Recovery from anxiety happens when symptoms lessen and stop interfering with your daily life at home or work. This may mean that you will start to:  Have better concentration and focus. Worry will interfere less in your daily thinking.  Sleep better.  Be less irritable.  Have  more energy.  Have improved memory.  Try to recognize when your condition is getting worse. Contact your provider if your symptoms interfere with home or work and you feel like your condition is not improving.  Follow these instructions at home:  Activity  Exercise. Adults should:  Exercise for at least 150 minutes each week. The exercise should increase your heart rate and make you sweat (moderate-intensity exercise).  Do strengthening exercises at least twice a week.  Get the right amount and quality of sleep. Most adults need 7-9 hours of sleep each night.  Lifestyle    Eat a healthy diet that includes plenty of vegetables, fruits, whole grains, low-fat dairy products, and lean protein.  Do not eat a lot of foods that are high in fats, added sugars, or salt (sodium).  Make choices that simplify your life.  Do not use any products that contain nicotine or tobacco. These products include cigarettes, chewing tobacco, and vaping devices, such as e-cigarettes. If you need help quitting, ask your provider.  Avoid caffeine, alcohol, and certain over-the-counter cold medicines. These may make you feel worse. Ask your pharmacist which medicines to avoid.  General instructions  Take over-the-counter and prescription medicines only as told by your provider.  Keep all follow-up visits. This is to make sure you are managing your anxiety well or if you need more support.  Where to find support  You can get help and support from:  Self-help groups.  Online and [employer].  A trusted spiritual leader.  Couples counseling.  Family education classes.  Family therapy.  Where to find more information  You may find that joining a support group helps you deal with your anxiety. The following sources can help you find counselors or support groups near you:  Mental Health America: mentalhealthamerica.net  Anxiety and Depression Association of [place] (ADAA): adaa.org  [employer] Mental [employer]ness (NAMI):  nami.org  Contact a health care provider if:  You have a hard time staying focused or finishing tasks.  You spend many hours a day feeling worried about everyday life.  You are very tired because you cannot stop worrying.  You start to have headaches or often feel tense.  You have chronic nausea or diarrhea.  Get help right away if:  Your heart feels like it is racing.  You have shortness of breath.  You have thoughts of hurting yourself or others.  Get help right away if you feel like you may hurt yourself or others, or have thoughts about taking your own life. Go to your nearest emergency room or:  Call 911.  Call the National Suicide Prevention Lifeline at (562)309-0957 or 988. This is open 24 hours a day.  Text the Crisis Text Line at 786-142-3811.  This information is not intended to replace advice given to you by your health care provider. Make sure you discuss any questions you have with your health care provider.  Document Revised: 11/09/2021 Document Reviewed: 05/24/2020  Elsevier Patient Education  2024 [employer].

## 2023-10-17 ENCOUNTER — Encounter: Payer: Self-pay | Admitting: Nurse Practitioner

## 2023-10-18 ENCOUNTER — Encounter: Payer: Self-pay | Admitting: Nurse Practitioner

## 2023-10-18 ENCOUNTER — Ambulatory Visit (INDEPENDENT_AMBULATORY_CARE_PROVIDER_SITE_OTHER): Admitting: Nurse Practitioner

## 2023-10-18 VITALS — BP 122/83 | HR 87 | Ht 62.0 in | Wt 124.0 lb

## 2023-10-18 DIAGNOSIS — F3341 Major depressive disorder, recurrent, in partial remission: Secondary | ICD-10-CM

## 2023-10-18 DIAGNOSIS — F419 Anxiety disorder, unspecified: Secondary | ICD-10-CM | POA: Diagnosis not present

## 2023-10-18 DIAGNOSIS — N390 Urinary tract infection, site not specified: Secondary | ICD-10-CM | POA: Diagnosis not present

## 2023-10-18 LAB — URINALYSIS, ROUTINE W REFLEX MICROSCOPIC
Bilirubin, UA: NEGATIVE
Glucose, UA: NEGATIVE
Ketones, UA: NEGATIVE
Nitrite, UA: NEGATIVE
Protein,UA: NEGATIVE
Specific Gravity, UA: <1.005 — ABNORMAL LOW (ref 1.005–1.030)
Urobilinogen, Ur: 0.2 mg/dL (ref 0.2–1.0)
pH, UA: 6 (ref 5.0–7.5)

## 2023-10-18 LAB — MICROSCOPIC EXAMINATION: Bacteria, UA: NONE SEEN

## 2023-10-18 LAB — WET PREP FOR TRICH, YEAST, CLUE
Clue Cell Exam: NEGATIVE
Trichomonas Exam: NEGATIVE
Yeast Exam: NEGATIVE

## 2023-10-18 MED ORDER — BUPROPION HCL ER (XL) 300 MG PO TB24: 300.0000 mg | ORAL_TABLET | Freq: Every day | ORAL | 4 refills | Status: DC

## 2023-10-18 NOTE — Progress Notes (Signed)
 BP 122/83   Pulse 87   Ht 5' 2 (1.575 m)   Wt 124 lb (56.2 kg)   LMP 09/17/2023 (Approximate)   SpO2 98%   BMI 22.68 kg/m    Subjective:    Patient ID: Angela [name], female    DOB: [birth year], 38 y.o.   MRN: [id number]  HPI: Angela [name] is a 38 y.o. female  Chief Complaint  Patient presents with   ADHD   Recurrent UTI   URINARY SYMPTOMS Was treated for UTI on 10/12/23, treated with Keflex  for E Coli.  Has completed treatment.  Then over weekend started back with symptoms. Dysuria: sometimes Urinary frequency: yes Urgency: yes Small volume voids: no Symptom severity: yes Urinary incontinence: no Foul odor: yes Hematuria: yes x one episode of spotting yesterday Abdominal pain: no Back pain: no Suprapubic pain/pressure: no Flank pain: no Fever:  no Vomiting: nauseous one night Status: recurrent Previous urinary tract infection: yes Recurrent urinary tract infection: no Sexual activity: monogamous Treatments attempted: cranberry and increasing fluids    DEPRESSION & DECREASED FOCUS Presents for follow-up today.  Taking Wellbutrin , Sertraline , Pamelor . Wellbutrin  was started on 09/01/23 currently taking twice a day, feels it is not working 100%.  Difficulty with focus and sitting still. Has visit with neuropsych September 30th.   Mood status: uncontrolled Satisfied with current treatment?: yes Symptom severity: mild  Duration of current treatment : chronic Side effects: no Medication compliance: good compliance Psychotherapy/counseling: yes in the past  Depressed mood: a little bit Anxious mood: yes Anhedonia: no Significant weight loss or gain: no Insomnia: staying up more on Wellbutrin  Fatigue: no Feelings of worthlessness or guilt: no Impaired concentration/indecisiveness: yes Suicidal ideations: no Hopelessness: no Crying spells: no    10/18/2023    3:52 PM 11/29/2022    4:18 PM 11/03/2021   10:07 AM 09/22/2021   10:04 AM 07/01/2021    4:31 PM   Depression screen PHQ 2/9  Decreased Interest 0 0 0 0 0  Down, Depressed, Hopeless 1 0 0 0 0  PHQ - 2 Score 1 0 0 0 0  Altered sleeping 1 0 0 0 0  Tired, decreased energy 3 1 0 2 3  Change in appetite 1 0 1 0 0  Feeling bad or failure about yourself  0 0 0 1 1  Trouble concentrating 3 0 3 0 1  Moving slowly or fidgety/restless 3 0 0 0 1  Suicidal thoughts 0 0 0 0 0  PHQ-9 Score 12 1 4 3 6   Difficult doing work/chores Very difficult Not difficult at all  Not difficult at all        10/18/2023    3:53 PM 11/29/2022    4:18 PM 11/03/2021   10:09 AM 09/22/2021   10:05 AM  GAD 7 : Generalized Anxiety Score  Nervous, Anxious, on Edge 3 1 2 1   Control/stop worrying 0 0 0 0  Worry too much - different things 0 0 0 0  Trouble relaxing 3 1 2 1   Restless 3 1 3 1   Easily annoyed or irritable 3 1 1 1   Afraid - awful might happen 0 0 0 0  Total GAD 7 Score 12 4 8 4   Anxiety Difficulty Very difficult Not difficult at all  Not difficult at all      Relevant past medical, surgical, family and social history reviewed and updated as indicated. Interim medical history since our last visit reviewed. Allergies and medications reviewed and updated.  Review of Systems  Per HPI unless specifically indicated above     Objective:    BP 122/83   Pulse 87   Ht 5' 2 (1.575 m)   Wt 124 lb (56.2 kg)   LMP 09/17/2023 (Approximate)   SpO2 98%   BMI 22.68 kg/m   Wt Readings from Last 3 Encounters:  10/18/23 124 lb (56.2 kg)  09/01/23 125 lb (56.7 kg)  11/29/22 129 lb 12.8 oz (58.9 kg)    Physical Exam  Results for orders placed or performed during the hospital encounter of 10/07/23  POCT URINE DIPSTICK   Collection Time: 10/07/23  1:48 PM  Result Value Ref Range   Color, UA light yellow (A) yellow   Clarity, UA cloudy (A) clear   Glucose, UA negative negative mg/dL   Bilirubin, UA negative negative   Ketones, POC UA negative negative mg/dL   Spec Grav, UA 8.984 8.989 - 1.025   Blood,  UA trace-intact (A) negative   pH, UA 8.0 5.0 - 8.0   Protein Ur, POC negative negative mg/dL   Urobilinogen, UA 0.2 0.2 or 1.0 E.U./dL   Nitrite, UA Negative Negative   Leukocytes, UA Small (1+) (A) Negative  Urine Culture   Collection Time: 10/07/23  2:52 PM   Specimen: Urine, Clean Catch  Result Value Ref Range   Specimen Description      URINE, CLEAN CATCH Performed at [facility], [address] [place], [place] [address]    Special Requests      NONE Performed at [facility], [address], [place], [place] [address]    Culture (A)     >=100,000 COLONIES/mL ESCHERICHIA COLI 10,000 COLONIES/mL ENTEROCOCCUS FAECALIS    Report Status 10/10/2023 FINAL    Organism ID, Bacteria ESCHERICHIA COLI (A)    Organism ID, Bacteria ENTEROCOCCUS FAECALIS (A)       Susceptibility   Escherichia coli - MIC*    AMPICILLIN  <=2 SENSITIVE Sensitive     CEFAZOLIN (URINE) Value in next row Sensitive      <=1 SENSITIVEThis is a modified FDA-approved test that has been validated and its performance characteristics determined by the reporting laboratory.  This laboratory is certified under the Clinical Laboratory Improvement Amendments CLIA as qualified to perform high complexity clinical laboratory testing.    CEFEPIME Value in next row Sensitive      <=1 SENSITIVEThis is a modified FDA-approved test that has been validated and its performance characteristics determined by the reporting laboratory.  This laboratory is certified under the Clinical Laboratory Improvement Amendments CLIA as qualified to perform high complexity clinical laboratory testing.    ERTAPENEM Value in next row Sensitive      <=1 SENSITIVEThis is a modified FDA-approved test that has been validated and its performance characteristics determined by the reporting laboratory.  This laboratory is certified under the Clinical Laboratory Improvement Amendments CLIA as qualified to perform high complexity clinical laboratory testing.     CEFTRIAXONE Value in next row Sensitive      <=1 SENSITIVEThis is a modified FDA-approved test that has been validated and its performance characteristics determined by the reporting laboratory.  This laboratory is certified under the Clinical Laboratory Improvement Amendments CLIA as qualified to perform high complexity clinical laboratory testing.    CIPROFLOXACIN Value in next row Sensitive      <=1 SENSITIVEThis is a modified FDA-approved test that has been validated and its performance characteristics determined by the reporting laboratory.  This laboratory is certified under the  Clinical Laboratory Improvement Amendments CLIA as qualified to perform high complexity clinical laboratory testing.    GENTAMICIN  Value in next row Sensitive      <=1 SENSITIVEThis is a modified FDA-approved test that has been validated and its performance characteristics determined by the reporting laboratory.  This laboratory is certified under the Clinical Laboratory Improvement Amendments CLIA as qualified to perform high complexity clinical laboratory testing.    NITROFURANTOIN Value in next row Sensitive      <=1 SENSITIVEThis is a modified FDA-approved test that has been validated and its performance characteristics determined by the reporting laboratory.  This laboratory is certified under the Clinical Laboratory Improvement Amendments CLIA as qualified to perform high complexity clinical laboratory testing.    TRIMETH/SULFA Value in next row Sensitive      <=1 SENSITIVEThis is a modified FDA-approved test that has been validated and its performance characteristics determined by the reporting laboratory.  This laboratory is certified under the Clinical Laboratory Improvement Amendments CLIA as qualified to perform high complexity clinical laboratory testing.    AMPICILLIN /SULBACTAM Value in next row Sensitive      <=1 SENSITIVEThis is a modified FDA-approved test that has been validated and its performance  characteristics determined by the reporting laboratory.  This laboratory is certified under the Clinical Laboratory Improvement Amendments CLIA as qualified to perform high complexity clinical laboratory testing.    PIP/TAZO Value in next row Sensitive ug/mL     <=4 SENSITIVEThis is a modified FDA-approved test that has been validated and its performance characteristics determined by the reporting laboratory.  This laboratory is certified under the Clinical Laboratory Improvement Amendments CLIA as qualified to perform high complexity clinical laboratory testing.    MEROPENEM Value in next row Sensitive      <=4 SENSITIVEThis is a modified FDA-approved test that has been validated and its performance characteristics determined by the reporting laboratory.  This laboratory is certified under the Clinical Laboratory Improvement Amendments CLIA as qualified to perform high complexity clinical laboratory testing.    * >=100,000 COLONIES/mL ESCHERICHIA COLI   Enterococcus faecalis - MIC*    AMPICILLIN  Value in next row Sensitive      <=4 SENSITIVEThis is a modified FDA-approved test that has been validated and its performance characteristics determined by the reporting laboratory.  This laboratory is certified under the Clinical Laboratory Improvement Amendments CLIA as qualified to perform high complexity clinical laboratory testing.    NITROFURANTOIN Value in next row Sensitive      <=4 SENSITIVEThis is a modified FDA-approved test that has been validated and its performance characteristics determined by the reporting laboratory.  This laboratory is certified under the Clinical Laboratory Improvement Amendments CLIA as qualified to perform high complexity clinical laboratory testing.    VANCOMYCIN Value in next row Sensitive      <=4 SENSITIVEThis is a modified FDA-approved test that has been validated and its performance characteristics determined by the reporting laboratory.  This laboratory is certified  under the Clinical Laboratory Improvement Amendments CLIA as qualified to perform high complexity clinical laboratory testing.    * 10,000 COLONIES/mL ENTEROCOCCUS FAECALIS      Assessment & Plan:   Problem List Items Addressed This Visit       Genitourinary   Recurrent UTI (urinary tract infection)   Ongoing symptoms, recently treated with Keflex  for UTI which on review of testing was susceptible to E. Coli present.  Will recheck UA and also check wet prep today.  Urine sent for culture and  will treat as needed.        Relevant Orders   WET PREP FOR TRICH, YEAST, CLUE   Urinalysis, Routine w reflex microscopic   Urine Culture     Other   Recurrent major depressive disorder, in partial remission (HCC) - Primary   Chronic, overall stable with current medication regimen.  Denies SI/HI.  She reports anxiety is more prevalent than depression.  Suspect some underlying ADD/ADHD, she is scheduled for neuropsych testing upcoming.  For now will stop SR Wellbutrin  and change to Wellbutrin  XL 300 MG daily which may cause less issue with sleep pattern and may offer more benefit to focus throughout the day.  Plan to have her return in October with PCP and discussed with her if no benefit from Wellbutrin  at that time and testing results are present may change to alternate medication for focus.      Relevant Medications   buPROPion  (WELLBUTRIN  XL) 300 MG 24 hr tablet   Anxiety   Ongoing, she feels anxiety is more prevalent than depression. Suspect some underlying ADD/ADHD, she is scheduled for neuropsych testing upcoming.  For now will stop SR Wellbutrin  and change to Wellbutrin  XL 300 MG daily which may cause less issue with sleep pattern and may offer more benefit to focus throughout the day.  Plan to have her return in October with PCP and discussed with her if no benefit from Wellbutrin  at that time and testing results are present may change to alternate medication for focus.      Relevant  Medications   buPROPion  (WELLBUTRIN  XL) 300 MG 24 hr tablet     Follow up plan: Return in about 6 weeks (around 11/29/2023) for Anxiety with PCP.

## 2023-10-18 NOTE — Assessment & Plan Note (Signed)
 Ongoing, she feels anxiety is more prevalent than depression. Suspect some underlying ADD/ADHD, she is scheduled for neuropsych testing upcoming.  For now will stop SR Wellbutrin  and change to Wellbutrin  XL 300 MG daily which may cause less issue with sleep pattern and may offer more benefit to focus throughout the day.  Plan to have her return in October with PCP and discussed with her if no benefit from Wellbutrin  at that time and testing results are present may change to alternate medication for focus.

## 2023-10-18 NOTE — Assessment & Plan Note (Addendum)
 Ongoing symptoms, recently treated with Keflex  for UTI which on review of testing was susceptible to E. Coli present.  Will recheck UA and also check wet prep today.  Urine sent for culture and will treat as needed.

## 2023-10-18 NOTE — Assessment & Plan Note (Signed)
 Chronic, overall stable with current medication regimen.  Denies SI/HI.  She reports anxiety is more prevalent than depression.  Suspect some underlying ADD/ADHD, she is scheduled for neuropsych testing upcoming.  For now will stop SR Wellbutrin  and change to Wellbutrin  XL 300 MG daily which may cause less issue with sleep pattern and may offer more benefit to focus throughout the day.  Plan to have her return in October with PCP and discussed with her if no benefit from Wellbutrin  at that time and testing results are present may change to alternate medication for focus.

## 2023-10-20 ENCOUNTER — Telehealth: Payer: Self-pay | Admitting: Family Medicine

## 2023-10-20 NOTE — Telephone Encounter (Unsigned)
 Copied from CRM [id number]. Topic: Clinical - Lab/Test Results >> Oct 20, 2023  9:15 AM Tiffini S wrote: Reason for CRM: Patient called about the results from a vaginal swab/ urine on 10/17/23-  explained that the labs were collected and sent to [facility] and are pending for the final results per calling CAL. Patient is asking for a call back at [phone] for a antibiotic to be sent to pharmacy

## 2023-10-21 ENCOUNTER — Ambulatory Visit: Payer: Self-pay | Admitting: Nurse Practitioner

## 2023-10-21 LAB — URINE CULTURE

## 2023-10-21 MED ORDER — AMOXICILLIN-POT CLAVULANATE 875-125 MG PO TABS: 1.0000 | ORAL_TABLET | Freq: Two times a day (BID) | ORAL | 0 refills | Status: AC

## 2023-10-21 NOTE — Progress Notes (Signed)
 Contacted via MyChart  Good evening Angela [name], your urine is still showing >100,000 bacterial growth.  I am sending in Augmentin  and if once completed you continue to have symptoms let us  know.

## 2023-11-03 ENCOUNTER — Other Ambulatory Visit: Payer: Self-pay | Admitting: Family Medicine

## 2023-11-03 ENCOUNTER — Ambulatory Visit (INDEPENDENT_AMBULATORY_CARE_PROVIDER_SITE_OTHER)

## 2023-11-03 ENCOUNTER — Ambulatory Visit: Payer: Self-pay

## 2023-11-03 VITALS — BP 128/84 | HR 79 | Ht 62.0 in | Wt 126.6 lb

## 2023-11-03 DIAGNOSIS — R102 Pelvic and perineal pain: Secondary | ICD-10-CM

## 2023-11-03 DIAGNOSIS — R1030 Lower abdominal pain, unspecified: Secondary | ICD-10-CM | POA: Diagnosis not present

## 2023-11-03 LAB — POCT URINE DIPSTICK
Bilirubin, UA: NEGATIVE
Blood, UA: NEGATIVE
Glucose, UA: NEGATIVE mg/dL
Ketones, POC UA: NEGATIVE mg/dL
Nitrite, UA: NEGATIVE
POC PROTEIN,UA: NEGATIVE
Spec Grav, UA: <=1.005 — AB (ref 1.010–1.025)
Urobilinogen, UA: 0.2 U/dL
pH, UA: 7 (ref 5.0–8.0)

## 2023-11-03 NOTE — Telephone Encounter (Signed)
 FYI Only or Action Required?: FYI only for provider.  Patient was last seen in primary care on 10/18/2023 by Cannady, Jolene T, NP.  Called Nurse Triage reporting No chief complaint on file..  Symptoms began today.  Interventions attempted: Nothing.  Symptoms are: gradually worsening.  Triage Disposition: See Physician Within 24 Hours  Patient/caregiver understands and will follow disposition?: Yes  Reason for Disposition  Unusual vaginal discharge (e.g., bad smelling, yellow, green, or foamy-white)  Answer Assessment - Initial Assessment Questions 1. MAIN SYMPTOM: What is the main symptom you are concerned about? (e.g., painful urination, urine frequency)     Cramping lower abdomen, urinary discomfort 2. BETTER-SAME-WORSE: Are you getting better, staying the same, or getting worse compared to how you felt at your last visit to the doctor (most recent medical visit)?     Patient felt UTI cleared- and now has pain again 3. PAIN: How bad is the pain?  (e.g., Scale 1-10; mild, moderate, or severe)     Minimal 3/10 4. FEVER: Do you have a fever? If Yes, ask: What is it, how was it measured, and when did it start?     no 5. OTHER SYMPTOMS: Do you have any other symptoms? (e.g., blood in the urine, flank pain, vaginal discharge)     Vaginal discharge- thin leakage 6. DIAGNOSIS: When was the UTI diagnosed? By whom? Was it a kidney infection, bladder infection or both?     UC-8/23, OV 9/3 7. ANTIBIOTIC: What antibiotic(s) are you taking? How many times per day?     Keflex , amoxicillin -clavulanate (AUGMENTIN ) 875-125 MG tablet   8. ANTIBIOTIC - START DATE: When did you start taking the antibiotic?     See above  Answer Assessment - Initial Assessment Questions 1. SEVERITY: How bad is the pain?  (e.g., Scale 1-10; mild, moderate, or severe)     3/10 2. FREQUENCY: How many times have you had painful urination today?      Uncomfortable - no pain  4. ONSET:  When did the painful urination start?      Patient has been treated x2- got better 2 days- now having symptoms- patient states she is leaving town tomorrow  5. FEVER: Do you have a fever? If Yes, ask: What is your temperature, how was it measured, and when did it start?     no 6. PAST UTI: Have you had a urine infection before? If Yes, ask: When was the last time? and What happened that time?      Yes- 8/28,9/3 7. CAUSE: What do you think is causing the painful urination?  (e.g., UTI, scratch, Herpes sore)     UTI, vaginal discharge is present 8. OTHER SYMPTOMS: Do you have any other symptoms? (e.g., blood in urine, flank pain, genital sores, urgency, vaginal discharge)     Vaginal discharge  Protocols used: Urinary Tract Infection on Antibiotic Follow-up Call - Female-A-AH, Urination Pain - Female-A-AH

## 2023-11-03 NOTE — Telephone Encounter (Signed)
 Copied from CRM #[id number]. Topic: Clinical - Medication Refill >> Nov 03, 2023  3:16 PM Dawna HERO wrote: Medication:  sertraline  (ZOLOFT ) 100 MG tablet Has the patient contacted their pharmacy? Yes,contact provider (Agent: If no, request that the patient contact the pharmacy for the refill. If patient does not wish to contact the pharmacy document the reason why and proceed with request.) (Agent: If yes, when and what did the pharmacy advise?)  This is the patient's preferred pharmacy:  [id number] - [place], [place] - [address] SEC [address] [place] [address] Phone: [phone] Fax: [phone]  Is this the correct pharmacy for this prescription? Yes If no, delete pharmacy and type the correct one.   Has the prescription been filled recently? Yes  Is the patient out of the medication? Yes  Has the patient been seen for an appointment in the last year OR does the patient have an upcoming appointment? Yes  Can we respond through MyChart? Yes  Agent: Please be advised that Rx refills may take up to 3 business days. We ask that you follow-up with your pharmacy.

## 2023-11-03 NOTE — Telephone Encounter (Signed)
    Message from [place] E sent at 11/03/2023 10:14 AM EDT  Summary: UTI symptoms (returning)   Pt called reporting that she has recently been treated for a UTI at urgent care. However she feels the symptoms returning and wants to know if her PCP will call her another antibiotic in.  Best contact: [phone]

## 2023-11-03 NOTE — Progress Notes (Signed)
 Acute Patient Visit  Physician: Manuel Dall A Ulah Olmo, MD  Patient: Angela [name] MRN: [id number] DOB: Jan 08, 1986 PCP: Vicci Duwaine SQUIBB, DO     Subjective:   Chief Complaint  Patient presents with   Abdominal Pain    Recently completed Abx for UTI     HPI: The patient is a 38 y.o. female who presents today for:   Discussed the use of AI scribe software for clinical note transcription with the patient, who gave verbal consent to proceed.  History of Present Illness   Angela [name] is a 38 year old female who presents with recurrent urinary tract infection symptoms and low abdominal pain.  Recurrent lower pelvic pain - History of urinary tract infection in August, treated with antibiotics at urgent care - Symptoms recurred within days after completing initial antibiotic course; completed a second course of a different antibiotic - Burning sensation during urination present yesterday, currently resolved - No fever, chills, or back pain.  Pain bilateral  - LMP 1 week ago, she is on birth control - No recent sexual activity due to ongoing discomfort - No chance of pregnancy or sexually transmitted diseases - Onset of low abdominal pain yesterday, described as crampy and achy - Pain localized to the lower pelvic region  - Discomfort in the vaginal area - Discomfort has been persistent since yesterday  Gynecologic and genitourinary history - No history of fibroids, ovarian cysts, or kidney stones - Pap smears have been normal - Currently on birth control  - She did have pelvic exam with trichomonas and BV which was negative  - Overall symptoms for 4 weeks  Gastrointestinal symptoms - Bowel movements are normal      ROS:   As noted in the HPI    ASSESMENT/PLAN:  Encounter Diagnoses  Name Primary?   Lower abdominal pain Yes   Pelvic pain     Orders Placed This Encounter  Procedures   Urine Culture   US  PELVIC COMPLETE WITH TRANSVAGINAL   POCT URINE  DIPSTICK    Assessment and Plan    Chronic or recurrent lower pelvic pain Recurrent lower pelvic pain with negative urine tests. No signs of STDs or pregnancy.  - Order transvaginal ultrasound to evaluate ovaries and pelvic structures. - Advise her to report any increase in pain, back pain, fever, or chills.  - Urinalysis today negative  External genital irritation External genital discomfort near the urethra with no discharge or recent sexual activity. Possible chemical or external irritation, but this would not explain pelvic discomfort - Monitor symptoms and report any changes or worsening of irritation.      Make f/u with PCP to discuss further after US     OBJECTIVE: Vitals:   11/03/23 1414  BP: 128/84  Pulse: 79  SpO2: 98%  Weight: 126 lb 9.6 oz (57.4 kg)  Height: 5' 2 (1.575 m)    Body mass index is 23.16 kg/m.   Physical Exam Vitals reviewed.  Constitutional:      Appearance: Normal appearance. Well-developed with normal weight.  Cardiovascular:     Rate and Rhythm: Normal rate and regular rhythm. Normal heart sounds. Normal peripheral pulses Pulmonary:     Normal breath sounds with normal effort Skin:    General: Skin is warm and dry without noticeable rash. Neurological:     General: No focal deficit present.  Psychiatric:        Mood and Affect: Mood, behavior and cognition normal  Allergies Patient has no known allergies.  Past Medical History Patient  has a past medical history of Bilateral carpal tunnel syndrome (11/18/2015), Cholestasis of pregnancy in third trimester (01/11/2017), Depression with anxiety (05/07/2014), GBS (group B streptococcus) infection (04/08/2019), GERD (gastroesophageal reflux disease), IBS (irritable bowel syndrome), Migraines, Multiple thyroid  nodules, Painful menstrual periods (11/13/2014), and Raynaud disease.  Surgical History Patient  has a past surgical history that includes Wisdom tooth extraction; Colonoscopy  (11/03/11); DG SELECTED HS[facility] ONLY; and Removal of implanon  rod (03/18/2021).  Family History Pateint's family history includes Alcohol abuse in her father; Alzheimer's disease in her paternal grandmother; Arthritis in her mother; Autism in her cousin and cousin; Congestive Heart Failure in her paternal grandfather; Diabetes in her father, maternal grandfather, paternal grandfather, and sister; Heart disease in her father; Hypertension in her brother, father, and sister; Other in her maternal grandmother; Rheum arthritis in her mother; Stroke in her maternal grandfather.  Social History Patient  reports that she quit smoking about 7 years ago. Her smoking use included cigarettes. She started smoking about 17 years ago. She has a 2.5 pack-year smoking history. She has never used smokeless tobacco. She reports that she does not drink alcohol and does not use drugs.    11/03/2023

## 2023-11-04 LAB — URINE CULTURE
MICRO NUMBER:: 16993255
Result:: NO GROWTH
SPECIMEN QUALITY:: ADEQUATE

## 2023-11-06 NOTE — Telephone Encounter (Signed)
 Too soon for refill, LRF 08/22/23 for 90 and 1 RF. E-Prescribing Status: Receipt confirmed by pharmacy (09/01/2023  2:37 PM EDT)    Requested Prescriptions  Pending Prescriptions Disp Refills   sertraline  (ZOLOFT ) 100 MG tablet 90 tablet 1    Sig: Take 1 tablet (100 mg total) by mouth every morning.     Psychiatry:  Antidepressants - SSRI - sertraline  Failed - 11/06/2023  8:41 AM      Failed - AST in normal range and within 360 days    AST  Date Value Ref Range Status  07/01/2021 26 0 - 40 IU/L Final   SGOT(AST)  Date Value Ref Range Status  10/09/2011 13 (L) 15 - 37 Unit/L Final         Failed - ALT in normal range and within 360 days    ALT  Date Value Ref Range Status  07/01/2021 10 0 - 32 IU/L Final   SGPT (ALT)  Date Value Ref Range Status  10/09/2011 17 12 - 78 U/L Final         Passed - Completed PHQ-2 or PHQ-9 in the last 360 days      Passed - Valid encounter within last 6 months    Recent Outpatient Visits           3 days ago Lower abdominal pain   [facility] Yonah, Mazomanie A, MD   2 weeks ago Recurrent major depressive disorder, in partial remission (HCC)   [place] [facility] [place], Friona T, NP   2 months ago Recurrent major depressive disorder, in partial remission [facility])   [facility] Vicci Duwaine SQUIBB, DO       Future Appointments             In 1 week Hayden Lye [facility] Physical Medicine and Rehabilitation, CPR   In 1 month Vicci, Duwaine SQUIBB, DO [facility], [address]

## 2023-11-10 ENCOUNTER — Ambulatory Visit

## 2023-11-13 ENCOUNTER — Ambulatory Visit
Admission: RE | Admit: 2023-11-13 | Discharge: 2023-11-13 | Disposition: A | Discharge status: Home / Self Care | Source: Ambulatory Visit

## 2023-11-13 DIAGNOSIS — R1030 Lower abdominal pain, unspecified: Secondary | ICD-10-CM | POA: Insufficient documentation

## 2023-11-14 ENCOUNTER — Encounter: Attending: Psychology | Admitting: Psychology

## 2023-11-14 DIAGNOSIS — F411 Generalized anxiety disorder: Secondary | ICD-10-CM | POA: Diagnosis not present

## 2023-11-14 DIAGNOSIS — R41840 Attention and concentration deficit: Secondary | ICD-10-CM | POA: Diagnosis not present
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
## 2023-11-20 ENCOUNTER — Other Ambulatory Visit: Payer: Self-pay

## 2023-11-20 ENCOUNTER — Telehealth: Payer: Self-pay

## 2023-11-20 ENCOUNTER — Encounter: Payer: Self-pay | Admitting: Family Medicine

## 2023-11-20 ENCOUNTER — Encounter: Attending: Psychology

## 2023-11-20 DIAGNOSIS — R41840 Attention and concentration deficit: Secondary | ICD-10-CM | POA: Diagnosis not present

## 2023-11-20 DIAGNOSIS — F411 Generalized anxiety disorder: Secondary | ICD-10-CM | POA: Diagnosis present

## 2023-11-20 NOTE — Progress Notes (Signed)
 Mental Status/Behavioral Observations (11/20/2023):  Sensory/Arousal: Hearing and vision were adequate for testing. The patient was alert but appeared to become mildly fatigued towards the end of testing. Appearance: Dress and hygiene were appropriate for the setting.   Speech/Language: In conversation, the patient's speech was prosodic, fluent, and well-articulated. The patient displayed no indications of word finding difficulties and no word substitution errors were observed.  Motor: The patient ambulated independently and without issue. No tremors were observed.  Social Comportment: Social behavior was appropriate to the setting. Mood/Affect: Mood was largely neutral. Affect was consistent with mood.  Attention/Concentration:  The patient appeared to maintain consistent engagement throughout testing. No frank attentional lapses were observed. Thought Process/Content: The patient's thought process was coherent, linear, goal directed. There were no indications of psychosis.  Additional Observations: The patient did not show any difficulties with understanding task instructions. She appeared mildly frustrated at times and occasionally made comments criticizing her performance on testing.  Neuropsychology Note Angela [name] completed 130 minutes of neuropsychological testing with technician, Angela [name], BA, under the supervision of Evalene Riff, PsyD., Clinical Neuropsychologist. The patient did not appear overtly distressed by the testing session, per behavioral observation or via self-report to the technician. Rest breaks were offered.   Clinical Decision Making: In considering the patient's current level of functioning, level of presumed impairment, nature of symptoms, emotional and behavioral responses during clinical interview, level of literacy, and observed level of motivation/effort, a battery of tests was selected by Dr. Riff during initial consultation on 11/14/2023. This was  communicated to the technician. Communication between the neuropsychologist and technician was ongoing throughout the testing session and changes were made as deemed necessary based on patient performance on testing, technician observations and additional pertinent factors such as those listed above.  Tests Administered: [place] Judgment of Line Orientation (JOLO) Conners Continuous Performance Test 3rd Edition (CPT-3) Delis-Kaplan Executive Function System (D-KEFS), select subtests Rey Complex Figure Test (RCFT), select subtests Wechsler Adult Intelligence Scale-Fourth Edition (WAIS-IV), select subtests Wechsler Abbreviated Scale of Intelligence (WASI-II) Wechsler Memory Scale-Third Edition (WMS-III), select subtests  [place]  Card Sorting Test [phone]) The Beck Depression Inventory-II (BDI-II) Beck Anxiety Inventory (BAI)   Results: Note: This summary of test scores accompanies the interpretive report and should not be interpreted by unqualified individuals or in isolation without reference to the report. Test scores are relative to age, gender, and educational history as available and appropriate. Measurement properties of test scores: IQ, Index, and Standard Scores (SS): Mean = 100; Standard Deviation = 15; Scaled Scores (ss): Mean = 10; Standard Deviation = 3; Z scores (Z): Mean = 0; Standard Deviation = 1; T scores (T); Mean = 50; Standard Deviation = 10  Intellectual/Premorbid Functioning Estimate   Norm Score Percentile Range  WASI-II FSIQ-4  SS = 104 61 %ile Average   Vocabulary   t = 56 73 %ile Average   Matrix Reasoning   t = 56 73 %ile Average   Block Design  t = 49 45 %ile Average   Similarities  t = 47 37 %ile Average   ATTENTION AND WORKING MEMORY   Norm Score Percentile Range  WAIS-IV          Digit Span  ss = 9 37 %ile Average   DSF  ss = 10 50 %ile Average   Span:    7      DSB  ss = 9 37 %ile Average   Span:    4  DSS  ss = 8 25 %ile Average   Span:    5      WMS-III          Spatial Span  ss = 10 50 %ile Average   SSF  ss = 13 84 %ile High Average   Span:    6      SSB  ss = 9 37 %ile Average   Span:    4      COMPLEX ATTENTION    Norm Score Percentile  Descriptor  CPT-3          Response Style       Balanced   Detectability (reverse scored)  t = 56 76 %ile High average   Omission Errors  t = 49 73 %ile Average   Commission Errors  t = 56 75 %ile High average   Perseverations  t = 46 38 %ile Average   HRT (reaction time)  t = 36 5 %ile Atypically fast   HRT SD (reaction time consistency) t = 48 51 %ile Average   Variability (in RT consistency)  t = 47 48 %ile Average   HRT Block Change (over test)  t = 52 59 %ile Average   HRT ISI Change (by stimuli interval) t = 50 51 %ile Average   PROCESSING SPEED    Norm Score Percentile  Range  WAIS-IV          Coding  ss = 10 50 %ile Average   Symbol Search  ss = 11 63 %ile Average   Cancellation  ss = 12 75 %ile High Average   EXECUTIVE FUNCTIONING    Norm Score Percentile  Range  DKEFS - Color-Word Interference          Color Naming  ss = 6 9 %ile Low Average   Word Reading  ss = 9 37 %ile Average   Inhibition  ss = 8 25 %ile Average   Errors  ss = 10 50 %ile Average   Inhibition Switching  ss = 7 16 %ile Low Average   Errors  ss = 9 37 %ile Average  DKEFS - Trails          Condition 1  ss = 9 37 %ile Average   Condition II  ss = 10 50 %ile Average   Condition III  ss = 9 37 %ile Average   Condition IV  ss = 9 37 %ile Average   Condition V  ss = 13 84 %ile High Average  [place]  Card Sorting Test (WCST)          Total Errors  t = 31 3 %ile Below Average   Perseverative Errors  t = 27 1 %ile Exceptionally Low   Non-Perseverative Errors  t = 36 8 %ile Below Average   % Conceptual Responses  t = 34 5 %ile Below Average   Categories Completed     11 to 16  %ile Low Average   Trials to First Category Complete     >16 %ile WNL   Failure to Maintain Set     >16 %ile WNL    VISUAL-SPATIAL    Norm Score Percentile  Range  WASI-II          Block Design  t = 49 45 %ile Average   Matrix Reasoning  t = 56 73 %ile Average  [place] JOLO     40 %ile Average  Rey Complex Figure Copy       >  16 %ile WNL   PERSONALITY AND BEHAVIORAL FUNCTIONING      Score/Interpretation  BDI Raw       14  BDI Severity       Mild.  BAI Raw       7  BAI Severity       Minimal.    Feedback to Patient: Angela [name] will return on 12/08/2023 for an interactive feedback session with Dr. Hayden at which time her test performances, clinical impressions and treatment recommendations will be reviewed in detail. The patient understands she can contact our office should she require our assistance before this time.  130 minutes spent face-to-face with patient administering standardized tests, 30 minutes spent scoring Radiographer, therapeutic). [CPT [id number], [id number]]  Full report to follow.

## 2023-11-20 NOTE — Telephone Encounter (Unsigned)
 Copied from CRM [id number]. Topic: Clinical - Medication Question >> Nov 20, 2023  3:40 PM Yolanda T wrote: Reason for CRM: patient stated she was only given 30 tablets when she picked up her meds in Sept. She has an appt on 10/13 but can not wait until then to get her medication buPROPion  (WELLBUTRIN  XL) 300 MG 24 hr tablet. Please f/u with patient

## 2023-11-20 NOTE — Telephone Encounter (Signed)
 Spoke with pt

## 2023-11-27 ENCOUNTER — Ambulatory Visit (INDEPENDENT_AMBULATORY_CARE_PROVIDER_SITE_OTHER): Admitting: Family Medicine

## 2023-11-27 ENCOUNTER — Other Ambulatory Visit (HOSPITAL_COMMUNITY)
Admission: RE | Admit: 2023-11-27 | Discharge: 2023-11-27 | Disposition: A | Discharge status: Home / Self Care | Source: Ambulatory Visit | Attending: Family Medicine | Admitting: Family Medicine

## 2023-11-27 ENCOUNTER — Encounter: Payer: Self-pay | Admitting: Family Medicine

## 2023-11-27 VITALS — BP 119/84 | HR 78 | Temp 98.2°F | Ht 62.0 in | Wt 126.0 lb

## 2023-11-27 DIAGNOSIS — Z0000 Encounter for general adult medical examination without abnormal findings: Secondary | ICD-10-CM | POA: Diagnosis not present

## 2023-11-27 DIAGNOSIS — N83209 Unspecified ovarian cyst, unspecified side: Secondary | ICD-10-CM | POA: Diagnosis not present

## 2023-11-27 DIAGNOSIS — F3341 Major depressive disorder, recurrent, in partial remission: Secondary | ICD-10-CM | POA: Diagnosis not present

## 2023-11-27 MED ORDER — SERTRALINE HCL 100 MG PO TABS: 150.0000 mg | ORAL_TABLET | Freq: Every morning | ORAL | 0 refills | Status: DC

## 2023-11-27 MED ORDER — BUPROPION HCL ER (SR) 150 MG PO TB12: ORAL_TABLET | ORAL | 0 refills | Status: DC

## 2023-11-27 NOTE — Assessment & Plan Note (Signed)
 Not doing well with her wellbutrin - no improvement. Will stop wellbutrin  and increase her sertraline . Call with any concerns. Continue to monitor.

## 2023-11-27 NOTE — Progress Notes (Signed)
 BP 119/84   Pulse 78   Temp 98.2 F (36.8 C) (Oral)   Ht 5' 2 (1.575 m)   Wt 126 lb (57.2 kg)   LMP 11/23/2023 (Approximate)   SpO2 98%   BMI 23.05 kg/m    Subjective:    Patient ID: Angela [name], female    DOB: Aug 30, 1985, 38 y.o.   MRN: [id number]  HPI: Angela [name] is a 38 y.o. female presenting on 11/27/2023 for comprehensive medical examination. Current medical complaints include:  Had pelvic pain about 2 weeks ago and had an US  which showed a hemorrhagic cyst.  ANXIETY/DEPRESSION Duration: chronic Status:uncontrolled Anxious mood: yes  Excessive worrying: yes Irritability: yes  Sweating: no Nausea: no Palpitations:no Hyperventilation: no Panic attacks: no Agoraphobia: no  Obscessions/compulsions: no Depressed mood: yes    11/27/2023    9:37 AM 11/03/2023    2:26 PM 10/18/2023    3:52 PM 11/29/2022    4:18 PM 11/03/2021   10:07 AM  Depression screen PHQ 2/9  Decreased Interest 0 0 0 0 0  Down, Depressed, Hopeless 1 1 1  0 0  PHQ - 2 Score 1 1 1  0 0  Altered sleeping 0 0 1 0 0  Tired, decreased energy 2 1 3 1  0  Change in appetite 0 0 1 0 1  Feeling bad or failure about yourself  0 0 0 0 0  Trouble concentrating 3 3 3  0 3  Moving slowly or fidgety/restless 3 0 3 0 0  Suicidal thoughts 0 0 0 0 0  PHQ-9 Score 9 5 12 1 4   Difficult doing work/chores Somewhat difficult Somewhat difficult Very difficult Not difficult at all       11/27/2023    9:37 AM 11/03/2023    2:26 PM 10/18/2023    3:53 PM 11/29/2022    4:18 PM  GAD 7 : Generalized Anxiety Score  Nervous, Anxious, on Edge 3 1 3 1   Control/stop worrying 0 0 0 0  Worry too much - different things 0 0 0 0  Trouble relaxing 3 3 3 1   Restless 3 1 3 1   Easily annoyed or irritable 3 1 3 1   Afraid - awful might happen 0 0 0 0  Total GAD 7 Score 12 6 12 4   Anxiety Difficulty Not difficult at all Somewhat difficult Very difficult Not difficult at all   Anhedonia: no Weight changes: no Insomnia:  no   Hypersomnia: no Fatigue/loss of energy: yes Feelings of worthlessness: no Feelings of guilt: no Impaired concentration/indecisiveness: no Suicidal ideations: no  Crying spells: yes Recent Stressors/Life Changes: yes   Relationship problems: no   Family stress: no     Financial stress: no    Job stress: no    Recent death/loss: no  She currently lives with: husband and kids Menopausal Symptoms: no  Depression Screen done today and results listed below:     11/27/2023    9:37 AM 11/03/2023    2:26 PM 10/18/2023    3:52 PM 11/29/2022    4:18 PM 11/03/2021   10:07 AM  Depression screen PHQ 2/9  Decreased Interest 0 0 0 0 0  Down, Depressed, Hopeless 1 1 1  0 0  PHQ - 2 Score 1 1 1  0 0  Altered sleeping 0 0 1 0 0  Tired, decreased energy 2 1 3 1  0  Change in appetite 0 0 1 0 1  Feeling bad or failure about yourself  0 0 0  0 0  Trouble concentrating 3 3 3  0 3  Moving slowly or fidgety/restless 3 0 3 0 0  Suicidal thoughts 0 0 0 0 0  PHQ-9 Score 9 5 12 1 4   Difficult doing work/chores Somewhat difficult Somewhat difficult Very difficult Not difficult at all     Past Medical History:  Past Medical History:  Diagnosis Date   Bilateral carpal tunnel syndrome 11/18/2015   Cholestasis of pregnancy in third trimester 01/11/2017   Depression with anxiety 05/07/2014   GBS (group B streptococcus) infection 04/08/2019   PCN prophylaxis in labor   GERD (gastroesophageal reflux disease)    IBS (irritable bowel syndrome)    Migraines    Multiple thyroid  nodules    Painful menstrual periods 11/13/2014   Raynaud disease     Surgical History:  Past Surgical History:  Procedure Laterality Date   COLONOSCOPY  11/03/11   [place]:colonic mucosa appeared normal   DG SELECTED [facility] ONLY     REMOVAL OF IMPLANON  ROD  03/18/2021   WISDOM TOOTH EXTRACTION      Medications:  Current Outpatient Medications on File Prior to Visit  Medication Sig   Norethindrone  Acetate-Ethinyl  Estradiol  (JUNEL 1.5/30) 1.5-30 MG-MCG tablet Take 1 tablet by mouth daily.   nortriptyline  (PAMELOR ) 10 MG capsule Take 1 capsule (10 mg total) by mouth at bedtime.   No current facility-administered medications on file prior to visit.    Allergies:  No Known Allergies  Social History:  Social History   Socioeconomic History   Marital status: Married    Spouse name: Not on file   Number of children: 1   Years of education: college   Highest education level: Bachelor's degree (e.g., BA, AB, BS)  Occupational History   Occupation: Child psychotherapist  Tobacco Use   Smoking status: Former    Current packs/day: 0.00    Average packs/day: 0.3 packs/day for 10.0 years (2.5 ttl pk-yrs)    Types: Cigarettes    Start date: 04/15/2006    Quit date: 04/14/2016    Years since quitting: 7.6   Smokeless tobacco: Never   Tobacco comments:    5 cigs per day  Vaping Use   Vaping status: Never Used  Substance and Sexual Activity   Alcohol use: No    Comment: occ wine   Drug use: No   Sexual activity: Yes  Other Topics Concern   Not on file  Social History Narrative   Not on file   Social Drivers of Health   Financial Resource Strain: Low Risk  (09/01/2023)   Overall Financial Resource Strain (CARDIA)    Difficulty of Paying Living Expenses: Not hard at all  Food Insecurity: No Food Insecurity (09/01/2023)   Hunger Vital Sign    Worried About Running Out of Food in the Last Year: Never true    Ran Out of Food in the Last Year: Never true  Transportation Needs: No Transportation Needs (09/01/2023)   PRAPARE - Administrator, Civil Service (Medical): No    Lack of Transportation (Non-Medical): No  Physical Activity: Inactive (09/01/2023)   Exercise Vital Sign    Days of Exercise per Week: 0 days    Minutes of Exercise per Session: Not on file  Stress: Stress Concern Present (09/01/2023)   [employer] of Occupational Health - Occupational Stress Questionnaire    Feeling of  Stress: Rather much  Social Connections: Socially Integrated (09/01/2023)   Social Connection and Isolation Panel  Frequency of Communication with Friends and Family: More than three times a week    Frequency of Social Gatherings with Friends and Family: Once a week    Attends Religious Services: More than 4 times per year    Active Member of [employer] or Organizations: Yes    Attends Engineer, structural: More than 4 times per year    Marital Status: Married  Catering manager Violence: Not At Risk (10/01/2018)   Humiliation, Afraid, Rape, and Kick questionnaire    Fear of Current or Ex-Partner: No    Emotionally Abused: No    Physically Abused: No    Sexually Abused: No   Social History   Tobacco Use  Smoking Status Former   Current packs/day: 0.00   Average packs/day: 0.3 packs/day for 10.0 years (2.5 ttl pk-yrs)   Types: Cigarettes   Start date: 04/15/2006   Quit date: 04/14/2016   Years since quitting: 7.6  Smokeless Tobacco Never  Tobacco Comments   5 cigs per day   Social History   Substance and Sexual Activity  Alcohol Use No   Comment: occ wine    Family History:  Family History  Problem Relation Age of Onset   Diabetes Father    Heart disease Father    Alcohol abuse Father    Hypertension Father    Rheum arthritis Mother    Arthritis Mother        rheumatoid   Diabetes Paternal Grandfather    Congestive Heart Failure Paternal Grandfather    Alzheimer's disease Paternal Grandmother    Other Maternal Grandmother        obstruction in stomach   Stroke Maternal Grandfather    Diabetes Maternal Grandfather    Diabetes Sister    Hypertension Sister    Hypertension Brother    Autism Cousin    Autism Cousin    Colon cancer Neg Hx    Esophageal cancer Neg Hx    Rectal cancer Neg Hx    Stomach cancer Neg Hx     Past medical history, surgical history, medications, allergies, family history and social history reviewed with patient today and changes made  to appropriate areas of the chart.   Review of Systems  Constitutional:  Positive for diaphoresis. Negative for chills, fever, malaise/fatigue and weight loss.  HENT: Negative.    Eyes:  Positive for blurred vision. Negative for double vision, photophobia, pain, discharge and redness.  Cardiovascular:  Positive for palpitations. Negative for chest pain, orthopnea, claudication, leg swelling and PND.  Gastrointestinal: Negative.   Genitourinary: Negative.   Musculoskeletal: Negative.   Skin: Negative.   Neurological:  Positive for headaches. Negative for dizziness, tingling, tremors, sensory change, speech change, focal weakness, seizures, loss of consciousness and weakness.  Endo/Heme/Allergies:  Positive for environmental allergies. Negative for polydipsia. Does not bruise/bleed easily.  Psychiatric/Behavioral:  Positive for depression. Negative for hallucinations, memory loss, substance abuse and suicidal ideas. The patient is nervous/anxious. The patient does not have insomnia.    All other ROS negative except what is listed above and in the HPI.      Objective:    BP 119/84   Pulse 78   Temp 98.2 F (36.8 C) (Oral)   Ht 5' 2 (1.575 m)   Wt 126 lb (57.2 kg)   LMP 11/23/2023 (Approximate)   SpO2 98%   BMI 23.05 kg/m   Wt Readings from Last 3 Encounters:  11/27/23 126 lb (57.2 kg)  11/03/23 126 lb 9.6 oz (57.4  kg)  10/18/23 124 lb (56.2 kg)    Physical Exam Vitals and nursing note reviewed.  Constitutional:      General: She is not in acute distress.    Appearance: Normal appearance. She is not ill-appearing, toxic-appearing or diaphoretic.  HENT:     Head: Normocephalic and atraumatic.     Right Ear: Tympanic membrane, ear canal and external ear normal. There is no impacted cerumen.     Left Ear: Tympanic membrane, ear canal and external ear normal. There is no impacted cerumen.     Nose: Nose normal. No congestion or rhinorrhea.     Mouth/Throat:     Mouth: Mucous  membranes are moist.     Pharynx: Oropharynx is clear. No oropharyngeal exudate or posterior oropharyngeal erythema.  Eyes:     General: No scleral icterus.       Right eye: No discharge.        Left eye: No discharge.     Extraocular Movements: Extraocular movements intact.     Conjunctiva/sclera: Conjunctivae normal.     Pupils: Pupils are equal, round, and reactive to light.  Neck:     Vascular: No carotid bruit.  Cardiovascular:     Rate and Rhythm: Normal rate and regular rhythm.     Pulses: Normal pulses.     Heart sounds: No murmur heard.    No friction rub. No gallop.  Pulmonary:     Effort: Pulmonary effort is normal. No respiratory distress.     Breath sounds: Normal breath sounds. No stridor. No wheezing, rhonchi or rales.  Chest:     Chest wall: No tenderness.  Abdominal:     General: Abdomen is flat. Bowel sounds are normal. There is no distension.     Palpations: Abdomen is soft. There is no mass.     Tenderness: There is no abdominal tenderness. There is no right CVA tenderness, left CVA tenderness, guarding or rebound.     Hernia: No hernia is present. There is no hernia in the left inguinal area or right inguinal area.  Genitourinary:    Labia:        Right: No rash, tenderness, lesion or injury.        Left: No rash, tenderness, lesion or injury.      Urethra: No prolapse, urethral pain, urethral swelling or urethral lesion.     Vagina: Normal.     Cervix: Normal.     Uterus: Normal.      Adnexa: Right adnexa normal and left adnexa normal.     Comments: Breast exams deferred with shared decision making Musculoskeletal:        General: No swelling, tenderness, deformity or signs of injury.     Cervical back: Normal range of motion and neck supple. No rigidity. No muscular tenderness.     Right lower leg: No edema.     Left lower leg: No edema.  Lymphadenopathy:     Cervical: No cervical adenopathy.  Skin:    General: Skin is warm and dry.     Capillary  Refill: Capillary refill takes less than 2 seconds.     Coloration: Skin is not jaundiced or pale.     Findings: No bruising, erythema, lesion or rash.  Neurological:     General: No focal deficit present.     Mental Status: She is alert and oriented to person, place, and time. Mental status is at baseline.     Cranial Nerves: No cranial nerve deficit.  Sensory: No sensory deficit.     Motor: No weakness.     Coordination: Coordination normal.     Gait: Gait normal.     Deep Tendon Reflexes: Reflexes normal.  Psychiatric:        Mood and Affect: Mood normal.        Behavior: Behavior normal.        Thought Content: Thought content normal.        Judgment: Judgment normal.     Results for orders placed or performed in visit on 11/03/23  POCT URINE DIPSTICK   Collection Time: 11/03/23  2:23 PM  Result Value Ref Range   Color, UA yellow yellow   Clarity, UA clear clear   Glucose, UA negative negative mg/dL   Bilirubin, UA negative negative   Ketones, POC UA negative negative mg/dL   Spec Grav, UA <=8.994 (A) 1.010 - 1.025   Blood, UA negative negative   pH, UA 7.0 5.0 - 8.0   POC PROTEIN,UA negative negative, trace   Urobilinogen, UA 0.2 0.2 or 1.0 E.U./dL   Nitrite, UA Negative Negative   Leukocytes, UA Small (1+) (A) Negative  Urine Culture   Collection Time: 11/03/23  2:54 PM   Specimen: Urine  Result Value Ref Range   MICRO NUMBER: [id number]    SPECIMEN QUALITY: Adequate    Sample Source URINE    STATUS: FINAL    Result: No Growth       Assessment & Plan:   Problem List Items Addressed This Visit       Other   Recurrent major depressive disorder, in partial remission   Not doing well with her wellbutrin - no improvement. Will stop wellbutrin  and increase her sertraline . Call with any concerns. Continue to monitor.       Relevant Medications   buPROPion  (WELLBUTRIN  SR) 150 MG 12 hr tablet   sertraline  (ZOLOFT ) 100 MG tablet   Other Visit Diagnoses        Routine general medical examination at a health care facility    -  Primary   Vaccines up to date. Screening labs checked today. Pap done today. Continue diet and exercise. Call with any concerns. Continue to monitor.   Relevant Orders   CBC with Differential/Platelet   Comprehensive metabolic panel with GFR   Lipid Panel w/o Chol/HDL Ratio   Cytology - PAP   TSH   Hepatitis B surface antibody,quantitative     Hemorrhagic ovarian cyst       No more pain. Due for repeat US  in 6-12 weeks. Call with any concerns.        Follow up plan: Return in about 6 weeks (around 01/08/2024) for virtual OK.   LABORATORY TESTING:  - Pap smear: pap done  IMMUNIZATIONS:   - Tdap: Tetanus vaccination status reviewed: last tetanus booster within 10 years. - Influenza: Refused - Pneumovax: Not applicable - Prevnar: Not applicable - COVID: Refused - HPV: Refused   PATIENT COUNSELING:   Advised to take 1 mg of folate supplement per day if capable of pregnancy.   Sexuality: Discussed sexually transmitted diseases, partner selection, use of condoms, avoidance of unintended pregnancy  and contraceptive alternatives.   Advised to avoid cigarette smoking.  I discussed with the patient that most people either abstain from alcohol or drink within safe limits (<=14/week and <=4 drinks/occasion for males, <=7/weeks and <= 3 drinks/occasion for females) and that the risk for alcohol disorders and other health effects rises proportionally with the number  of drinks per week and how often a drinker exceeds daily limits.  Discussed cessation/primary prevention of drug use and availability of treatment for abuse.   Diet: Encouraged to adjust caloric intake to maintain  or achieve ideal body weight, to reduce intake of dietary saturated fat and total fat, to limit sodium intake by avoiding high sodium foods and not adding table salt, and to maintain adequate dietary potassium and calcium preferably from fresh  fruits, vegetables, and low-fat dairy products.    stressed the importance of regular exercise  Injury prevention: Discussed safety belts, safety helmets, smoke detector, smoking near bedding or upholstery.   Dental health: Discussed importance of regular tooth brushing, flossing, and dental visits.    NEXT PREVENTATIVE PHYSICAL DUE IN 1 YEAR. Return in about 6 weeks (around 01/08/2024) for virtual OK.

## 2023-11-28 LAB — COMPREHENSIVE METABOLIC PANEL WITH GFR
ALT: 12 IU/L (ref 0–32)
AST: 14 IU/L (ref 0–40)
Albumin: 4.4 g/dL (ref 3.9–4.9)
Alkaline Phosphatase: 57 IU/L (ref 41–116)
BUN/Creatinine Ratio: 12 (ref 9–23)
BUN: 8 mg/dL (ref 6–20)
Bilirubin Total: 0.2 mg/dL (ref 0.0–1.2)
CO2: 23 mmol/L (ref 20–29)
Calcium: 9.8 mg/dL (ref 8.7–10.2)
Chloride: 104 mmol/L (ref 96–106)
Creatinine, Ser: 0.69 mg/dL (ref 0.57–1.00)
Globulin, Total: 2.4 g/dL (ref 1.5–4.5)
Glucose: 85 mg/dL (ref 70–99)
Potassium: 4.2 mmol/L (ref 3.5–5.2)
Sodium: 140 mmol/L (ref 134–144)
Total Protein: 6.8 g/dL (ref 6.0–8.5)
eGFR: 115 mL/min/1.73 (ref >59)

## 2023-11-28 LAB — CBC WITH DIFFERENTIAL/PLATELET
Basophils Absolute: 0 x10E3/uL (ref 0.0–0.2)
Basos: 1 %
EOS (ABSOLUTE): 0 x10E3/uL (ref 0.0–0.4)
Eos: 0 %
Hematocrit: 42.2 % (ref 34.0–46.6)
Hemoglobin: 12.8 g/dL (ref 11.1–15.9)
Immature Grans (Abs): 0 x10E3/uL (ref 0.0–0.1)
Immature Granulocytes: 0 %
Lymphocytes Absolute: 0.9 x10E3/uL (ref 0.7–3.1)
Lymphs: 32 %
MCH: 26.1 pg — ABNORMAL LOW (ref 26.6–33.0)
MCHC: 30.3 g/dL — ABNORMAL LOW (ref 31.5–35.7)
MCV: 86 fL (ref 79–97)
Monocytes Absolute: 0.2 x10E3/uL (ref 0.1–0.9)
Monocytes: 8 %
Neutrophils Absolute: 1.6 x10E3/uL (ref 1.4–7.0)
Neutrophils: 59 %
Platelets: 206 x10E3/uL (ref 150–450)
RBC: 4.9 x10E6/uL (ref 3.77–5.28)
RDW: 13.6 % (ref 11.7–15.4)
WBC: 2.7 x10E3/uL — ABNORMAL LOW (ref 3.4–10.8)

## 2023-11-28 LAB — HEPATITIS B SURFACE ANTIBODY, QUANTITATIVE: Hepatitis B Surf Ab Quant: 180 m[IU]/mL

## 2023-11-28 LAB — LIPID PANEL W/O CHOL/HDL RATIO
Cholesterol, Total: 185 mg/dL (ref 100–199)
HDL: 48 mg/dL (ref >39)
LDL Chol Calc (NIH): 115 mg/dL — ABNORMAL HIGH (ref 0–99)
Triglycerides: 121 mg/dL (ref 0–149)
VLDL Cholesterol Cal: 22 mg/dL (ref 5–40)

## 2023-11-28 LAB — TSH: TSH: 2.01 u[IU]/mL (ref 0.450–4.500)

## 2023-11-29 ENCOUNTER — Encounter: Admitting: Psychology

## 2023-11-29 ENCOUNTER — Telehealth: Payer: Self-pay

## 2023-11-29 DIAGNOSIS — F411 Generalized anxiety disorder: Secondary | ICD-10-CM

## 2023-11-29 DIAGNOSIS — F902 Attention-deficit hyperactivity disorder, combined type: Secondary | ICD-10-CM

## 2023-11-29 LAB — CYTOLOGY - PAP
Adequacy: ABSENT
Comment: NEGATIVE
Diagnosis: NEGATIVE
High risk HPV: NEGATIVE

## 2023-11-29 NOTE — Telephone Encounter (Unsigned)
 Copied from CRM [id number]. Topic: Clinical - Lab/Test Results >> Nov 29, 2023  2:52 PM Geneva B wrote: Reason for CRM: patient wants to go over test results please call pt  [phone] (M)

## 2023-11-30 ENCOUNTER — Ambulatory Visit: Payer: Self-pay | Admitting: Family Medicine

## 2023-11-30 ENCOUNTER — Encounter: Payer: Self-pay | Admitting: Family Medicine

## 2023-11-30 NOTE — Telephone Encounter (Signed)
 Labs have not been resulted. Routing to provider to advise on labs.

## 2023-12-04 ENCOUNTER — Encounter: Payer: Self-pay | Admitting: Family Medicine

## 2023-12-08 ENCOUNTER — Encounter: Admitting: Psychology

## 2023-12-08 DIAGNOSIS — F411 Generalized anxiety disorder: Secondary | ICD-10-CM

## 2023-12-08 DIAGNOSIS — F902 Attention-deficit hyperactivity disorder, combined type: Secondary | ICD-10-CM

## 2023-12-08 NOTE — Progress Notes (Addendum)
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 Some assistance is provided by family, as her spouse is away for work frequently. She described some ongoing difficulties with inattentive symptoms, although stress remains elevated, and she described not making time for self care. Difficulties with attention and concentration are suspicious for interference from psychiatric symptoms. The provider discussed this in detail with the patient, provided psychoeducation and explored contextual factors which appear to be impacting mental health, ones likely influenced by historical factors. The provider indicated that addressing mental health through self care, and ideally through individual therapy, would likely help both overall wellbeing but also cognitive functioning.   Test Results: Cognitive test data showed performances largely in-line with average range cognitive abilities in most tasks. Processing speed, visual spatial abilities, verbal and nonverbal abilities, and basic attention/working memory were consistent with intellectual abilities. There were some mild deficits within the domain of  executive functioning seen with tasks with relatively higher cognitive demands, combined several cognitive abilities. There was a slight performance decline when set-shifting was combined with response inhibition. She had considerably more difficulty on an applied reasoning task that required abstract concept formation, problem solving, and cognitive flexibility among other skills. On this measure, she identified and completed the first conceptually based rule-set without issue, but struggled to  adjust / adapt when the rule-set changed, scoring very low in perseverative errors, which impacted her overall progression through the task. There were subtle difficulties on a continuous performance test involving sustained attention, and subtle indications that may reflect slight impulsivity. Self-report measures of depression and anxiety showed mild depressive symptoms, but minimal symptoms of anxiety (although interview noted significant anxiety symptoms).   Conclusions: The patient met with the provider for feedback and reported that she has experienced a significant improvement in her symptoms of anxiety following a recent medication adjustment that occurred in the interim. She described improvements in her overall mental health, although endorsed continued difficulties with cognitive symptoms which lessens concerns somewhat anxiety is a significant contributor to her problems with attention and focus. Self-care and engagement with individual therapy is recommended, as discussed with the patient during the feedback visit. Difficulties with anxiety and stress can certainly worsen cognitive difficulties, particularly in the context with premorbid cognitive challenges, and addressing mental health is likely to benefit overall wellness. 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 Individual therapy would be beneficial for addressing this. (Given the brief duration of neuropsychological assessment and elements of formal personality disorder diagnosis, I will not be formally making that diagnosis due to inherent risks of false positives in this setting).   With respect to an ADHD diagnosis, diagnostically, criteria for early onset of childhood symptoms is met. Ideally, there is relative alignment with childhood symptoms and those which are current, as this increases confidence in trait-like / stable symptoms consistent with a neurodegenerative disorder (although some symptoms tend to remit to some degree in adulthood). Clear alignment is not diagnostically required, nor is there a specific symptom count for retrospective child symptoms or even domain specificity, only that there are several and that these can be in either domain. Retrospective reporting is inherently unreliable, even with collateral, and this is reflected in the flexible nature of that particular criteria. Technically, the patient shows the prerequisite number of both inattentive and hyperactive/impulsive symptom clusters, and duration is over 6 months, meeting criteria for ADHD, combined type. I suspect symptom count within the inattentive type may fall into the sub-clinical range with additional work in the area of stress and anxiety. However, the stability of hyperactive/impulsivity symptoms over time, and because the specific symptoms within that domain that she endorsed are those not easily accounted for by anxiety/stress, I believe it is likely that she would continue to meet criteria, albeit only for the hyperactive/impulsive subtype. I assert this with some caution, as this subtype is uncommon, but it does exist. I believe a stimulant medication trial would be appropriate as it can be  beneficial even for the hyperactive subtype, but I would encourage careful monitoring for worsening anxiety due to medication side effects.    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  Stimulant medication trial would be appropriate. Monitor for exacerbation of anxiety.  Individual therapy may be beneficial for the reasons noted above. If the patient wishes to seek a therapist on her own, [email or web address].com has a proofreader of providers that can be filtered by location, insurances accepted, specialty, and background information providers can be reviewed. A referral for individual therapy with  [facility] can be placed if desired. I defer to the referring provider and patient regarding how to proceed if deciding to engage in individual therapy.  As discussed during feedback, self-care and having outlets for stress relief is important and can help you be more effective in your other tasks. Allow yourself to take a break! It was a pleasure meeting with Angela [name], please feel free to reach out (patient or provider) with any questions that may emerge upon review of this report.      Evalene DOROTHA Riff, PsyD Clinical Neuropsychology [address] [place], [place] [address] Main: [phone] Fax: [phone]   [address]: [phone]   This report was generated using voice recognition software. While this document has been carefully reviewed, transcription errors may be present. I apologize in advance for any inconvenience. Please contact me if further clarification is needed.

## 2023-12-11 ENCOUNTER — Encounter: Admitting: Family Medicine

## 2023-12-17 ENCOUNTER — Other Ambulatory Visit: Payer: Self-pay | Admitting: Family Medicine

## 2023-12-19 NOTE — Telephone Encounter (Signed)
 Requested Prescriptions  Refused Prescriptions Disp Refills   buPROPion  (WELLBUTRIN  SR) 150 MG 12 hr tablet [Pharmacy Med Name: BUPROPION  SR 150MG  TABLETS (12 H)] 60 tablet 0    Sig: TAKE 2 TABS BY MOUTH EVERY MORNING AND 1 TABLET EVERY EVENING X4 TO 7 DAYS, 1 TABLET TWICE DAILY X4 TO 7 DAYS, 1 TABLET DAILY X4 TO 7 DAYS     Psychiatry: Antidepressants - bupropion  Passed - 12/19/2023 11:20 AM      Passed - Cr in normal range and within 360 days    Creatinine  Date Value Ref Range Status  10/09/2011 0.59 (L) 0.60 - 1.30 mg/dL Final   Creatinine, Ser  Date Value Ref Range Status  11/27/2023 0.69 0.57 - 1.00 mg/dL Final         Passed - AST in normal range and within 360 days    AST  Date Value Ref Range Status  11/27/2023 14 0 - 40 IU/L Final   SGOT(AST)  Date Value Ref Range Status  10/09/2011 13 (L) 15 - 37 Unit/L Final         Passed - ALT in normal range and within 360 days    ALT  Date Value Ref Range Status  11/27/2023 12 0 - 32 IU/L Final   SGPT (ALT)  Date Value Ref Range Status  10/09/2011 17 12 - 78 U/L Final         Passed - Completed PHQ-2 or PHQ-9 in the last 360 days      Passed - Last BP in normal range    BP Readings from Last 1 Encounters:  11/27/23 119/84         Passed - Valid encounter within last 6 months    Recent Outpatient Visits           3 weeks ago Routine general medical examination at a health care facility   [facility] [place], [name], DO   1 month ago Lower abdominal pain   [facility] [place], Cozad A, MD   2 months ago Recurrent major depressive disorder, in partial remission   [facility] [place], [place] T, NP   3 months ago Recurrent major depressive disorder, in partial remission   [place] [facility] [place], [place], DO

## 2023-12-20 ENCOUNTER — Encounter: Payer: Self-pay | Admitting: Family Medicine

## 2024-01-09 ENCOUNTER — Telehealth: Admitting: Family Medicine

## 2024-01-09 VITALS — Ht 62.0 in | Wt 125.0 lb

## 2024-01-09 DIAGNOSIS — F419 Anxiety disorder, unspecified: Secondary | ICD-10-CM

## 2024-01-09 MED ORDER — SERTRALINE HCL 100 MG PO TABS: 150.0000 mg | ORAL_TABLET | Freq: Every morning | ORAL | 1 refills | Status: AC

## 2024-01-09 NOTE — Assessment & Plan Note (Signed)
 Went for neuropsych testing back in October- results not currently available. Will reach out to them to see if we can get a copy. Mood is doing well right now. Continue current dose of sertraline . May start low dose XR adderall pending review of neuropsych testing- await results.

## 2024-01-09 NOTE — Progress Notes (Signed)
 Ht 5' 2 (1.575 m)   Wt 125 lb (56.7 kg)   BMI 22.86 kg/m    Subjective:    Patient ID: Angela [name], female    DOB: 03-26-1985, 38 y.o.   MRN: [id number]  HPI: Angela [name] is a 38 y.o. female  Chief Complaint  Patient presents with   Depression    Pt states that she is feeling better with the increase of Zoloft     ADHD    Would like to start meds. Dx by psych    DEPRESSION Mood status: stable Satisfied with current treatment?: no Symptom severity: mild  Duration of current treatment : chronic Side effects: no Medication compliance: excellent compliance Psychotherapy/counseling: no  Previous psychiatric medications: sertraline , wellbutrin  Depressed mood: no Anxious mood: yes Anhedonia: no Significant weight loss or gain: no Insomnia: no  Fatigue: no Feelings of worthlessness or guilt: no Impaired concentration/indecisiveness: no Suicidal ideations: no Hopelessness: no Crying spells: no    01/09/2024   11:10 AM 11/27/2023    9:37 AM 11/03/2023    2:26 PM 10/18/2023    3:52 PM 11/29/2022    4:18 PM  Depression screen PHQ 2/9  Decreased Interest 0 0 0 0 0  Down, Depressed, Hopeless 0 1 1 1  0  PHQ - 2 Score 0 1 1 1  0  Altered sleeping 1 0 0 1 0  Tired, decreased energy 3 2 1 3 1   Change in appetite 0 0 0 1 0  Feeling bad or failure about yourself  0 0 0 0 0  Trouble concentrating 3 3 3 3  0  Moving slowly or fidgety/restless 3 3 0 3 0  Suicidal thoughts 0 0 0 0 0  PHQ-9 Score 10 9  5  12  1    Difficult doing work/chores Somewhat difficult Somewhat difficult Somewhat difficult Very difficult Not difficult at all     Data saved with a previous flowsheet row definition    Relevant past medical, surgical, family and social history reviewed and updated as indicated. Interim medical history since our last visit reviewed. Allergies and medications reviewed and updated.  Review of Systems  Constitutional: Negative.   Respiratory: Negative.     Cardiovascular: Negative.   Musculoskeletal: Negative.   Psychiatric/Behavioral:  Positive for decreased concentration. Negative for agitation, behavioral problems, confusion, dysphoric mood, hallucinations, self-injury, sleep disturbance and suicidal ideas. The patient is nervous/anxious. The patient is not hyperactive.     Per HPI unless specifically indicated above     Objective:    Ht 5' 2 (1.575 m)   Wt 125 lb (56.7 kg)   BMI 22.86 kg/m   Wt Readings from Last 3 Encounters:  01/09/24 125 lb (56.7 kg)  11/27/23 126 lb (57.2 kg)  11/03/23 126 lb 9.6 oz (57.4 kg)    Physical Exam Vitals and nursing note reviewed.  Constitutional:      General: She is not in acute distress.    Appearance: Normal appearance. She is not ill-appearing, toxic-appearing or diaphoretic.  HENT:     Head: Normocephalic and atraumatic.     Right Ear: External ear normal.     Left Ear: External ear normal.     Nose: Nose normal.     Mouth/Throat:     Mouth: Mucous membranes are moist.     Pharynx: Oropharynx is clear.  Eyes:     General: No scleral icterus.       Right eye: No discharge.  Left eye: No discharge.     Conjunctiva/sclera: Conjunctivae normal.     Pupils: Pupils are equal, round, and reactive to light.  Pulmonary:     Effort: Pulmonary effort is normal. No respiratory distress.     Comments: Speaking in full sentences Musculoskeletal:        General: Normal range of motion.     Cervical back: Normal range of motion.  Skin:    Coloration: Skin is not jaundiced or pale.     Findings: No bruising, erythema, lesion or rash.  Neurological:     Mental Status: She is alert and oriented to person, place, and time. Mental status is at baseline.  Psychiatric:        Mood and Affect: Mood normal.        Behavior: Behavior normal.        Thought Content: Thought content normal.        Judgment: Judgment normal.     Results for orders placed or performed in visit on 11/27/23   Cytology - PAP   Collection Time: 11/27/23 10:08 AM  Result Value Ref Range   High risk HPV Negative    Adequacy      Satisfactory for evaluation; transformation zone component ABSENT.   Diagnosis      - Negative for intraepithelial lesion or malignancy (NILM)   Comment Normal Reference Range HPV - Negative   CBC with Differential/Platelet   Collection Time: 11/27/23 10:14 AM  Result Value Ref Range   WBC 2.7 (L) 3.4 - 10.8 x10E3/uL   RBC 4.90 3.77 - 5.28 x10E6/uL   Hemoglobin 12.8 11.1 - 15.9 g/dL   Hematocrit 57.7 65.9 - 46.6 %   MCV 86 79 - 97 fL   MCH 26.1 (L) 26.6 - 33.0 pg   MCHC 30.3 (L) 31.5 - 35.7 g/dL   RDW 86.3 88.2 - 84.5 %   Platelets 206 150 - 450 x10E3/uL   Neutrophils 59 Not Estab. %   Lymphs 32 Not Estab. %   Monocytes 8 Not Estab. %   Eos 0 Not Estab. %   Basos 1 Not Estab. %   Neutrophils Absolute 1.6 1.4 - 7.0 x10E3/uL   Lymphocytes Absolute 0.9 0.7 - 3.1 x10E3/uL   Monocytes Absolute 0.2 0.1 - 0.9 x10E3/uL   EOS (ABSOLUTE) 0.0 0.0 - 0.4 x10E3/uL   Basophils Absolute 0.0 0.0 - 0.2 x10E3/uL   Immature Granulocytes 0 Not Estab. %   Immature Grans (Abs) 0.0 0.0 - 0.1 x10E3/uL  Comprehensive metabolic panel with GFR   Collection Time: 11/27/23 10:14 AM  Result Value Ref Range   Glucose 85 70 - 99 mg/dL   BUN 8 6 - 20 mg/dL   Creatinine, Ser 9.30 0.57 - 1.00 mg/dL   eGFR 884 >40 fO/fpw/8.26   BUN/Creatinine Ratio 12 9 - 23   Sodium 140 134 - 144 mmol/L   Potassium 4.2 3.5 - 5.2 mmol/L   Chloride 104 96 - 106 mmol/L   CO2 23 20 - 29 mmol/L   Calcium 9.8 8.7 - 10.2 mg/dL   Total Protein 6.8 6.0 - 8.5 g/dL   Albumin 4.4 3.9 - 4.9 g/dL   Globulin, Total 2.4 1.5 - 4.5 g/dL   Bilirubin Total 0.2 0.0 - 1.2 mg/dL   Alkaline Phosphatase 57 41 - 116 IU/L   AST 14 0 - 40 IU/L   ALT 12 0 - 32 IU/L  Lipid Panel w/o Chol/HDL Ratio   Collection Time: 11/27/23 10:14 AM  Result Value Ref Range   Cholesterol, Total 185 100 - 199 mg/dL   Triglycerides 878 0 -  149 mg/dL   HDL 48 >60 mg/dL   VLDL Cholesterol Cal 22 5 - 40 mg/dL   LDL Chol Calc (NIH) 884 (H) 0 - 99 mg/dL  TSH   Collection Time: 11/27/23 10:14 AM  Result Value Ref Range   TSH 2.010 0.450 - 4.500 uIU/mL  Hepatitis B surface antibody,quantitative   Collection Time: 11/27/23 10:14 AM  Result Value Ref Range   Hepatitis B Surf Ab Quant 180.0 Immunity>10 mIU/mL      Assessment & Plan:   Problem List Items Addressed This Visit       Other   Anxiety - Primary   Went for neuropsych testing back in October- results not currently available. Will reach out to them to see if we can get a copy. Mood is doing well right now. Continue current dose of sertraline . May start low dose XR adderall pending review of neuropsych testing- await results.       Relevant Medications   sertraline  (ZOLOFT ) 100 MG tablet     Follow up plan: Return in about 4 weeks (around 02/06/2024) for virtual OK.    This visit was completed via video visit through MyChart due to the restrictions of the COVID-19 pandemic. All issues as above were discussed and addressed. Physical exam was done as above through visual confirmation on video through MyChart. If it was felt that the patient should be evaluated in the office, they were directed there. The patient verbally consented to this visit. Location of the patient: work Location of the provider: work Those involved with this call:  Provider: Duwaine Louder, DO CMA: York Fogo, CMA, Front Desk/Registration: Claretta Maiden  Time spent on call: 15 minutes with patient face to face via video conference. More than 50% of this time was spent in counseling and coordination of care. 23 minutes total spent in review of patient's record and preparation of their chart.

## 2024-01-17 ENCOUNTER — Other Ambulatory Visit: Payer: Self-pay | Admitting: Family Medicine

## 2024-01-17 DIAGNOSIS — N83209 Unspecified ovarian cyst, unspecified side: Secondary | ICD-10-CM

## 2024-01-17 NOTE — Progress Notes (Signed)
   NEUROPSYCHOLOGICAL EVALUATION [place] [employer]  Physical Medicine and Rehabilitation     Patient: Angela [name]  MRN: [id number] DOB: 1985/09/08   Service Provider/Clinical Neuropsychologist: Evalene DOROTHA Riff, PsyD  Date of Service: 01/17/24 Start Time: 3 PM End Time: 4 PM  Location of Service:  [facility] Physical Medicine & Rehabilitation Department [place]. [facility] [address] [place], [place] [address] Phone: [phone]   Billing Code/Service: [phone]    Individuals present: Patient, Provider Laurier DOROTHA Riff, PsyD)  Provider conducted the 60-minute interactive feedback appointment in-person with the patient.  The provider reviewed and discussed the results of neuropsychological evaluation. Follow-up interviewing was conducted as needed to refine interpretation of findings as needed. Review of results included overall findings, diagnosis, and treatment planning/recommendations that were derived from integration of patient data, interpretation of standardized rest results and clinical data, and clinical decision making, which are documented in the patient's electronic medical record with the full report (date listed below). A copy of the full report will also be mailed to the patient.   The patient expressed understanding of the information reviewed. The patient was provided opportunity to ask questions which were then answered by the provider. The provider worked collaboratively to tailor treatment recommendations to the patient when possible. The patient was informed they could reach out to the provider should additional questions related to the evaluation arise.    The final neuropsychological evaluation report, documented in the patient's chart DATE 11/29/23, was amended to reflect any additional information obtained during the feedback appointment including treatment planning collaboration.    Evalene DOROTHA Riff, PsyD [facility]-Clinical Neuropsychology [address] [place], [place] [address] Main: [phone] Fax: [phone] [place] License # [id number]  This report was generated using voice recognition software. While this document has been carefully reviewed, transcription errors may be present. I apologize in advance for any inconvenience. Please contact me if further clarification is needed.

## 2024-01-26 ENCOUNTER — Telehealth: Payer: Self-pay

## 2024-01-26 NOTE — Telephone Encounter (Signed)
 Called patient to inform we have not received the formal results yet at this time so she is aware. I have called and requested again from the [facility] Physical Medicine and Rehabilitation referral coordinator Justice but had to leave a message. Patient will try sending the results through Mychart and if received will be forwarded to her PCP.

## 2024-01-26 NOTE — Telephone Encounter (Signed)
 PM&R said they faxed over her neuropsych testing- but I still don't have it- can we call and have them fax it again? We discussed low dose adderall, but I need the notes first.

## 2024-01-26 NOTE — Telephone Encounter (Signed)
 Copied from CRM #[id number]. Topic: Clinical - Medical Advice >> Jan 26, 2024  8:22 AM Angela [name] wrote: Reason for CRM: Patient called and would like to know what is the name of medication for her Adhd because the pharmacy does not have it-please contact patient

## 2024-01-29 ENCOUNTER — Ambulatory Visit

## 2024-01-29 NOTE — Telephone Encounter (Signed)
 Can we please print these so they can be put in the correct area of the chart?

## 2024-01-29 NOTE — Telephone Encounter (Signed)
 Note printed and placed in providers folder to review for medication.

## 2024-01-29 NOTE — Telephone Encounter (Signed)
 Copied from CRM #[id number]. Topic: Clinical - Prescription Issue >> Jan 29, 2024 12:02 PM Leonette SQUIBB wrote: Reason for CRM: patient called saying she sent pictures through my chart of the reports so Dr. Vicci can send in her Adderall to the pharmacy.

## 2024-01-31 ENCOUNTER — Other Ambulatory Visit: Payer: Self-pay | Admitting: Family Medicine

## 2024-01-31 NOTE — Telephone Encounter (Unsigned)
 Copied from CRM #[id number]. Topic: Clinical - Medication Refill >> Jan 31, 2024  3:01 PM Rachelle R wrote: Medication: Norethindrone  Acetate-Ethinyl Estradiol  (JUNEL 1.5/30) 1.5-30 MG-MCG tablet  Has the patient contacted their pharmacy? Yes, call dr  This is the patient's preferred pharmacy:  [id number] - [place], [place] - [address] [place] [address] Phone: [phone] Fax: [phone]  Is this the correct pharmacy for this prescription? Yes  Has the prescription been filled recently? No  Is the patient out of the medication? Yes  Has the patient been seen for an appointment in the last year OR does the patient have an upcoming appointment? Yes  Can we respond through MyChart? Yes  Agent: Please be advised that Rx refills may take up to 3 business days. We ask that you follow-up with your pharmacy.

## 2024-02-02 ENCOUNTER — Other Ambulatory Visit: Payer: Self-pay | Admitting: Family Medicine

## 2024-02-02 DIAGNOSIS — F902 Attention-deficit hyperactivity disorder, combined type: Secondary | ICD-10-CM | POA: Insufficient documentation

## 2024-02-02 MED ORDER — AMPHETAMINE-DEXTROAMPHETAMINE 5 MG PO TABS: 5.0000 mg | ORAL_TABLET | Freq: Two times a day (BID) | ORAL | 0 refills | Status: DC

## 2024-02-02 NOTE — Telephone Encounter (Unsigned)
 Copied from CRM #[id number]. Topic: Clinical - Medication Question >> Feb 02, 2024 11:43 AM Roselie BROCKS wrote: Reason for CRM: Patient states she was having to wait until the psychiatrist approved ADHD meds to get a script. Patent states she has sent the documents and is requesting a script for ADHD be sent to [facility] in [place]

## 2024-02-02 NOTE — Telephone Encounter (Signed)
 Scheduled

## 2024-02-03 MED ORDER — NORETHINDRONE ACET-ETHINYL EST 1.5-30 MG-MCG PO TABS: 1.0000 | ORAL_TABLET | Freq: Every day | ORAL | 0 refills | Status: AC

## 2024-02-03 NOTE — Telephone Encounter (Signed)
 Requested Prescriptions  Pending Prescriptions Disp Refills   Norethindrone  Acetate-Ethinyl Estradiol  (JUNEL 1.5/30) 1.5-30 MG-MCG tablet 84 tablet 0    Sig: Take 1 tablet by mouth daily.     OB/GYN:  Contraceptives Passed - 02/03/2024  7:07 AM      Passed - Last BP in normal range    BP Readings from Last 1 Encounters:  11/27/23 119/84         Passed - Valid encounter within last 12 months    Recent Outpatient Visits           3 weeks ago Anxiety   [place] [facility] [place], [place] P, DO   2 months ago Routine general medical examination at a health care facility   [facility], [name], DO   3 months ago Lower abdominal pain   [facility] [place], [place] A, MD   3 months ago Recurrent major depressive disorder, in partial remission   [place] [facility] [place], [place] T, NP   5 months ago Recurrent major depressive disorder, in partial remission   [facility] [place], [place], [place]              Passed - Patient is not a smoker

## 2024-02-06 ENCOUNTER — Telehealth: Payer: Self-pay | Admitting: Pharmacy Technician

## 2024-02-06 ENCOUNTER — Other Ambulatory Visit (HOSPITAL_COMMUNITY): Payer: Self-pay

## 2024-02-06 NOTE — Telephone Encounter (Signed)
 Pharmacy Patient Advocate Encounter  Received notification from [facility] that Prior Authorization for Amphetamine -Dextroamphetamine  5MG  tablets has been APPROVED from 02/06/24 to 02/05/27. Ran test claim, Copay is $14.68. This test claim was processed through [facility]- copay amounts may vary at other pharmacies due to pharmacy/plan contracts, or as the patient moves through the different stages of their insurance plan.   PA #/Case ID/Reference #:  [id number]

## 2024-02-06 NOTE — Telephone Encounter (Signed)
 Pharmacy Patient Advocate Encounter   Received notification from [employer] that prior authorization for Amphetamine -Dextroamphetamine  5MG  tablets is required/requested.   Insurance verification completed.   The patient is insured through [facility].   Per test claim: PA required; PA started via CoverMyMeds. KEY [id number] . Waiting for clinical questions to populate.

## 2024-02-15 ENCOUNTER — Encounter: Payer: Self-pay | Admitting: Family Medicine

## 2024-02-19 ENCOUNTER — Ambulatory Visit: Admitting: Family Medicine

## 2024-03-04 ENCOUNTER — Ambulatory Visit (INDEPENDENT_AMBULATORY_CARE_PROVIDER_SITE_OTHER): Admitting: Family Medicine

## 2024-03-04 ENCOUNTER — Encounter: Payer: Self-pay | Admitting: Family Medicine

## 2024-03-04 VITALS — BP 118/80 | HR 83 | Temp 98.1°F | Ht 62.0 in | Wt 128.2 lb

## 2024-03-04 DIAGNOSIS — N750 Cyst of Bartholin's gland: Secondary | ICD-10-CM | POA: Diagnosis not present

## 2024-03-04 DIAGNOSIS — F902 Attention-deficit hyperactivity disorder, combined type: Secondary | ICD-10-CM

## 2024-03-04 MED ORDER — SULFAMETHOXAZOLE-TRIMETHOPRIM 800-160 MG PO TABS: 1.0000 | ORAL_TABLET | Freq: Two times a day (BID) | ORAL | 0 refills | Status: AC

## 2024-03-04 MED ORDER — AMPHETAMINE-DEXTROAMPHETAMINE 10 MG PO TABS: 10.0000 mg | ORAL_TABLET | Freq: Two times a day (BID) | ORAL | 0 refills | Status: DC

## 2024-03-04 NOTE — Assessment & Plan Note (Signed)
 Not working quite long enough. Will change to 10mg  adderall and recheck in about a month.

## 2024-03-04 NOTE — Progress Notes (Signed)
 "  BP 118/80   Pulse 83   Temp 98.1 F (36.7 C) (Oral)   Ht 5' 2 (1.575 m)   Wt 128 lb 3.2 oz (58.2 kg)   SpO2 98%   BMI 23.45 kg/m    Subjective:    Patient ID: Angela [name], female    DOB: [birth year], 39 y.o.   MRN: [id number]  HPI: Angela [name] is a 39 y.o. female  Chief Complaint  Patient presents with   ADHD    Started 1/3.  Started off great really felt a difference. Then about 2 weeks later has started to feel like it was less effective now.  Would like to increase dose.    ADHD FOLLOW UP- she is noticing it wearing off after a couple of hours ADHD status: better Satisfied with current therapy: no Medication compliance:  excellent compliance Controlled substance contract: yes Previous psychiatry evaluation: no Previous medications: no    Taking meds on weekends/vacations: yes Work/school performance:  good Difficulty sustaining attention/completing tasks: yes Distracted by extraneous stimuli: yes Does not listen when spoken to: yes  Fidgets with hands or feet: no Unable to stay in seat: no Blurts out/interrupts others: no ADHD Medication Side Effects: no    Decreased appetite: no    Headache: no    Sleeping disturbance pattern: no    Irritability: no    Rebound effects (worse than baseline) off medication: no    Anxiousness: no    Dizziness: no    Tics: no  LUMP Duration: couple of days Location: labia Onset: sudden Painful: yes Discomfort: yes Status:  bigger Trauma: no Redness: no Bruising: no Recent infection: no Swollen lymph nodes: no Requesting removal: no History of cancer: no Family history of cancer: no History of the same: no  Relevant past medical, surgical, family and social history reviewed and updated as indicated. Interim medical history since our last visit reviewed. Allergies and medications reviewed and updated.  Review of Systems  Constitutional: Negative.   Respiratory: Negative.    Cardiovascular: Negative.    Musculoskeletal: Negative.   Neurological: Negative.   Psychiatric/Behavioral: Negative.      Per HPI unless specifically indicated above     Objective:    BP 118/80   Pulse 83   Temp 98.1 F (36.7 C) (Oral)   Ht 5' 2 (1.575 m)   Wt 128 lb 3.2 oz (58.2 kg)   SpO2 98%   BMI 23.45 kg/m   Wt Readings from Last 3 Encounters:  03/04/24 128 lb 3.2 oz (58.2 kg)  01/09/24 125 lb (56.7 kg)  11/27/23 126 lb (57.2 kg)    Physical Exam Vitals and nursing note reviewed.  Constitutional:      General: She is not in acute distress.    Appearance: Normal appearance. She is not ill-appearing, toxic-appearing or diaphoretic.  HENT:     Head: Normocephalic and atraumatic.     Right Ear: External ear normal.     Left Ear: External ear normal.     Nose: Nose normal.     Mouth/Throat:     Mouth: Mucous membranes are moist.     Pharynx: Oropharynx is clear.  Eyes:     General: No scleral icterus.       Right eye: No discharge.        Left eye: No discharge.     Extraocular Movements: Extraocular movements intact.     Conjunctiva/sclera: Conjunctivae normal.     Pupils: Pupils are equal, round,  and reactive to light.  Cardiovascular:     Rate and Rhythm: Normal rate and regular rhythm.     Pulses: Normal pulses.     Heart sounds: Normal heart sounds. No murmur heard.    No friction rub. No gallop.  Pulmonary:     Effort: Pulmonary effort is normal. No respiratory distress.     Breath sounds: Normal breath sounds. No stridor. No wheezing, rhonchi or rales.  Chest:     Chest wall: No tenderness.  Genitourinary:    Comments: Swollen bartholin's gland cyst L labia Musculoskeletal:        General: Normal range of motion.     Cervical back: Normal range of motion and neck supple.  Skin:    General: Skin is warm and dry.     Capillary Refill: Capillary refill takes less than 2 seconds.     Coloration: Skin is not jaundiced or pale.     Findings: No bruising, erythema, lesion or  rash.  Neurological:     General: No focal deficit present.     Mental Status: She is alert and oriented to person, place, and time. Mental status is at baseline.  Psychiatric:        Mood and Affect: Mood normal.        Behavior: Behavior normal.        Thought Content: Thought content normal.        Judgment: Judgment normal.     Results for orders placed or performed in visit on 11/27/23  Cytology - PAP   Collection Time: 11/27/23 10:08 AM  Result Value Ref Range   High risk HPV Negative    Adequacy      Satisfactory for evaluation; transformation zone component ABSENT.   Diagnosis      - Negative for intraepithelial lesion or malignancy (NILM)   Comment Normal Reference Range HPV - Negative   CBC with Differential/Platelet   Collection Time: 11/27/23 10:14 AM  Result Value Ref Range   WBC 2.7 (L) 3.4 - 10.8 x10E3/uL   RBC 4.90 3.77 - 5.28 x10E6/uL   Hemoglobin 12.8 11.1 - 15.9 g/dL   Hematocrit 57.7 65.9 - 46.6 %   MCV 86 79 - 97 fL   MCH 26.1 (L) 26.6 - 33.0 pg   MCHC 30.3 (L) 31.5 - 35.7 g/dL   RDW 86.3 88.2 - 84.5 %   Platelets 206 150 - 450 x10E3/uL   Neutrophils 59 Not Estab. %   Lymphs 32 Not Estab. %   Monocytes 8 Not Estab. %   Eos 0 Not Estab. %   Basos 1 Not Estab. %   Neutrophils Absolute 1.6 1.4 - 7.0 x10E3/uL   Lymphocytes Absolute 0.9 0.7 - 3.1 x10E3/uL   Monocytes Absolute 0.2 0.1 - 0.9 x10E3/uL   EOS (ABSOLUTE) 0.0 0.0 - 0.4 x10E3/uL   Basophils Absolute 0.0 0.0 - 0.2 x10E3/uL   Immature Granulocytes 0 Not Estab. %   Immature Grans (Abs) 0.0 0.0 - 0.1 x10E3/uL  Comprehensive metabolic panel with GFR   Collection Time: 11/27/23 10:14 AM  Result Value Ref Range   Glucose 85 70 - 99 mg/dL   BUN 8 6 - 20 mg/dL   Creatinine, Ser 9.30 0.57 - 1.00 mg/dL   eGFR 884 >40 fO/fpw/8.26   BUN/Creatinine Ratio 12 9 - 23   Sodium 140 134 - 144 mmol/L   Potassium 4.2 3.5 - 5.2 mmol/L   Chloride 104 96 - 106 mmol/L   CO2 23  20 - 29 mmol/L   Calcium 9.8 8.7  - 10.2 mg/dL   Total Protein 6.8 6.0 - 8.5 g/dL   Albumin 4.4 3.9 - 4.9 g/dL   Globulin, Total 2.4 1.5 - 4.5 g/dL   Bilirubin Total 0.2 0.0 - 1.2 mg/dL   Alkaline Phosphatase 57 41 - 116 IU/L   AST 14 0 - 40 IU/L   ALT 12 0 - 32 IU/L  Lipid Panel w/o Chol/HDL Ratio   Collection Time: 11/27/23 10:14 AM  Result Value Ref Range   Cholesterol, Total 185 100 - 199 mg/dL   Triglycerides 878 0 - 149 mg/dL   HDL 48 >60 mg/dL   VLDL Cholesterol Cal 22 5 - 40 mg/dL   LDL Chol Calc (NIH) 884 (H) 0 - 99 mg/dL  TSH   Collection Time: 11/27/23 10:14 AM  Result Value Ref Range   TSH 2.010 0.450 - 4.500 uIU/mL  Hepatitis B surface antibody,quantitative   Collection Time: 11/27/23 10:14 AM  Result Value Ref Range   Hepatitis B Surf Ab Quant 180.0 Immunity>10 mIU/mL      Assessment & Plan:   Problem List Items Addressed This Visit       Other   ADHD (attention deficit hyperactivity disorder), combined type - Primary   Not working quite long enough. Will change to 10mg  adderall and recheck in about a month.      Other Visit Diagnoses       Bartholin gland cyst       Will treat with bactrim . Call with any concerns. Continue to monitor.        Follow up plan: Return in about 4 weeks (around 04/01/2024) for virtual OK.      "

## 2024-03-10 ENCOUNTER — Encounter: Payer: Self-pay | Admitting: Family Medicine

## 2024-03-22 ENCOUNTER — Ambulatory Visit: Admitting: Family Medicine

## 2024-03-22 ENCOUNTER — Encounter: Payer: Self-pay | Admitting: Family Medicine

## 2024-03-22 VITALS — BP 116/76 | HR 81 | Temp 98.1°F | Resp 16 | Ht 62.0 in | Wt 127.0 lb

## 2024-03-22 DIAGNOSIS — F902 Attention-deficit hyperactivity disorder, combined type: Secondary | ICD-10-CM

## 2024-03-22 DIAGNOSIS — H938X3 Other specified disorders of ear, bilateral: Secondary | ICD-10-CM

## 2024-03-22 MED ORDER — AMPHETAMINE-DEXTROAMPHET ER 5 MG PO CP24: 5.0000 mg | ORAL_CAPSULE | Freq: Every day | ORAL | 0 refills | Status: AC

## 2024-03-22 NOTE — Addendum Note (Signed)
 Addended by: VICCI DUWAINE SQUIBB on: 03/22/2024 09:27 AM   Modules accepted: Level of Service

## 2024-03-22 NOTE — Assessment & Plan Note (Signed)
 Irritable on the 10mg  of the adderall short acting. Will change to extended release 5mg  and recheck in about a month. Call with any concerns.

## 2024-03-22 NOTE — Progress Notes (Addendum)
 "  BP 116/76 (BP Location: Left Arm, Patient Position: Sitting, Cuff Size: Normal)   Pulse 81   Temp 98.1 F (36.7 C) (Oral)   Resp 16   Ht 5' 2 (1.575 m)   Wt 127 lb (57.6 kg)   SpO2 97%   BMI 23.23 kg/m    Subjective:    Patient ID: Angela [name], female    DOB: 1985-06-01, 39 y.o.   MRN: [id number]  HPI: Angela [name] is a 39 y.o. female  Chief Complaint  Patient presents with   ADHD    Not sure if she's adjusting ok to new med dosage. Was feeling slight high with out the ha ha  and being really agitated and on edge. Has not been able to take the 10mg  only has to cut it into quarters just felt like way to much.   Ear Fullness    Would like for he ears to be checked, woke up yesterday with vertigo, does not feel dizzy today    ADHD FOLLOW UP- felt like right before her period, she felt like she was much worse. She notes that she is feeling much better, she notes that she has not taken the whole 10mg  because she felt like it was too strong. She notes that she has been taking about 7.5mg  and has been having her medicine wearing off a couple of hours in  ADHD status: uncontrolled Satisfied with current therapy: no Medication compliance:  fair compliance Controlled substance contract: yes Previous psychiatry evaluation: no Previous medications: no    Taking meds on weekends/vacations: yes Work/school performance:  good Difficulty sustaining attention/completing tasks: no Distracted by extraneous stimuli: no Does not listen when spoken to: no  Fidgets with hands or feet: no Unable to stay in seat: no Blurts out/interrupts others: no ADHD Medication Side Effects: yes    Decreased appetite: no    Headache: no    Sleeping disturbance pattern: no    Irritability: yes    Rebound effects (worse than baseline) off medication: yes    Anxiousness: yes    Dizziness: no    Tics: no  EAG CLOGGED Duration: 1 day Involved ear(s):  bilateral Sensation of feeling  clogged/plugged: yes Decreased/muffled hearing:yes Ear pain: no Fever: no Otorrhea: no Hearing loss: no Upper respiratory infection symptoms: yes Using Q-Tips: no Status: better History of cerumenosis: no Treatments attempted: none   Relevant past medical, surgical, family and social history reviewed and updated as indicated. Interim medical history since our last visit reviewed. Allergies and medications reviewed and updated.  Review of Systems  Constitutional: Negative.   Respiratory: Negative.    Cardiovascular: Negative.   Psychiatric/Behavioral:  Positive for agitation and decreased concentration. Negative for behavioral problems, confusion, dysphoric mood, hallucinations, self-injury, sleep disturbance and suicidal ideas. The patient is nervous/anxious. The patient is not hyperactive.     Per HPI unless specifically indicated above     Objective:    BP 116/76 (BP Location: Left Arm, Patient Position: Sitting, Cuff Size: Normal)   Pulse 81   Temp 98.1 F (36.7 C) (Oral)   Resp 16   Ht 5' 2 (1.575 m)   Wt 127 lb (57.6 kg)   SpO2 97%   BMI 23.23 kg/m   Wt Readings from Last 3 Encounters:  03/22/24 127 lb (57.6 kg)  03/04/24 128 lb 3.2 oz (58.2 kg)  01/09/24 125 lb (56.7 kg)    Physical Exam Vitals and nursing note reviewed.  Constitutional:  General: She is not in acute distress.    Appearance: Normal appearance. She is not ill-appearing, toxic-appearing or diaphoretic.  HENT:     Head: Normocephalic and atraumatic.     Right Ear: Tympanic membrane, ear canal and external ear normal.     Left Ear: Tympanic membrane, ear canal and external ear normal.     Nose: Nose normal. No congestion or rhinorrhea.     Mouth/Throat:     Mouth: Mucous membranes are moist.     Pharynx: Oropharynx is clear.  Eyes:     General: No scleral icterus.       Right eye: No discharge.        Left eye: No discharge.     Extraocular Movements: Extraocular movements intact.      Conjunctiva/sclera: Conjunctivae normal.     Pupils: Pupils are equal, round, and reactive to light.  Cardiovascular:     Rate and Rhythm: Normal rate and regular rhythm.     Pulses: Normal pulses.     Heart sounds: Normal heart sounds. No murmur heard.    No friction rub. No gallop.  Pulmonary:     Effort: Pulmonary effort is normal. No respiratory distress.     Breath sounds: Normal breath sounds. No stridor. No wheezing, rhonchi or rales.  Chest:     Chest wall: No tenderness.  Musculoskeletal:        General: Normal range of motion.     Cervical back: Normal range of motion and neck supple.  Skin:    General: Skin is warm and dry.     Capillary Refill: Capillary refill takes less than 2 seconds.     Coloration: Skin is not jaundiced or pale.     Findings: No bruising, erythema, lesion or rash.  Neurological:     General: No focal deficit present.     Mental Status: She is alert and oriented to person, place, and time. Mental status is at baseline.  Psychiatric:        Mood and Affect: Mood normal.        Behavior: Behavior normal.        Thought Content: Thought content normal.        Judgment: Judgment normal.     Results for orders placed or performed in visit on 11/27/23  Cytology - PAP   Collection Time: 11/27/23 10:08 AM  Result Value Ref Range   High risk HPV Negative    Adequacy      Satisfactory for evaluation; transformation zone component ABSENT.   Diagnosis      - Negative for intraepithelial lesion or malignancy (NILM)   Comment Normal Reference Range HPV - Negative   CBC with Differential/Platelet   Collection Time: 11/27/23 10:14 AM  Result Value Ref Range   WBC 2.7 (L) 3.4 - 10.8 x10E3/uL   RBC 4.90 3.77 - 5.28 x10E6/uL   Hemoglobin 12.8 11.1 - 15.9 g/dL   Hematocrit 57.7 65.9 - 46.6 %   MCV 86 79 - 97 fL   MCH 26.1 (L) 26.6 - 33.0 pg   MCHC 30.3 (L) 31.5 - 35.7 g/dL   RDW 86.3 88.2 - 84.5 %   Platelets 206 150 - 450 x10E3/uL   Neutrophils 59  Not Estab. %   Lymphs 32 Not Estab. %   Monocytes 8 Not Estab. %   Eos 0 Not Estab. %   Basos 1 Not Estab. %   Neutrophils Absolute 1.6 1.4 - 7.0 x10E3/uL  Lymphocytes Absolute 0.9 0.7 - 3.1 x10E3/uL   Monocytes Absolute 0.2 0.1 - 0.9 x10E3/uL   EOS (ABSOLUTE) 0.0 0.0 - 0.4 x10E3/uL   Basophils Absolute 0.0 0.0 - 0.2 x10E3/uL   Immature Granulocytes 0 Not Estab. %   Immature Grans (Abs) 0.0 0.0 - 0.1 x10E3/uL  Comprehensive metabolic panel with GFR   Collection Time: 11/27/23 10:14 AM  Result Value Ref Range   Glucose 85 70 - 99 mg/dL   BUN 8 6 - 20 mg/dL   Creatinine, Ser 9.30 0.57 - 1.00 mg/dL   eGFR 884 >40 fO/fpw/8.26   BUN/Creatinine Ratio 12 9 - 23   Sodium 140 134 - 144 mmol/L   Potassium 4.2 3.5 - 5.2 mmol/L   Chloride 104 96 - 106 mmol/L   CO2 23 20 - 29 mmol/L   Calcium 9.8 8.7 - 10.2 mg/dL   Total Protein 6.8 6.0 - 8.5 g/dL   Albumin 4.4 3.9 - 4.9 g/dL   Globulin, Total 2.4 1.5 - 4.5 g/dL   Bilirubin Total 0.2 0.0 - 1.2 mg/dL   Alkaline Phosphatase 57 41 - 116 IU/L   AST 14 0 - 40 IU/L   ALT 12 0 - 32 IU/L  Lipid Panel w/o Chol/HDL Ratio   Collection Time: 11/27/23 10:14 AM  Result Value Ref Range   Cholesterol, Total 185 100 - 199 mg/dL   Triglycerides 878 0 - 149 mg/dL   HDL 48 >60 mg/dL   VLDL Cholesterol Cal 22 5 - 40 mg/dL   LDL Chol Calc (NIH) 884 (H) 0 - 99 mg/dL  TSH   Collection Time: 11/27/23 10:14 AM  Result Value Ref Range   TSH 2.010 0.450 - 4.500 uIU/mL  Hepatitis B surface antibody,quantitative   Collection Time: 11/27/23 10:14 AM  Result Value Ref Range   Hepatitis B Surf Ab Quant 180.0 Immunity>10 mIU/mL      Assessment & Plan:   Problem List Items Addressed This Visit       Other   ADHD (attention deficit hyperactivity disorder), combined type - Primary   Irritable on the 10mg  of the adderall short acting. Will change to extended release 5mg  and recheck in about a month. Call with any concerns.       Other Visit Diagnoses        Sensation of fullness in both ears       No sign of infection. Can use flonase. Call if getting worse. Epley's manuvers given.        Follow up plan: Return in about 4 weeks (around 04/19/2024) for virtual OK.      "

## 2024-04-18 ENCOUNTER — Ambulatory Visit: Admitting: Family Medicine

## 2024-04-19 ENCOUNTER — Ambulatory Visit: Admitting: Family Medicine
